# Patient Record
Sex: Female | Born: 1935 | Race: Black or African American | Hispanic: No | Marital: Married | Smoking: Current every day smoker
Health system: Southern US, Community
[De-identification: ages and names within clinical notes are randomized; demographics above are authoritative.]

## PROBLEM LIST (undated history)

## (undated) DIAGNOSIS — F32A Depression, unspecified: Secondary | ICD-10-CM

## (undated) DIAGNOSIS — I2510 Atherosclerotic heart disease of native coronary artery without angina pectoris: Secondary | ICD-10-CM

## (undated) DIAGNOSIS — K219 Gastro-esophageal reflux disease without esophagitis: Secondary | ICD-10-CM

## (undated) DIAGNOSIS — R9389 Abnormal findings on diagnostic imaging of other specified body structures: Secondary | ICD-10-CM

## (undated) DIAGNOSIS — K529 Noninfective gastroenteritis and colitis, unspecified: Secondary | ICD-10-CM

## (undated) DIAGNOSIS — G4700 Insomnia, unspecified: Principal | ICD-10-CM

## (undated) DIAGNOSIS — R1310 Dysphagia, unspecified: Secondary | ICD-10-CM

## (undated) DIAGNOSIS — F419 Anxiety disorder, unspecified: Secondary | ICD-10-CM

## (undated) DIAGNOSIS — K648 Other hemorrhoids: Secondary | ICD-10-CM

## (undated) DIAGNOSIS — J029 Acute pharyngitis, unspecified: Secondary | ICD-10-CM

## (undated) DIAGNOSIS — D472 Monoclonal gammopathy: Secondary | ICD-10-CM

## (undated) DIAGNOSIS — D649 Anemia, unspecified: Secondary | ICD-10-CM

## (undated) DIAGNOSIS — K589 Irritable bowel syndrome without diarrhea: Secondary | ICD-10-CM

## (undated) DIAGNOSIS — E538 Deficiency of other specified B group vitamins: Secondary | ICD-10-CM

## (undated) DIAGNOSIS — E785 Hyperlipidemia, unspecified: Secondary | ICD-10-CM

## (undated) DIAGNOSIS — I10 Essential (primary) hypertension: Secondary | ICD-10-CM

## (undated) DIAGNOSIS — F329 Major depressive disorder, single episode, unspecified: Secondary | ICD-10-CM

## (undated) DIAGNOSIS — M1990 Unspecified osteoarthritis, unspecified site: Secondary | ICD-10-CM

## (undated) DIAGNOSIS — R591 Generalized enlarged lymph nodes: Principal | ICD-10-CM

## (undated) DIAGNOSIS — N3941 Urge incontinence: Secondary | ICD-10-CM

## (undated) DIAGNOSIS — K2970 Gastritis, unspecified, without bleeding: Secondary | ICD-10-CM

## (undated) DIAGNOSIS — C8290 Follicular lymphoma, unspecified, unspecified site: Principal | ICD-10-CM

## (undated) HISTORY — DX: Generalized enlarged lymph nodes: R59.1

## (undated) HISTORY — DX: Noninfective gastroenteritis and colitis, unspecified: K52.9

## (undated) HISTORY — DX: Acute pharyngitis, unspecified: J02.9

## (undated) HISTORY — PX: CORONARY STENT PLACEMENT: SHX1402

## (undated) HISTORY — DX: Unspecified osteoarthritis, unspecified site: M19.90

## (undated) HISTORY — DX: Depression, unspecified: F32.A

## (undated) HISTORY — DX: Abnormal findings on diagnostic imaging of other specified body structures: R93.89

## (undated) HISTORY — DX: Essential (primary) hypertension: I10

## (undated) HISTORY — DX: Anxiety disorder, unspecified: F41.9

## (undated) HISTORY — PX: SPINE SURGERY: SHX786

## (undated) HISTORY — PX: HEMICOLECTOMY: SHX854

## (undated) HISTORY — DX: Monoclonal gammopathy: D47.2

## (undated) HISTORY — DX: Anemia, unspecified: D64.9

## (undated) HISTORY — DX: Dysphagia, unspecified: R13.10

## (undated) HISTORY — PX: OTHER SURGICAL HISTORY: SHX169

## (undated) HISTORY — DX: Follicular lymphoma, unspecified, unspecified site: C82.90

## (undated) HISTORY — DX: Hyperlipidemia, unspecified: E78.5

## (undated) HISTORY — DX: Other hemorrhoids: K64.8

## (undated) HISTORY — DX: Deficiency of other specified B group vitamins: E53.8

## (undated) HISTORY — DX: Gastro-esophageal reflux disease without esophagitis: K21.9

## (undated) HISTORY — DX: Atherosclerotic heart disease of native coronary artery without angina pectoris: I25.10

## (undated) HISTORY — DX: Gastritis, unspecified, without bleeding: K29.70

## (undated) HISTORY — DX: Urge incontinence: N39.41

## (undated) HISTORY — DX: Insomnia, unspecified: G47.00

## (undated) HISTORY — DX: Major depressive disorder, single episode, unspecified: F32.9

## (undated) HISTORY — PX: EYE SURGERY: SHX253

## (undated) HISTORY — DX: Irritable bowel syndrome, unspecified: K58.9

## (undated) SURGERY — Surgical Case
Anesthesia: *Unknown

---

## 1990-01-24 HISTORY — PX: ABDOMINAL HYSTERECTOMY: SHX81

## 1995-01-25 ENCOUNTER — Encounter (INDEPENDENT_AMBULATORY_CARE_PROVIDER_SITE_OTHER): Payer: Self-pay | Admitting: *Deleted

## 1997-06-12 ENCOUNTER — Encounter: Admission: RE | Admit: 1997-06-12 | Discharge: 1997-06-12 | Payer: Self-pay | Admitting: Family Medicine

## 1997-08-15 ENCOUNTER — Encounter: Admission: RE | Admit: 1997-08-15 | Discharge: 1997-08-15 | Payer: Self-pay | Admitting: Family Medicine

## 1998-12-08 ENCOUNTER — Encounter: Admission: RE | Admit: 1998-12-08 | Discharge: 1998-12-08 | Payer: Self-pay | Admitting: Family Medicine

## 1999-01-07 ENCOUNTER — Encounter: Admission: RE | Admit: 1999-01-07 | Discharge: 1999-01-07 | Payer: Self-pay | Admitting: Family Medicine

## 1999-01-08 ENCOUNTER — Encounter: Admission: RE | Admit: 1999-01-08 | Discharge: 1999-01-08 | Payer: Self-pay | Admitting: Sports Medicine

## 1999-01-08 ENCOUNTER — Encounter: Payer: Self-pay | Admitting: Sports Medicine

## 1999-01-14 ENCOUNTER — Encounter: Admission: RE | Admit: 1999-01-14 | Discharge: 1999-01-14 | Payer: Self-pay | Admitting: Family Medicine

## 1999-02-03 ENCOUNTER — Encounter: Admission: RE | Admit: 1999-02-03 | Discharge: 1999-02-03 | Payer: Self-pay | Admitting: Family Medicine

## 1999-02-08 ENCOUNTER — Encounter: Admission: RE | Admit: 1999-02-08 | Discharge: 1999-02-08 | Payer: Self-pay | Admitting: Family Medicine

## 1999-02-09 ENCOUNTER — Encounter: Payer: Self-pay | Admitting: Sports Medicine

## 1999-02-09 ENCOUNTER — Encounter: Admission: RE | Admit: 1999-02-09 | Discharge: 1999-02-09 | Payer: Self-pay | Admitting: Sports Medicine

## 1999-02-16 ENCOUNTER — Encounter (INDEPENDENT_AMBULATORY_CARE_PROVIDER_SITE_OTHER): Payer: Self-pay | Admitting: Specialist

## 1999-02-16 ENCOUNTER — Other Ambulatory Visit: Admission: RE | Admit: 1999-02-16 | Discharge: 1999-02-16 | Payer: Self-pay | Admitting: Internal Medicine

## 1999-04-13 ENCOUNTER — Encounter: Admission: RE | Admit: 1999-04-13 | Discharge: 1999-04-13 | Payer: Self-pay | Admitting: Family Medicine

## 1999-07-07 ENCOUNTER — Emergency Department (HOSPITAL_COMMUNITY): Admission: EM | Admit: 1999-07-07 | Discharge: 1999-07-07 | Payer: Self-pay | Admitting: Emergency Medicine

## 1999-09-09 ENCOUNTER — Encounter: Admission: RE | Admit: 1999-09-09 | Discharge: 1999-09-09 | Payer: Self-pay | Admitting: Family Medicine

## 2000-05-03 ENCOUNTER — Encounter: Admission: RE | Admit: 2000-05-03 | Discharge: 2000-05-03 | Payer: Self-pay | Admitting: Family Medicine

## 2002-02-03 ENCOUNTER — Emergency Department (HOSPITAL_COMMUNITY): Admission: EM | Admit: 2002-02-03 | Discharge: 2002-02-03 | Payer: Self-pay | Admitting: Emergency Medicine

## 2002-02-13 ENCOUNTER — Ambulatory Visit (HOSPITAL_COMMUNITY): Admission: RE | Admit: 2002-02-13 | Discharge: 2002-02-13 | Payer: Self-pay | Admitting: Internal Medicine

## 2002-03-05 ENCOUNTER — Encounter: Payer: Self-pay | Admitting: Internal Medicine

## 2002-03-05 ENCOUNTER — Encounter: Admission: RE | Admit: 2002-03-05 | Discharge: 2002-03-05 | Payer: Self-pay | Admitting: Internal Medicine

## 2002-10-27 ENCOUNTER — Emergency Department (HOSPITAL_COMMUNITY): Admission: EM | Admit: 2002-10-27 | Discharge: 2002-10-27 | Payer: Self-pay | Admitting: Emergency Medicine

## 2002-11-15 ENCOUNTER — Encounter: Payer: Self-pay | Admitting: Neurology

## 2002-11-15 ENCOUNTER — Encounter: Admission: RE | Admit: 2002-11-15 | Discharge: 2002-11-15 | Payer: Self-pay | Admitting: Neurology

## 2003-04-28 ENCOUNTER — Encounter: Admission: RE | Admit: 2003-04-28 | Discharge: 2003-04-28 | Payer: Self-pay | Admitting: Internal Medicine

## 2003-07-10 ENCOUNTER — Emergency Department (HOSPITAL_COMMUNITY): Admission: EM | Admit: 2003-07-10 | Discharge: 2003-07-10 | Payer: Self-pay | Admitting: Family Medicine

## 2003-08-06 ENCOUNTER — Emergency Department (HOSPITAL_COMMUNITY): Admission: EM | Admit: 2003-08-06 | Discharge: 2003-08-06 | Payer: Self-pay | Admitting: Family Medicine

## 2003-12-01 ENCOUNTER — Ambulatory Visit: Payer: Self-pay | Admitting: Internal Medicine

## 2004-04-28 ENCOUNTER — Encounter: Admission: RE | Admit: 2004-04-28 | Discharge: 2004-04-28 | Payer: Self-pay | Admitting: Internal Medicine

## 2004-05-17 ENCOUNTER — Ambulatory Visit: Payer: Self-pay | Admitting: Internal Medicine

## 2004-05-21 ENCOUNTER — Ambulatory Visit: Payer: Self-pay | Admitting: Family Medicine

## 2004-05-24 ENCOUNTER — Ambulatory Visit: Payer: Self-pay | Admitting: Internal Medicine

## 2004-05-31 ENCOUNTER — Ambulatory Visit: Payer: Self-pay | Admitting: Sports Medicine

## 2004-06-30 ENCOUNTER — Ambulatory Visit: Payer: Self-pay | Admitting: Family Medicine

## 2004-08-02 ENCOUNTER — Encounter: Admission: RE | Admit: 2004-08-02 | Discharge: 2004-10-31 | Payer: Self-pay | Admitting: Specialist

## 2004-08-24 ENCOUNTER — Ambulatory Visit: Payer: Self-pay | Admitting: Family Medicine

## 2004-09-16 ENCOUNTER — Ambulatory Visit: Payer: Self-pay | Admitting: Family Medicine

## 2004-09-21 ENCOUNTER — Ambulatory Visit: Payer: Self-pay | Admitting: Sports Medicine

## 2004-09-30 ENCOUNTER — Ambulatory Visit: Payer: Self-pay | Admitting: Family Medicine

## 2004-12-01 ENCOUNTER — Ambulatory Visit: Payer: Self-pay | Admitting: Internal Medicine

## 2004-12-09 ENCOUNTER — Ambulatory Visit (HOSPITAL_COMMUNITY): Admission: RE | Admit: 2004-12-09 | Discharge: 2004-12-09 | Payer: Self-pay | Admitting: Cardiovascular Disease

## 2005-05-02 ENCOUNTER — Encounter: Admission: RE | Admit: 2005-05-02 | Discharge: 2005-05-02 | Payer: Self-pay | Admitting: Internal Medicine

## 2005-07-11 ENCOUNTER — Encounter: Admission: RE | Admit: 2005-07-11 | Discharge: 2005-07-11 | Payer: Self-pay | Admitting: Cardiovascular Disease

## 2005-07-21 ENCOUNTER — Encounter: Admission: RE | Admit: 2005-07-21 | Discharge: 2005-08-15 | Payer: Self-pay | Admitting: Cardiovascular Disease

## 2005-08-04 ENCOUNTER — Ambulatory Visit: Payer: Self-pay | Admitting: Internal Medicine

## 2005-08-23 ENCOUNTER — Encounter: Admission: RE | Admit: 2005-08-23 | Discharge: 2005-08-30 | Payer: Self-pay | Admitting: Cardiovascular Disease

## 2005-11-22 ENCOUNTER — Ambulatory Visit: Payer: Self-pay | Admitting: Internal Medicine

## 2006-03-08 ENCOUNTER — Encounter: Admission: RE | Admit: 2006-03-08 | Discharge: 2006-03-08 | Payer: Self-pay | Admitting: Neurology

## 2006-03-14 ENCOUNTER — Ambulatory Visit: Payer: Self-pay | Admitting: Internal Medicine

## 2006-03-21 ENCOUNTER — Encounter: Admission: RE | Admit: 2006-03-21 | Discharge: 2006-06-19 | Payer: Self-pay | Admitting: Neurology

## 2006-03-23 DIAGNOSIS — F329 Major depressive disorder, single episode, unspecified: Secondary | ICD-10-CM

## 2006-03-23 DIAGNOSIS — I10 Essential (primary) hypertension: Secondary | ICD-10-CM | POA: Insufficient documentation

## 2006-03-23 DIAGNOSIS — F17200 Nicotine dependence, unspecified, uncomplicated: Secondary | ICD-10-CM | POA: Insufficient documentation

## 2006-03-23 DIAGNOSIS — N3941 Urge incontinence: Secondary | ICD-10-CM | POA: Insufficient documentation

## 2006-03-23 DIAGNOSIS — Z720 Tobacco use: Secondary | ICD-10-CM | POA: Insufficient documentation

## 2006-03-23 DIAGNOSIS — M1990 Unspecified osteoarthritis, unspecified site: Secondary | ICD-10-CM | POA: Insufficient documentation

## 2006-03-23 DIAGNOSIS — F419 Anxiety disorder, unspecified: Secondary | ICD-10-CM | POA: Insufficient documentation

## 2006-03-23 DIAGNOSIS — F32A Depression, unspecified: Secondary | ICD-10-CM | POA: Insufficient documentation

## 2006-03-23 DIAGNOSIS — H9190 Unspecified hearing loss, unspecified ear: Secondary | ICD-10-CM | POA: Insufficient documentation

## 2006-03-23 DIAGNOSIS — F411 Generalized anxiety disorder: Secondary | ICD-10-CM | POA: Insufficient documentation

## 2006-03-24 ENCOUNTER — Encounter (INDEPENDENT_AMBULATORY_CARE_PROVIDER_SITE_OTHER): Payer: Self-pay | Admitting: *Deleted

## 2006-03-28 ENCOUNTER — Ambulatory Visit: Payer: Self-pay | Admitting: Internal Medicine

## 2006-05-08 ENCOUNTER — Ambulatory Visit: Payer: Self-pay | Admitting: Oncology

## 2006-05-09 LAB — CBC WITH DIFFERENTIAL/PLATELET
Basophils Absolute: 0 10*3/uL (ref 0.0–0.1)
EOS%: 2.6 % (ref 0.0–7.0)
HGB: 13.1 g/dL (ref 11.6–15.9)
MCH: 31.7 pg (ref 26.0–34.0)
MCV: 91 fL (ref 81.0–101.0)
MONO%: 7.8 % (ref 0.0–13.0)
NEUT%: 47.5 % (ref 39.6–76.8)
RDW: 13.8 % (ref 11.3–14.5)

## 2006-05-10 ENCOUNTER — Encounter: Admission: RE | Admit: 2006-05-10 | Discharge: 2006-05-10 | Payer: Self-pay | Admitting: Internal Medicine

## 2006-05-11 LAB — IMMUNOFIXATION ELECTROPHORESIS
IgA: 364 mg/dL (ref 68–378)
IgM, Serum: 277 mg/dL — ABNORMAL HIGH (ref 60–263)

## 2006-05-11 LAB — COMPREHENSIVE METABOLIC PANEL
AST: 15 U/L (ref 0–37)
Alkaline Phosphatase: 89 U/L (ref 39–117)
BUN: 13 mg/dL (ref 6–23)
Creatinine, Ser: 0.82 mg/dL (ref 0.40–1.20)

## 2006-05-16 LAB — UIFE/LIGHT CHAINS/TP QN, 24-HR UR
Free Kappa Lt Chains,Ur: 3.16 mg/dL — ABNORMAL HIGH (ref 0.04–1.51)
Free Lambda Excretion/Day: 7.91 mg/d
Free Lambda Lt Chains,Ur: 0.51 mg/dL (ref 0.08–1.01)
Time: 24 hours
Volume, Urine: 1550 mL

## 2006-05-16 LAB — CREATININE CLEARANCE, URINE, 24 HOUR
Creatinine Clearance: 99 mL/min (ref 75–115)
Creatinine, 24H Ur: 1227 mg/d (ref 700–1800)
Creatinine, Urine: 79.1 mg/dL
Creatinine: 0.86 mg/dL (ref 0.40–1.20)
Urine Total Volume-CRCL: 1550 mL

## 2006-11-28 ENCOUNTER — Ambulatory Visit: Payer: Self-pay | Admitting: Internal Medicine

## 2006-12-08 ENCOUNTER — Ambulatory Visit: Payer: Self-pay | Admitting: Oncology

## 2006-12-13 LAB — COMPREHENSIVE METABOLIC PANEL
ALT: 11 U/L (ref 0–35)
AST: 14 U/L (ref 0–37)
Albumin: 4.1 g/dL (ref 3.5–5.2)
Alkaline Phosphatase: 69 U/L (ref 39–117)
BUN: 12 mg/dL (ref 6–23)
Calcium: 9.1 mg/dL (ref 8.4–10.5)
Chloride: 103 mEq/L (ref 96–112)
Potassium: 4.3 mEq/L (ref 3.5–5.3)
Sodium: 139 mEq/L (ref 135–145)
Total Protein: 7.6 g/dL (ref 6.0–8.3)

## 2006-12-13 LAB — CBC WITH DIFFERENTIAL/PLATELET
BASO%: 0.3 % (ref 0.0–2.0)
Basophils Absolute: 0 10*3/uL (ref 0.0–0.1)
EOS%: 1.1 % (ref 0.0–7.0)
HGB: 13.7 g/dL (ref 11.6–15.9)
MCH: 32.5 pg (ref 26.0–34.0)
MCHC: 34.4 g/dL (ref 32.0–36.0)
MCV: 94.3 fL (ref 81.0–101.0)
MONO%: 7.1 % (ref 0.0–13.0)
RBC: 4.23 10*6/uL (ref 3.70–5.32)
RDW: 14.3 % (ref 11.3–14.5)
lymph#: 2.2 10*3/uL (ref 0.9–3.3)

## 2006-12-13 LAB — IGG, IGA, IGM
IgA: 362 mg/dL (ref 68–378)
IgM, Serum: 272 mg/dL — ABNORMAL HIGH (ref 60–263)

## 2007-01-01 ENCOUNTER — Encounter (INDEPENDENT_AMBULATORY_CARE_PROVIDER_SITE_OTHER): Payer: Self-pay | Admitting: Family Medicine

## 2007-01-01 DIAGNOSIS — D472 Monoclonal gammopathy: Secondary | ICD-10-CM | POA: Insufficient documentation

## 2007-02-09 ENCOUNTER — Ambulatory Visit: Payer: Self-pay | Admitting: Family Medicine

## 2007-02-16 ENCOUNTER — Encounter (INDEPENDENT_AMBULATORY_CARE_PROVIDER_SITE_OTHER): Payer: Self-pay | Admitting: Family Medicine

## 2007-03-19 DIAGNOSIS — G43909 Migraine, unspecified, not intractable, without status migrainosus: Secondary | ICD-10-CM | POA: Insufficient documentation

## 2007-03-19 DIAGNOSIS — K2980 Duodenitis without bleeding: Secondary | ICD-10-CM | POA: Insufficient documentation

## 2007-03-19 DIAGNOSIS — K589 Irritable bowel syndrome without diarrhea: Secondary | ICD-10-CM | POA: Insufficient documentation

## 2007-03-19 DIAGNOSIS — R197 Diarrhea, unspecified: Secondary | ICD-10-CM | POA: Insufficient documentation

## 2007-03-19 DIAGNOSIS — K648 Other hemorrhoids: Secondary | ICD-10-CM | POA: Insufficient documentation

## 2007-03-19 DIAGNOSIS — K219 Gastro-esophageal reflux disease without esophagitis: Secondary | ICD-10-CM | POA: Insufficient documentation

## 2007-03-19 DIAGNOSIS — K2990 Gastroduodenitis, unspecified, without bleeding: Secondary | ICD-10-CM

## 2007-03-19 DIAGNOSIS — K2970 Gastritis, unspecified, without bleeding: Secondary | ICD-10-CM | POA: Insufficient documentation

## 2007-04-30 ENCOUNTER — Ambulatory Visit: Payer: Self-pay | Admitting: Internal Medicine

## 2007-05-16 ENCOUNTER — Encounter: Admission: RE | Admit: 2007-05-16 | Discharge: 2007-05-16 | Payer: Self-pay | Admitting: Internal Medicine

## 2007-05-21 ENCOUNTER — Encounter: Admission: RE | Admit: 2007-05-21 | Discharge: 2007-05-21 | Payer: Self-pay | Admitting: Internal Medicine

## 2007-06-12 ENCOUNTER — Ambulatory Visit: Payer: Self-pay | Admitting: Oncology

## 2007-06-14 ENCOUNTER — Telehealth: Payer: Self-pay | Admitting: Internal Medicine

## 2007-06-27 ENCOUNTER — Telehealth: Payer: Self-pay | Admitting: Internal Medicine

## 2007-07-16 ENCOUNTER — Telehealth: Payer: Self-pay | Admitting: Internal Medicine

## 2007-08-15 ENCOUNTER — Encounter: Payer: Self-pay | Admitting: Internal Medicine

## 2007-09-14 ENCOUNTER — Encounter: Payer: Self-pay | Admitting: Internal Medicine

## 2007-09-25 ENCOUNTER — Emergency Department (HOSPITAL_COMMUNITY): Admission: EM | Admit: 2007-09-25 | Discharge: 2007-09-25 | Payer: Self-pay | Admitting: Family Medicine

## 2007-09-27 ENCOUNTER — Telehealth: Payer: Self-pay | Admitting: *Deleted

## 2007-10-02 ENCOUNTER — Encounter: Payer: Self-pay | Admitting: Internal Medicine

## 2007-10-08 ENCOUNTER — Encounter: Payer: Self-pay | Admitting: Internal Medicine

## 2007-10-12 ENCOUNTER — Encounter: Payer: Self-pay | Admitting: Internal Medicine

## 2007-10-15 ENCOUNTER — Telehealth: Payer: Self-pay | Admitting: Internal Medicine

## 2007-10-21 ENCOUNTER — Encounter (INDEPENDENT_AMBULATORY_CARE_PROVIDER_SITE_OTHER): Payer: Self-pay | Admitting: *Deleted

## 2007-10-25 ENCOUNTER — Ambulatory Visit: Payer: Self-pay | Admitting: Internal Medicine

## 2007-10-25 DIAGNOSIS — E538 Deficiency of other specified B group vitamins: Secondary | ICD-10-CM | POA: Insufficient documentation

## 2007-10-25 DIAGNOSIS — R933 Abnormal findings on diagnostic imaging of other parts of digestive tract: Secondary | ICD-10-CM | POA: Insufficient documentation

## 2007-10-26 ENCOUNTER — Encounter (INDEPENDENT_AMBULATORY_CARE_PROVIDER_SITE_OTHER): Payer: Self-pay | Admitting: Family Medicine

## 2007-10-26 LAB — CONVERTED CEMR LAB
ALT: 12 units/L (ref 0–35)
CO2: 31 meq/L (ref 19–32)
Calcium: 9.1 mg/dL (ref 8.4–10.5)
Eosinophils Absolute: 0.2 10*3/uL (ref 0.0–0.7)
GFR calc Af Amer: 80 mL/min
GFR calc non Af Amer: 66 mL/min
Glucose, Bld: 86 mg/dL (ref 70–99)
HCT: 39.3 % (ref 36.0–46.0)
IgA: 341 mg/dL (ref 68–378)
IgG (Immunoglobin G), Serum: 1377 mg/dL (ref 694–1618)
LDH: 203 units/L (ref 94–250)
Lymphocytes Relative: 44.4 % (ref 12.0–46.0)
MCHC: 34.4 g/dL (ref 30.0–36.0)
Neutro Abs: 2.5 10*3/uL (ref 1.4–7.7)
Neutrophils Relative %: 45.1 % (ref 43.0–77.0)
Platelets: 192 10*3/uL (ref 150–400)
Potassium: 4.4 meq/L (ref 3.5–5.1)
RBC: 4.14 M/uL (ref 3.87–5.11)
Total Bilirubin: 0.7 mg/dL (ref 0.3–1.2)
WBC: 5.5 10*3/uL (ref 4.5–10.5)

## 2007-11-01 ENCOUNTER — Telehealth: Payer: Self-pay | Admitting: Internal Medicine

## 2007-11-07 ENCOUNTER — Ambulatory Visit: Payer: Self-pay | Admitting: Oncology

## 2007-11-08 ENCOUNTER — Ambulatory Visit (HOSPITAL_COMMUNITY): Admission: RE | Admit: 2007-11-08 | Discharge: 2007-11-08 | Payer: Self-pay | Admitting: Oncology

## 2007-11-09 LAB — COMPREHENSIVE METABOLIC PANEL
AST: 12 U/L (ref 0–37)
Albumin: 4 g/dL (ref 3.5–5.2)
BUN: 12 mg/dL (ref 6–23)
CO2: 26 mEq/L (ref 19–32)
Calcium: 9.4 mg/dL (ref 8.4–10.5)
Chloride: 106 mEq/L (ref 96–112)
Glucose, Bld: 124 mg/dL — ABNORMAL HIGH (ref 70–99)
Potassium: 4.3 mEq/L (ref 3.5–5.3)

## 2007-11-09 LAB — CBC WITH DIFFERENTIAL/PLATELET
Basophils Absolute: 0 10*3/uL (ref 0.0–0.1)
EOS%: 2.9 % (ref 0.0–7.0)
Eosinophils Absolute: 0.1 10*3/uL (ref 0.0–0.5)
HCT: 40.3 % (ref 34.8–46.6)
HGB: 13.8 g/dL (ref 11.6–15.9)
MONO#: 0.4 10*3/uL (ref 0.1–0.9)
NEUT#: 2.4 10*3/uL (ref 1.5–6.5)
RDW: 13.5 % (ref 11.3–14.5)
WBC: 4.4 10*3/uL (ref 3.9–10.0)
lymph#: 1.5 10*3/uL (ref 0.9–3.3)

## 2007-11-09 LAB — LACTATE DEHYDROGENASE: LDH: 163 U/L (ref 94–250)

## 2007-11-09 LAB — IGG, IGA, IGM: IgA: 285 mg/dL (ref 68–378)

## 2007-11-12 ENCOUNTER — Encounter: Payer: Self-pay | Admitting: Internal Medicine

## 2007-11-20 ENCOUNTER — Ambulatory Visit (HOSPITAL_COMMUNITY): Admission: RE | Admit: 2007-11-20 | Discharge: 2007-11-20 | Payer: Self-pay | Admitting: Oncology

## 2007-11-30 ENCOUNTER — Encounter: Payer: Self-pay | Admitting: Internal Medicine

## 2008-01-09 ENCOUNTER — Ambulatory Visit: Payer: Self-pay | Admitting: Oncology

## 2008-01-09 ENCOUNTER — Encounter: Payer: Self-pay | Admitting: Internal Medicine

## 2008-02-01 ENCOUNTER — Encounter: Payer: Self-pay | Admitting: Internal Medicine

## 2008-02-08 ENCOUNTER — Encounter: Payer: Self-pay | Admitting: Family Medicine

## 2008-02-08 DIAGNOSIS — J309 Allergic rhinitis, unspecified: Secondary | ICD-10-CM | POA: Insufficient documentation

## 2008-02-15 ENCOUNTER — Encounter (INDEPENDENT_AMBULATORY_CARE_PROVIDER_SITE_OTHER): Payer: Self-pay | Admitting: Family Medicine

## 2008-02-19 ENCOUNTER — Ambulatory Visit: Payer: Self-pay | Admitting: Family Medicine

## 2008-02-19 DIAGNOSIS — S61409A Unspecified open wound of unspecified hand, initial encounter: Secondary | ICD-10-CM | POA: Insufficient documentation

## 2008-02-22 LAB — CBC WITH DIFFERENTIAL/PLATELET
Basophils Absolute: 0 10*3/uL (ref 0.0–0.1)
HCT: 38.7 % (ref 34.8–46.6)
HGB: 13.3 g/dL (ref 11.6–15.9)
LYMPH%: 42.4 % (ref 14.0–48.0)
MCH: 32.6 pg (ref 26.0–34.0)
MCHC: 34.3 g/dL (ref 32.0–36.0)
MONO#: 0.4 10*3/uL (ref 0.1–0.9)
NEUT%: 43.6 % (ref 39.6–76.8)
Platelets: 183 10*3/uL (ref 145–400)
lymph#: 2.2 10*3/uL (ref 0.9–3.3)

## 2008-02-22 LAB — COMPREHENSIVE METABOLIC PANEL
Albumin: 3.6 g/dL (ref 3.5–5.2)
BUN: 11 mg/dL (ref 6–23)
Calcium: 8.7 mg/dL (ref 8.4–10.5)
Chloride: 103 mEq/L (ref 96–112)
Creatinine, Ser: 0.78 mg/dL (ref 0.40–1.20)
Glucose, Bld: 91 mg/dL (ref 70–99)
Potassium: 3.7 mEq/L (ref 3.5–5.3)

## 2008-02-25 ENCOUNTER — Encounter (INDEPENDENT_AMBULATORY_CARE_PROVIDER_SITE_OTHER): Payer: Self-pay | Admitting: *Deleted

## 2008-02-25 ENCOUNTER — Ambulatory Visit (HOSPITAL_COMMUNITY): Admission: RE | Admit: 2008-02-25 | Discharge: 2008-02-25 | Payer: Self-pay | Admitting: Oncology

## 2008-02-26 ENCOUNTER — Ambulatory Visit: Payer: Self-pay | Admitting: Oncology

## 2008-02-28 ENCOUNTER — Encounter: Payer: Self-pay | Admitting: Internal Medicine

## 2008-03-12 ENCOUNTER — Telehealth: Payer: Self-pay | Admitting: Internal Medicine

## 2008-03-13 ENCOUNTER — Telehealth: Payer: Self-pay | Admitting: Internal Medicine

## 2008-03-19 ENCOUNTER — Encounter: Admission: RE | Admit: 2008-03-19 | Discharge: 2008-03-19 | Payer: Self-pay | Admitting: Cardiovascular Disease

## 2008-05-19 ENCOUNTER — Encounter: Payer: Self-pay | Admitting: Internal Medicine

## 2008-05-22 ENCOUNTER — Ambulatory Visit: Payer: Self-pay | Admitting: Oncology

## 2008-10-07 ENCOUNTER — Telehealth: Payer: Self-pay | Admitting: Internal Medicine

## 2008-10-15 ENCOUNTER — Ambulatory Visit: Payer: Self-pay | Admitting: Internal Medicine

## 2008-11-04 ENCOUNTER — Telehealth: Payer: Self-pay | Admitting: Internal Medicine

## 2009-05-05 ENCOUNTER — Encounter: Admission: RE | Admit: 2009-05-05 | Discharge: 2009-05-05 | Payer: Self-pay | Admitting: Cardiovascular Disease

## 2009-05-18 ENCOUNTER — Encounter: Admission: RE | Admit: 2009-05-18 | Discharge: 2009-05-18 | Payer: Self-pay | Admitting: Cardiovascular Disease

## 2009-07-29 ENCOUNTER — Telehealth: Payer: Self-pay | Admitting: Internal Medicine

## 2009-07-31 ENCOUNTER — Ambulatory Visit: Payer: Self-pay | Admitting: Internal Medicine

## 2009-08-12 ENCOUNTER — Ambulatory Visit (HOSPITAL_COMMUNITY): Admission: RE | Admit: 2009-08-12 | Discharge: 2009-08-12 | Payer: Self-pay | Admitting: Cardiovascular Disease

## 2010-02-14 ENCOUNTER — Encounter: Payer: Self-pay | Admitting: Orthopedic Surgery

## 2010-02-23 NOTE — Assessment & Plan Note (Signed)
Summary: DIARRHEA, CRAMPING AND HEMORRHOIDS            [name]    History of Present Illness Visit Type: Follow-up Visit Primary GI MD: [name] MD Primary Provider: Queen Blossom, MD Chief Complaint: Lower abd cramping and diarrhea x 20 years. Pt states the main reason she is here is for depression. Pt states she has no energy. History of Present Illness:   This is a 75 year old African American female with irritable bowel syndrome and chronic diarrhea attributed to a prior ilial resection and right hemicolectomy after an incidental perforation of the bowel during a gynecological procedure in 1982. Her last colonoscopy in February 2008 was normal. A recall colonoscopy will be due in 2018. She has IgG kappa monoclonal gammopathy diagnosed in May 2008. She has chronic elevation of her alkaline phosphatase but her liver function tests have been normal. A CT Scan of the abdomen in September 2009 showed multiple enlarged lymph nodes in the mesentery with some fluid collection. A followup CT scan in February 2010 showed retroperitoneal and mesenteric adenopathy similar to the one in October of 2009. She takes Questran and Lomotil for diarrhea. Her main complaint today is depression.   GI Review of Systems    Reports abdominal pain and  acid reflux.     Location of  Abdominal pain: lower abdomen.    Denies belching, bloating, chest pain, dysphagia with liquids, dysphagia with solids, heartburn, loss of appetite, nausea, vomiting, vomiting blood, weight loss, and  weight gain.      Reports diarrhea.     Denies anal fissure, black tarry stools, change in bowel habit, constipation, diverticulosis, fecal incontinence, heme positive stool, hemorrhoids, irritable bowel syndrome, jaundice, light color stool, liver problems, rectal bleeding, and  rectal pain.    Current Medications (verified): 1)  Cholestyramine 4 Gm/dose Powd (Cholestyramine) .... Take 4 Gram Dissolved in Water Three Times A Day 2)   Lorazepam 2 Mg Tabs (Lorazepam) .... Take 1 Tablet By Mouth Three Times A Day. Not To Be Filled Until 06/16/09 3)  Aspirin Low Dose 81 Mg  Tabs (Aspirin) .... One Tablet By Mouth Daily 4)  Zocor 40 Mg  Tabs (Simvastatin) .... One Tablet By Mouth Daily Every Other Day (Cardiology) 5)  Ditropan Xl 5 Mg  Tb24 (Oxybutynin Chloride) .... Take One Tablet By Mouth Daily Prn 6)  Protonix 40 Mg  Tbec (Pantoprazole Sodium) .[name].. 1 Each Day 30 Minutes Before Breakfast. 7)  Lomotil 2.5-0.025 Mg  Tabs (Diphenoxylate-Atropine) .... Take One Daily As Needed 8)  Hydrocodone-Acetaminophen 5-500 Mg  Tabs (Hydrocodone-Acetaminophen) .... Take One Tablet Daiy As Needed Pain 9)  Potassium Chloride Cr 10 Meq Cr-Caps (Potassium Chloride) .... 2 Tablets By Mouth Every Other Day 10)  Diclofenac Sodium Cr 100 Mg Xr24h-Tab (Diclofenac Sodium) .... One Tablet Daily For Pain - Dr. Algie Coffer 11)  Nitrolingual 0.4 Mg/spray Soln (Nitroglycerin) .... One Spray Every 5 Minutes 3 Times As Needed For Cp Algie Coffer)  Allergies (verified): No Known Drug Allergies  Past History:  Past Medical History: Reviewed history from 03/19/2007 and no changes required. benign bladder neck polyps, chronic diarrhea ?IBS, having pelvic surgery, s/p arthroscopic knee surgery, s/p brain surgery unknown reason, s/p colon resection sec to bowel perforation whil Current Problems:  DUODENITIS (ICD-535.60) GASTRITIS (ICD-535.50) GERD (ICD-530.81) HEADACHE (ICD-784.0) DIARRHEA, CHRONIC (ICD-787.91) IBS (ICD-564.1) HEMORRHOIDS, INTERNAL (ICD-455.0) MONOCLONAL GAMMOPATHY (ICD-273.1) TOBACCO DEPENDENCE (ICD-305.1) INCONTINENCE, URGE (ICD-788.31) HYPERTENSION, BENIGN SYSTEMIC (ICD-401.1) HEARING LOSS NOS OR DEAFNESS (ICD-389.9) DJD, UNSPECIFIED (ICD-715.90) DEPRESSIVE DISORDER, NOS (  ICD-311) ANXIETY (ICD-300.00)  Past Surgical History: Reviewed history from 10/19/2007 and no changes required. cystoscopy: benign bladder neck polyps 08/06 - 08/24/2004,   hysterectomy/ovarian cystectomy 1992 -,  knee corticosteroid injection Bilateral - 09/24/2004 right hemicolectomy ileocolic anastomosis-1982 Spinal surgery  Family History: Reviewed history from 02/09/2007 and no changes required. Fath-- died `old age`, Moth-- died ESRD, htn, cva, Siblings--  DM Granddaugther died unexpectantly at age 36 yo (2008)  Social History: Reviewed history from 10/19/2007 and no changes required. married lives with husband.   Retired from [employer].   Smokes 1/2 ppd x 42 year Alcohol Use - no Illicit Drug Use - no Daily Caffeine Use  Review of Systems       The patient complains of depression-new and fatigue.  The patient denies allergy/sinus, anemia, anxiety-new, arthritis/joint pain, back pain, blood in urine, breast changes/lumps, change in vision, confusion, cough, coughing up blood, fainting, fever, headaches-new, hearing problems, heart murmur, heart rhythm changes, itching, menstrual pain, muscle pains/cramps, night sweats, nosebleeds, pregnancy symptoms, shortness of breath, skin rash, sleeping problems, sore throat, swelling of feet/legs, swollen lymph glands, thirst - excessive , urination - excessive , urination changes/pain, urine leakage, vision changes, and voice change.         Pertinent positive and negative review of systems were noted in the above HPI. All other ROS was otherwise negative.   Vital Signs:  Patient profile:   75 year old female Height:      64 inches Weight:      145.25 pounds BMI:     25.02 Pulse rate:   80 / minute Pulse rhythm:   regular BP sitting:   110 / 56  (right arm) Cuff size:   regular  Vitals Entered By: Christie Nottingham CMA Duncan Dull) (July 31, 2009 9:58 AM)  Physical Exam  General:  alert, oriented and depressed appearing. Eyes:   nonicteric Mouth:  No deformity or lesions, dentition normal. Neck:  Supple; no masses or thyromegaly. Lungs:  Clear throughout to auscultation. Heart:  Regular rate and  rhythm; no murmurs, rubs,  or bruits. Abdomen:  well-healed surgical scars in midline, soft abdomen with normoactive bowel sounds and no focal tenderness. Rectal:  soft Hemoccult negative stool. Extremities:  No clubbing, cyanosis, edema or deformities noted. Skin:  Intact without significant lesions or rashes. Psych:  Alert and cooperative. Normal mood and affect.   Impression & Recommendations:  Problem # 1:  DIARRHEA, CHRONIC (ICD-787.91) Patient has chronic diarrhea controlled on Questran and antidiarrheal agents. It is partly due to the  right hemicolectomy and partly due to irritable bowel syndrome. She is to continue on her current medications. A recall colonoscopy will be due in 2018.  Problem # 2:  MONOCLONAL GAMMOPATHY (ICD-273.1) Patient is followed by hematology.  Problem # 3:  HEMORRHOIDS, INTERNAL (ICD-455.0) Samples of Analpram cream have been given to patient.  Problem # 4:  ANXIETY (ICD-300.00) Patient has chronic anxiety and depression. She needs a refill on Ativan 2 mg p.o. t.i.d. and we will start her on Zoloft 50 mg at bedtime.  Patient Instructions: 1)  Zoloft 50 mg p.o. q.h.s. 2)  Continue Questran and Lomotil for intermittent diarrhea. 3)  Colonoscopy 2018. 4)  Office visit in one year. 5)  Copy sent to : Dr Algie Coffer, Dr Arline Asp, Dr Ellin Mayhew 6)  The medication list was reviewed and reconciled.  All changed / newly prescribed medications were explained.  A complete medication list was provided to the patient / caregiver. Prescriptions:  ZOLOFT 50 MG TABS (SERTRALINE HCL) Take 1 tablet by mouth once a day  #30 x 3   Entered by:   Lamona Curl CMA (AAMA)   Authorized by:   Hart Carwin MD   Signed by:   Lamona Curl CMA (AAMA) on 07/31/2009   Method used:   Electronically to        [facility] [phone]* (retail)       [address]       [address]       [place], [place]  [address]       Ph: [phone]       Fax: [phone]   RxID:    [id number] LORAZEPAM 2 MG TABS (LORAZEPAM) Take 1 tablet by mouth three times a day.  NOT TO BE FILLED UNTIL 07/24/09  #90 x 1   Entered by:   Lamona Curl CMA (AAMA)   Authorized by:   Hart Carwin MD   Signed by:   Lamona Curl CMA (AAMA) on 07/31/2009   Method used:   Printed then faxed to ...       [facility] [phone]* (retail)       [address]       [address]       [place], [place]  [address]       Ph: [phone]       Fax: [phone]   RxID:   [id number]

## 2010-02-23 NOTE — Miscellaneous (Signed)
Summary: med list update  Clinical Lists Changes  Medications: Added new medication of ASPIRIN LOW DOSE 81 MG  TABS (ASPIRIN) One tablet by mouth daily Added new medication of ZOCOR 40 MG  TABS (SIMVASTATIN) One tablet by mouth daily (cardiology) Added new medication of TOPROL XL 50 MG  TB24 (METOPROLOL SUCCINATE) One tablet by mouth daily (cardiology) Added new medication of DITROPAN XL 5 MG  TB24 (OXYBUTYNIN CHLORIDE) Take one tablet by mouth daily prn Added new medication of NEXIUM 40 MG  CPDR (ESOMEPRAZOLE MAGNESIUM) Take one tablet by mouth daily Added new medication of LOMOTIL 2.5-0.025 MG  TABS (DIPHENOXYLATE-ATROPINE) Take one daily as needed Added new medication of HYDROCODONE-ACETAMINOPHEN 5-500 MG  TABS (HYDROCODONE-ACETAMINOPHEN) Take one tablet daiy as needed pain

## 2010-02-23 NOTE — Progress Notes (Signed)
Summary: triage   Phone Note Call from Patient Call back at Home Phone [phone] Call back at [phone]   Caller: Patient Call For: Fatimah Sundquist Reason for Call: Talk to Nurse Summary of Call: Patient wants to be seen before first available 8-10 for diarrhea and stomach pain. Initial call taken by: Tawni Levy,  July 29, 2009 4:00 PM  Follow-up for Phone Call        Pt. c/o episodes of diarrhea  and abd. cramping. Also flare-up of hemorrhoids when she has diarrhea. Ues Lomotil and Questran. Wants to see Dr.Jarius Dieudonne this week. Pt. will see Dr.Gedalya Jim on 07-31-09 at 9:30am.  Follow-up by: Laureen Ochs LPN,  July 30, 4694 4:20 PM

## 2010-03-05 ENCOUNTER — Observation Stay (HOSPITAL_COMMUNITY)
Admission: RE | Admit: 2010-03-05 | Discharge: 2010-03-07 | Disposition: A | Discharge status: Home / Self Care | Payer: MEDICARE | Source: Ambulatory Visit | Attending: Cardiovascular Disease | Admitting: Cardiovascular Disease

## 2010-03-05 DIAGNOSIS — Z87891 Personal history of nicotine dependence: Secondary | ICD-10-CM | POA: Insufficient documentation

## 2010-03-05 DIAGNOSIS — Z01812 Encounter for preprocedural laboratory examination: Secondary | ICD-10-CM | POA: Insufficient documentation

## 2010-03-05 DIAGNOSIS — E119 Type 2 diabetes mellitus without complications: Secondary | ICD-10-CM | POA: Insufficient documentation

## 2010-03-05 DIAGNOSIS — K219 Gastro-esophageal reflux disease without esophagitis: Secondary | ICD-10-CM | POA: Insufficient documentation

## 2010-03-05 DIAGNOSIS — I200 Unstable angina: Secondary | ICD-10-CM | POA: Insufficient documentation

## 2010-03-05 DIAGNOSIS — Z01810 Encounter for preprocedural cardiovascular examination: Secondary | ICD-10-CM | POA: Insufficient documentation

## 2010-03-05 DIAGNOSIS — D649 Anemia, unspecified: Secondary | ICD-10-CM | POA: Insufficient documentation

## 2010-03-05 DIAGNOSIS — I10 Essential (primary) hypertension: Secondary | ICD-10-CM | POA: Insufficient documentation

## 2010-03-05 DIAGNOSIS — F411 Generalized anxiety disorder: Secondary | ICD-10-CM | POA: Insufficient documentation

## 2010-03-05 DIAGNOSIS — I2510 Atherosclerotic heart disease of native coronary artery without angina pectoris: Principal | ICD-10-CM | POA: Insufficient documentation

## 2010-03-05 DIAGNOSIS — E7800 Pure hypercholesterolemia, unspecified: Secondary | ICD-10-CM | POA: Insufficient documentation

## 2010-03-05 DIAGNOSIS — M1990 Unspecified osteoarthritis, unspecified site: Secondary | ICD-10-CM | POA: Insufficient documentation

## 2010-03-05 LAB — CK TOTAL AND CKMB (NOT AT ARMC): Total CK: 234 U/L — ABNORMAL HIGH (ref 7–177)

## 2010-03-05 LAB — PLATELET INHIBITION P2Y12: P2Y12 % Inhibition: 61 %

## 2010-03-06 LAB — CBC
Hemoglobin: 10.9 g/dL — ABNORMAL LOW (ref 12.0–15.0)
MCH: 31.6 pg (ref 26.0–34.0)
MCHC: 33.3 g/dL (ref 30.0–36.0)

## 2010-03-06 LAB — BASIC METABOLIC PANEL
BUN: 8 mg/dL (ref 6–23)
Chloride: 112 mEq/L (ref 96–112)
Creatinine, Ser: 0.83 mg/dL (ref 0.4–1.2)

## 2010-03-06 LAB — GLUCOSE, CAPILLARY
Glucose-Capillary: 99 mg/dL (ref 70–99)
Glucose-Capillary: 125 mg/dL — ABNORMAL HIGH (ref 70–99)
Glucose-Capillary: 104 mg/dL — ABNORMAL HIGH (ref 70–99)

## 2010-03-06 LAB — CARDIAC PANEL(CRET KIN+CKTOT+MB+TROPI): Troponin I: 1.68 ng/mL (ref 0.00–0.06)

## 2010-03-07 LAB — CARDIAC PANEL(CRET KIN+CKTOT+MB+TROPI)
CK, MB: 9.9 ng/mL (ref 0.3–4.0)
Total CK: 440 U/L — ABNORMAL HIGH (ref 7–177)

## 2010-03-07 LAB — HEMOGLOBIN A1C: Mean Plasma Glucose: 108 mg/dL (ref <117)

## 2010-03-10 NOTE — Discharge Summary (Signed)
NAMESKILA, [name]                ACCOUNT NO.:  [id number]  MEDICAL RECORD NO.:  [id number]           PATIENT TYPE:  I  LOCATION:  3739                         FACILITY:  [facility]  PHYSICIAN:  Kaniel Kiang N. Sharyn Lull, M.D. DATE OF BIRTH:  1936-01-17  DATE OF ADMISSION:  03/05/2010 DATE OF DISCHARGE:  03/07/2010                              DISCHARGE SUMMARY   ADMITTING DIAGNOSES: 1. Accelerated angina. 2. Status post percutaneous coronary intervention to mid left anterior     descending. 3. Hypertension. 4. Hypercholesteremia. 5. Anemia. 6. Degenerative joint disease. 7. Gastroesophageal reflux disease.  DISCHARGE DIAGNOSES: 1. Status post unstable angina. 2. Status post percutaneous coronary intervention to left anterior     descending. 3. Minimally elevated troponin I and CPK-MB due to plaque shift to     very, very small septal perforators of no clinical significance, no     significant MI. 4. Hypertension. 5. Hypercholesteremia. 6. Anemia. 7. Degenerative joint disease. 8. Gastroesophageal reflux disease. 9. Anxiety disorder.  DISCHARGE HOME MEDICATIONS: 1. Enteric-coated aspirin 81 mg 1 tablet daily. 2. Plavix 75 mg 1 tablet daily. 3. Toprol-XL 25 mg 1/2 tablet daily. 4. Atorvastatin 20 mg 1 tablet daily. 5. Pepcid 20 mg 1 tablet twice daily. 6. Cholestyramine powder 2-4 g by mouth daily as needed as before. 7. Diovan 80 mg 1/2 tablet daily every morning as before. 8. Ditropan XL 5 mg 1 tablet daily every morning as before. 9. Hydrocodone APAP 5/325 mg 1 tablet every 6 hours as needed for     pain. 10.Lomotil 2.5 mg 1 tablet daily as needed for diarrhea. 11.Lorazepam 2 mg 1 tablet every morning as needed as before. 12.Nitrostat 0.4 mg sublingual spray 1 spray under the tongue every 5     minutes as needed.  DIET:  Low salt, low cholesterol.  ACTIVITY:  Increase activity slowly as tolerated.  Post-PTCA and stent instructions have been given.  Follow up with  Dr. Orpah Cobb in 1 week.  CONDITION AT DISCHARGE:  Stable.  The patient will be scheduled for phase II cardiac rehab as outpatient. The patient will be scheduled for stress Persantine Myoview as outpatient to evaluate the significance of RCA stenosis.  If continues to have recurrent chest pain, may need PCI to ostial and proximal RCA.  BRIEF HISTORY AND HOSPITAL COURSE:  [name] is a 75 year old black female with past medical history significant for coronary artery disease, hypertension, non-insulin-dependent diabetes mellitus controlled by diet, hypercholesteremia, and tobacco abuse who complains of recurrent retrosternal chest pain associated with exertion and stress associated with nausea, relieves with sublingual NitroMist.  States recently had chest pain while carrying the basket of cloths out of the house.  The patient had cardiac cath a few months ago which showed 70- 80% eccentric mid LAD stenosis and distal 30-40% LAD stenosis and moderate ostial and proximal RCA stenosis.  The patient was noted to be anemic, initially was evaluated by GI extensively and now is admitted for cath possible PCI.  PAST MEDICAL HISTORY:  As above.  PAST SURGICAL HISTORY:  She had hysterectomy in the past had small bowel surgery in  the past.  SOCIAL HISTORY:  She is married, retired, worked for [employer] in the past, has 2 children.  Smoked half pack per day for 40+ years.  No history of alcohol abuse.  FAMILY HISTORY:  Positive for coronary artery disease.  MEDICATION AT HOME:  She is on: 1. Aspirin 81 mg p.o. daily. 2. Plavix 75 mg p.o. daily. 3. Benicar 20 mg p.o. daily. 4. Flexeril 5 mg at bedtime which she stopped. 5. Ditropan XL 5 mg 1 tablet daily as needed. 6. Nitrolingual spray every 5 minutes x3 as needed. 7. Toprol-XL 25 mg 1/2 tablet daily. 8. Hydrocodone APAP 5/325 mg 4 times daily as needed. 9. Protonix 40 mg 1 tablet daily. 10.Cholestyramine suspension 2-4 g  mixed  with water as needed. 11.Lomotil 2.5 mg 1 tablet daily as needed for diarrhea. 12.Iron pill twice daily. 13.Lorazepam 2 mg 3 times daily.  ALLERGIES:  She has intolerance to ASPIRIN and ZOCOR.  PHYSICAL EXAMINATION:  GENERAL:  She is alert, awake, and oriented x3, in no acute distress. VITAL SIGNS:  Blood pressure was 143/75.  Pulse was 92 and regular. EYES:  Conjunctivae were pink. NECK:  Supple.  No JVD, no bruit. LUNGS:  Clear to auscultation without rhonchi or rales. CARDIOVASCULAR:  S1 and S2 were normal. ABDOMEN:  Soft and nontender. EXTREMITIES: There is no clubbing, cyanosis, or edema.  LABORATORY DATA:  Platelet inhibition P2Y12 platelet function was 152 and P2Y12 percent inhibition was 61%.  Postprocedure, her CK was 234, MB 11.2 with relative index of 4.8.  Repeat CK yesterday morning was 244, MB is trending down to 10.8 with relative index of 4.4.  Troponin I was 1.68, today troponin I is 0.72 which is trending down.  CK is 440, MB is 9.9 with relative index of 2.3 which is trending down.  Her hemoglobin is 10.9, hematocrit 32.7, white count of 6.4, and platelet count is 200,000.  BRIEF HOSPITAL COURSE:  The patient was a.m. admit and underwent left cardiac cath with selective left and right coronary angiography by Dr. Orpah Cobb and then PTCA and stenting to mid LAD as per procedure report.  The patient tolerated the procedure well.  There were no complications, but the patient then had minimally elevation of CK-MB and troponin I due to plaque shift to very, very small septal perforators of no clinical significance.  The patient did not have any significant anginal chest pain or EKG changes during the hospital stay.  Phase I cardiac rehab was called.  The patient has been ambulating in the hallway on her own without any problems.  Her groin is stable with no evidence of hematoma or bruit.  The patient will be discharged home on above medications and will be  followed up by Dr. Algie Coffer in 1 week.  The patient will be scheduled for phase II cardiac rehab as outpatient.  The patient also will be scheduled for a stress Persantine Myoview as outpatient to evaluate the significance of ostial and proximal RCA stenoses.  If she continues to have recurrent chest pain, then may need PCI to ostial and proximal RCA.     Eduardo Osier. Sharyn Lull, M.D.     MNH/MEDQ  D:  03/07/2010  T:  03/08/2010  Job:  [id number]  Electronically Signed by Rinaldo Cloud M.D. on 03/10/2010 11:10:49 PM

## 2010-03-10 NOTE — Procedures (Signed)
[name], [name]                ACCOUNT NO.:  [id number]  MEDICAL RECORD NO.:  [id number]           PATIENT TYPE:  I  LOCATION:  3739                         FACILITY:  [facility]  PHYSICIAN:  Carnesha Maravilla N. Sharyn Lull, M.D. DATE OF BIRTH:  August 20, 1935  DATE OF PROCEDURE:  03/05/2010 DATE OF DISCHARGE:  03/07/2010                           CARDIAC CATHETERIZATION   PROCEDURES: 1. Successful percutaneous transluminal coronary angioplasty to mid     left anterior descending using 2.5 x 12 mm long Mini Trek balloon. 2. Successful deployment of 2.5 x 24 mm long thrombosed element Plus     drug-eluting stent in mid left anterior descending. 3. Successful postdilatation of the stent using 2.75 x 15 mm long [place]     Trek balloon.  INDICATIONS FOR PROCEDURE:  Ms. [name] is 75 year old black female with past medical history significant for coronary artery disease, hypertension, non-insulin-dependent diabetes mellitus controlled by diet, hypercholesteremia, history of tobacco abuse.  He was admitted electively by Dr. Algie Coffer for possible PTCA stenting.  The patient complained of recurrent retrosternal chest pain associated with exertion and associated with nausea, relieved with sublingual nitro.  States recently had chest pain few days ago while carrying basket of clothes out of home.  The patient had cath in August 2011 and that showed 70-75% mid LAD stenosis and 30-40% distal stenosis and moderate proximal and ostial RCA stenosis.  The patient is admitted for elective PCI to LAD. The patient underwent left cardiac cath with selective left and right coronary angiography by Dr. Algie Coffer this morning, which showed 80-85% mid LAD stenosis and 30-40% distal LAD stenosis.  Left circumflex showed mild disease.  RCA has 50-60% ostial and proximal stenosis.  The patient is referred for PCI to LAD.  PROCEDURE IN DETAIL:  After obtaining informed consent, a 6-French left JL 3.5 guiding catheter was advanced  over the wire under fluoroscopic guidance up to the ascending aorta.  Wire was pulled out.  The catheter was aspirated and connected to the manifold.  Catheter was further advanced and engaged into left coronary ostium.  Multiple views of the left system were taken.  Findings were as above.  Interventional procedure is successful.  PTCA to mid LAD was done using 2.5 x 12 mm long Mini Trek balloon for predilatation and then 2.5 x 24 mm long thrombosed element Plus drug-eluting stent was deployed at 11 atmospheric pressure in mid LAD.  Stent was postdilated using 2.75 x 15 mm long [place] Trek balloon going up to 15 atmospheric pressure.  Lesion was dilated from 80-85% to 0% residual with excellent TIMI grade III distal flow without evidence of dissection or distal embolization.  The patient received weight-based Angiomax and 600 mg of Plavix during the procedure.  The patient tolerated procedure well.  There were no complications.  Post procedure angiogram did show plaque shift to very very small septal perforators, which were less than 0.5 mm.  [name] tolerated procedure well.  There were no complications.  The patient was transferred to recovery room in stable condition.  This completes the operative report on [name].     Marlane Mingle  Jeoffrey Massed, M.D.     MNH/MEDQ  D:  03/07/2010  T:  03/08/2010  Job:  [id number]  cc:   Ricki Rodriguez, M.D.  Electronically Signed by Rinaldo Cloud M.D. on 03/10/2010 11:10:42 PM

## 2010-04-09 ENCOUNTER — Encounter: Payer: Self-pay | Admitting: Internal Medicine

## 2010-04-10 LAB — BASIC METABOLIC PANEL
CO2: 25 mEq/L (ref 19–32)
Chloride: 105 mEq/L (ref 96–112)
Glucose, Bld: 108 mg/dL — ABNORMAL HIGH (ref 70–99)
Potassium: 3.9 mEq/L (ref 3.5–5.1)
Sodium: 139 mEq/L (ref 135–145)

## 2010-04-10 LAB — CBC
HCT: 32.9 % — ABNORMAL LOW (ref 36.0–46.0)
Hemoglobin: 10.9 g/dL — ABNORMAL LOW (ref 12.0–15.0)
MCH: 29.8 pg (ref 26.0–34.0)
MCHC: 33.1 g/dL (ref 30.0–36.0)
MCV: 90 fL (ref 78.0–100.0)

## 2010-04-12 NOTE — Telephone Encounter (Signed)
error 

## 2010-04-27 ENCOUNTER — Encounter: Payer: Self-pay | Admitting: Home Health Services

## 2010-05-11 ENCOUNTER — Ambulatory Visit (INDEPENDENT_AMBULATORY_CARE_PROVIDER_SITE_OTHER): Payer: MEDICARE | Admitting: Internal Medicine

## 2010-05-11 ENCOUNTER — Encounter: Payer: Self-pay | Admitting: Internal Medicine

## 2010-05-11 VITALS — BP 132/68 | HR 74 | Ht 62.0 in | Wt 146.0 lb

## 2010-05-11 DIAGNOSIS — E538 Deficiency of other specified B group vitamins: Secondary | ICD-10-CM

## 2010-05-11 DIAGNOSIS — K5900 Constipation, unspecified: Secondary | ICD-10-CM

## 2010-05-11 DIAGNOSIS — K589 Irritable bowel syndrome without diarrhea: Secondary | ICD-10-CM

## 2010-05-11 MED ORDER — CYANOCOBALAMIN 1000 MCG/ML IJ SOLN
1000.0000 ug | Freq: Once | INTRAMUSCULAR | Status: AC
  Administered 2010-05-11: 1000 ug via INTRAMUSCULAR

## 2010-05-11 MED ORDER — DIPHENOXYLATE-ATROPINE 2.5-0.025 MG PO TABS: 1.0000 | ORAL_TABLET | Freq: Every day | ORAL | Status: DC | PRN

## 2010-05-11 MED ORDER — DICLOFENAC SODIUM 75 MG PO TBEC: 75.0000 mg | DELAYED_RELEASE_TABLET | Freq: Every day | ORAL | Status: DC

## 2010-05-11 MED ORDER — LORAZEPAM 2 MG PO TABS: 2.0000 mg | ORAL_TABLET | Freq: Three times a day (TID) | ORAL | Status: DC

## 2010-05-11 NOTE — Progress Notes (Signed)
[name] 20-Aug-1935 MRN [id number]        History of Present Illness:  This is a and statin. She has a history of a terminal ileal resection and right hemicolectomy after and incidental perforation of the bowel doing gynecological procedure in 1982. She has suffered from depression. Loss colonoscopy in 2008 was normal. She is  IgG kappa  Monoclonal gammopathy. CT scan of the abdomen in 2009 and again it's to [place] ALT mild retroperitoneal and mesenteric adenopathy which was stable. Her weight has been stable around 146 pounds. She is here  to refill her medications: Lorazepam, Ativan and Lomotil   Past Medical History  Diagnosis Date  . Duodenitis   . Gastritis   . GERD (gastroesophageal reflux disease)   . Chronic diarrhea   . IBS (irritable bowel syndrome)   . Internal hemorrhoids   . Monoclonal gammopathy   . Urge incontinence   . Benign hypertension   . DJD (degenerative joint disease)   . Depressive disorder   . Anxiety   . Hyperlipidemia   . Vitamin B12 deficiency   . Constipation    Past Surgical History  Procedure Date  . Abdominal hysterectomy 1992    with ovarian cystectomy  . Hemicolectomy   . Ileocolonic anasomosis   . Spine surgery   . Coronary stent placement     reports that she has been smoking Cigarettes.  She has been smoking about .5 packs per day. She has never used smokeless tobacco. She reports that she does not drink alcohol or use illicit drugs. family history includes Diabetes in an unspecified family member; Kidney disease in her mother; and Stroke in her mother.  There is no history of Colon cancer. No Known Allergies      Review of Systems: Denies abdominal pain, diarrhea or rectal bleeding, complaints off all multiple joint pains especially since she's been on a statin  The remainder of the 10  point ROS is negative except as outlined in H&P     Assessment and Plan:  Problem #1 irritable bowel syndrome/diarrhea. She  no longer has diarrhea about constipation. Most likely due to medications. Is suggested stool softener probiotics and high fiber diet. History of B12 deficiency to the terminal ileal resection. We'll give B12 thousand micrograms IM today   Depression, refill Ativan 2 mg 3 times a day,   05/11/2010 [name]

## 2010-05-11 NOTE — Patient Instructions (Signed)
We have sent a prescription for diclofenac to go to your pharmacy. We have also given you a presription for both lomotil and lorazepam to take to your pharmacy.

## 2010-06-08 NOTE — Assessment & Plan Note (Signed)
[facility]                         GASTROENTEROLOGY OFFICE NOTE   Samantha, Guerrero                       MRN:          [id number]  DATE:04/30/2007                            DOB:          [birth year]    [name] is a 75 year old African-American female with irritable bowel  syndrome, status post remote right hemicolectomy with ileocolic  anastomosis in 1982 for complication of pelvic surgery.  She has chronic  depression/anxiety, headaches, gastroesophageal reflux disease.  She is  a smoker.  We have been following her for chronic diarrhea.  Last  colonoscopy in February of 2008 was essentially normal except for right  hemicolectomy and internal hemorrhoids.  She has maintained her weight  since last appointment in November 2008.  She is followed at [facility].  She has diarrhea episodes lasting about 1 day every 3 weeks.  This has been a dramatic improvement from the past when I initially  started to follow her about 20 years ago.   PHYSICAL EXAMINATION:  Blood pressure 120/62, pulse 68, weight 141  pounds.  She was alert, oriented, in no distress.  She appeared somewhat  depressed.  Lungs were clear to auscultation.  Cor with normal S1 and normal S2.  Abdomen was soft with normoactive bowel sounds.  No distension, no  tenderness.  Well-healed surgical scar.  Rectal exam with normal rectal tone.  Stool was soft, Hemoccult  negative.   IMPRESSION:  4. A 75 year old African-American female with irritable bowel      syndrome/diarrhea.  2. Depression and anxiety impacting her gastrointestinal symptoms.  I      had put her on Elavil the last time, but she did not continue it.   PLAN:  1. B12 at 1000 mcg intramuscular x1 dose.  2. Refills for lorazepam 2 mg 1 p.o. t.i.d.  3. Darvocet-N 100 dispense 40 one p.o. q.4-6 h. p.r.n. abdominal pain.  4. The patient will return in 1 year.     Hedwig Morton. Juanda Chance, MD  Electronically  Signed    DMB/MedQ  DD: 04/30/2007  DT: 04/30/2007  Job #: [id number]   cc:   [facility]

## 2010-06-08 NOTE — Assessment & Plan Note (Signed)
[facility]                         GASTROENTEROLOGY OFFICE NOTE   [name], [name]                       MRN:          [id number]  DATE:11/28/2006                            DOB:          19-Apr-1935    [name] is a 75 year old African-American female with history of  irritable bowel syndrome, post-surgical diarrhea, status post ileocolic  anastomosis in 1982 for benign disease.  She has depression and anxiety.  She has chronically elevated alkaline phosphatase and gastroesophageal  reflux disease, which has been attributed to her smoking.  She comes  today for one-year followup.  She has had two tragedies.  One is her  granddaughter's death.  Her 49 year old granddaughter, who lived with  her had a sudden death, probably cardiac, and her house burned down  about two months ago.  She has lost a little weight and is quite  depressed.  She is not eating.  Her appetite is decreased.   MEDICATIONS:  1. Lorazepam 2 mg p.o. t.i.d.  2. Hydrocodone 5/500 p.r.n. abdominal pain and DJD.  3. Aspirin 81 mg p.o. daily.  4. Ditropan XL 5 mg p.o. daily.  5. Toprol XL 50 mg p.o. daily.  6. Zocor 40 mg p.o. daily.  7. Lomotil p.r.n.   PHYSICAL EXAM:  Blood pressure 130/70, pulse 68, weight 140 pounds.  She was clearly depressed, alert and oriented.  LUNGS:  Clear to auscultation.  COR:  With normal S1, normal S2.  ABDOMEN:  Soft, nontender, with normoactive bowel sounds, no distention,  no palpable mass.  RECTAL EXAM:  Not done.   IMPRESSION:  Patient is a 75 year old African-American female with  irritable bowel syndrome, currently with weight-loss due to depression.   PLAN:  1. Refill for Lorazepam and hydrocodone.  2. B12 1000 micrograms IM.  3. New prescription for Elavil 50 mg one p.o. q.h.s.     Samantha Morton. Juanda Chance, MD  Electronically Signed    DMB/MedQ  DD: 11/28/2006  DT: 11/29/2006  Job #: [id number]   cc:   [facility]

## 2010-06-11 NOTE — Cardiovascular Report (Signed)
NAMEAVRIANNA, SMART                ACCOUNT NO.:  [id number]   MEDICAL RECORD NO.:  [id number]          PATIENT TYPE:  OIB   LOCATION:  2899                         FACILITY:  [facility]   PHYSICIAN:  Ricki Rodriguez, M.D.  DATE OF BIRTH:  1935/05/10   DATE OF PROCEDURE:  12/09/2004  DATE OF DISCHARGE:  12/09/2004                              CARDIAC CATHETERIZATION   PROCEDURE:  Left heart catheterization, selective coronary angiography, left  ventricular function study.   INDICATIONS FOR PROCEDURE:  This 75 year old black female had chest pain  along with hypertension, smoking, and a history of abnormal stress test.   APPROACH:  Right femoral artery using 4 French sheaths and catheters.   COMPLICATIONS:  None.   HEMODYNAMIC DATA:  The left ventricular pressure was 140/11, aortic pressure  146/67.   LEFT VENTRICULOGRAM:  The left ventriculogram showed hyperdynamic wall  motion with ejection fraction of 75%.   CORONARY ANATOMY:  The left main coronary artery showed ostial 10-20% concentric narrowing.   The left anterior descending coronary artery showed proximal 1/3 segment  unremarkable, mid segment eccentric 50% stenosis post diagonal two region,  and distal 20-30% lesion.  Right apex of the heart supplying distal half of  the posterior septum.  Diagonal one was a small vessel and diagonal two had  a proximal 60-70% lesion.   The left circumflex coronary artery was unremarkable.  Obtuse marginal  branch showed a proximal eccentric 30% lesion, obtuse marginal two had  ostial 20% stenosis, and the ramus branch was a very small vessel.   The right coronary artery showed proximal 20% eccentric lesion then it was  unremarkable.  The posterior branch was unremarkable and posterior  descending coronary artery had mild, diffuse disease.  The distal vessel had  100% occlusion with a very small segment of the vessel remaining.   IMPRESSION:  1.  Hypertensive heart disease.  2.   Multi-vessel coronary artery disease.   RECOMMENDATIONS:  This patient will be discharged on Lipitor 40 mg daily.  She will discontinue smoking and will have nuclear stress test for ischemia  burden and then consider angioplasty if needed.  Otherwise, medical therapy.      Ricki Rodriguez, M.D.  Electronically Signed     ASK/MEDQ  D:  02/24/2005  T:  02/24/2005  Job:  [id number]

## 2010-07-30 ENCOUNTER — Emergency Department (HOSPITAL_COMMUNITY): Payer: Medicare Other

## 2010-07-30 ENCOUNTER — Inpatient Hospital Stay (HOSPITAL_COMMUNITY)
Admission: EM | Admit: 2010-07-30 | Discharge: 2010-08-02 | DRG: 313 | Disposition: A | Discharge status: Home / Self Care | Payer: Medicare Other | Attending: Cardiovascular Disease | Admitting: Cardiovascular Disease

## 2010-07-30 DIAGNOSIS — Z9861 Coronary angioplasty status: Secondary | ICD-10-CM

## 2010-07-30 DIAGNOSIS — K219 Gastro-esophageal reflux disease without esophagitis: Secondary | ICD-10-CM | POA: Diagnosis present

## 2010-07-30 DIAGNOSIS — Z79899 Other long term (current) drug therapy: Secondary | ICD-10-CM

## 2010-07-30 DIAGNOSIS — I2510 Atherosclerotic heart disease of native coronary artery without angina pectoris: Secondary | ICD-10-CM | POA: Diagnosis present

## 2010-07-30 DIAGNOSIS — F411 Generalized anxiety disorder: Secondary | ICD-10-CM | POA: Diagnosis present

## 2010-07-30 DIAGNOSIS — R0789 Other chest pain: Principal | ICD-10-CM | POA: Diagnosis present

## 2010-07-30 DIAGNOSIS — D649 Anemia, unspecified: Secondary | ICD-10-CM | POA: Diagnosis present

## 2010-07-30 DIAGNOSIS — I10 Essential (primary) hypertension: Secondary | ICD-10-CM | POA: Diagnosis present

## 2010-07-30 DIAGNOSIS — Z8249 Family history of ischemic heart disease and other diseases of the circulatory system: Secondary | ICD-10-CM

## 2010-07-30 DIAGNOSIS — E119 Type 2 diabetes mellitus without complications: Secondary | ICD-10-CM | POA: Diagnosis present

## 2010-07-30 DIAGNOSIS — F17200 Nicotine dependence, unspecified, uncomplicated: Secondary | ICD-10-CM | POA: Diagnosis present

## 2010-07-30 DIAGNOSIS — Z7982 Long term (current) use of aspirin: Secondary | ICD-10-CM

## 2010-07-30 LAB — CBC
HCT: 25.9 % — ABNORMAL LOW (ref 36.0–46.0)
Hemoglobin: 8.9 g/dL — ABNORMAL LOW (ref 12.0–15.0)
MCH: 31.6 pg (ref 26.0–34.0)
MCHC: 34.4 g/dL (ref 30.0–36.0)

## 2010-07-30 LAB — DIFFERENTIAL
Basophils Absolute: 0 10*3/uL (ref 0.0–0.1)
Lymphocytes Relative: 24 % (ref 12–46)
Monocytes Absolute: 0.4 10*3/uL (ref 0.1–1.0)
Monocytes Relative: 7 % (ref 3–12)
Neutro Abs: 3.3 10*3/uL (ref 1.7–7.7)

## 2010-07-30 LAB — COMPREHENSIVE METABOLIC PANEL
ALT: 7 U/L (ref 0–35)
Albumin: 3.4 g/dL — ABNORMAL LOW (ref 3.5–5.2)
Alkaline Phosphatase: 83 U/L (ref 39–117)
BUN: 26 mg/dL — ABNORMAL HIGH (ref 6–23)
Chloride: 108 mEq/L (ref 96–112)
Potassium: 4.3 mEq/L (ref 3.5–5.1)
Sodium: 138 mEq/L (ref 135–145)
Total Bilirubin: 0.3 mg/dL (ref 0.3–1.2)

## 2010-07-30 LAB — PROTIME-INR: INR: 1.08 (ref 0.00–1.49)

## 2010-07-30 LAB — CK TOTAL AND CKMB (NOT AT ARMC): Total CK: 154 U/L (ref 7–177)

## 2010-07-31 LAB — CBC
HCT: 24.9 % — ABNORMAL LOW (ref 36.0–46.0)
Hemoglobin: 8.4 g/dL — ABNORMAL LOW (ref 12.0–15.0)
MCV: 92.6 fL (ref 78.0–100.0)
RDW: 13.9 % (ref 11.5–15.5)
WBC: 5.5 10*3/uL (ref 4.0–10.5)

## 2010-07-31 LAB — CARDIAC PANEL(CRET KIN+CKTOT+MB+TROPI)
CK, MB: 3.2 ng/mL (ref 0.3–4.0)
CK, MB: 3.7 ng/mL (ref 0.3–4.0)
Relative Index: 2.4 (ref 0.0–2.5)
Relative Index: 2.4 (ref 0.0–2.5)
Total CK: 133 U/L (ref 7–177)
Troponin I: <0.3 ng/mL (ref <0.30)
Troponin I: <0.3 ng/mL (ref <0.30)

## 2010-07-31 LAB — LIPID PANEL
Cholesterol: 100 mg/dL (ref 0–200)
Triglycerides: 52 mg/dL (ref <150)
VLDL: 10 mg/dL (ref 0–40)

## 2010-08-01 ENCOUNTER — Inpatient Hospital Stay (HOSPITAL_COMMUNITY): Payer: Medicare Other

## 2010-08-01 LAB — CBC
Hemoglobin: 8.8 g/dL — ABNORMAL LOW (ref 12.0–15.0)
Platelets: 201 10*3/uL (ref 150–400)
RBC: 2.82 MIL/uL — ABNORMAL LOW (ref 3.87–5.11)
WBC: 5.8 10*3/uL (ref 4.0–10.5)

## 2010-08-01 MED ORDER — TECHNETIUM TC 99M TETROFOSMIN IV KIT
10.0000 | PACK | Freq: Once | INTRAVENOUS | Status: AC | PRN
  Administered 2010-08-01: 10 via INTRAVENOUS

## 2010-08-01 MED ORDER — TECHNETIUM TC 99M TETROFOSMIN IV KIT
30.0000 | PACK | Freq: Once | INTRAVENOUS | Status: AC | PRN
  Administered 2010-08-01: 30 via INTRAVENOUS

## 2010-08-02 ENCOUNTER — Other Ambulatory Visit (HOSPITAL_COMMUNITY): Payer: Medicare Other

## 2010-08-02 LAB — CBC
Hemoglobin: 9.1 g/dL — ABNORMAL LOW (ref 12.0–15.0)
MCH: 31.4 pg (ref 26.0–34.0)
MCV: 92.4 fL (ref 78.0–100.0)
Platelets: 194 10*3/uL (ref 150–400)
RBC: 2.9 MIL/uL — ABNORMAL LOW (ref 3.87–5.11)
WBC: 6.2 10*3/uL (ref 4.0–10.5)

## 2010-08-02 LAB — DIFFERENTIAL
Eosinophils Absolute: 0.4 10*3/uL (ref 0.0–0.7)
Lymphocytes Relative: 33 % (ref 12–46)
Lymphs Abs: 2 10*3/uL (ref 0.7–4.0)
Monocytes Relative: 12 % (ref 3–12)
Neutro Abs: 3 10*3/uL (ref 1.7–7.7)
Neutrophils Relative %: 49 % (ref 43–77)

## 2010-08-06 ENCOUNTER — Other Ambulatory Visit: Payer: Self-pay | Admitting: Internal Medicine

## 2010-08-06 NOTE — Telephone Encounter (Signed)
From: Hart Carwin, MD    Sent: 08/06/2010   1:19 PM      To: Vernia Buff, CMA Subject: Diclofenac                                     I have prescribed Diclofenac a while ago for low back pain. Please keep refilling, it helps to reduce her pain meds. ----- Message -----    From: Vernia Buff, CMA    Sent: 08/06/2010   8:20 AM      To: Hart Carwin, MD  Db- Patient requests refills on Diclofenac. We gave her a few refills at her office visit in April 17th. However, not sure why she is getting it. Do you want me to give her refills??

## 2010-08-17 NOTE — Discharge Summary (Signed)
[name], [name]                ACCOUNT NO.:  [id number]  MEDICAL RECORD NO.:  [id number]  LOCATION:  3729                         FACILITY:  [facility]  PHYSICIAN:  Ricki Rodriguez, M.D.  DATE OF BIRTH:  12-Jan-1936  DATE OF ADMISSION:  07/30/2010 DATE OF DISCHARGE:  08/02/2010                              DISCHARGE SUMMARY   FINAL DIAGNOSES: 1. Chest pain. 2. Multivessel, native vessel coronary artery disease. 3. Status post left anterior descending coronary artery stent. 4. Hypertension. 5. Chronic anemia. 6. Gastroesophageal reflux disease. 7. Tobacco use disorder. 8. Anxiety.  DISCHARGE MEDICATIONS: 1. Nitroglycerin 0.4 mg tablet 1 sublingual every 5 minutes x3 as     needed for chest pain. 2. Toprol-XL 25 mg 1 everyday. 3. Aspirin 81 mg 1 in the morning. 4. Atorvastatin 20 mg 1 daily. 5. Benicar 20 mg half a tablet daily. 6. Cholestyramine powder 2-4 g daily as needed for diarrhea. 7. Plavix 75 mg daily. 8. Ditropan 5 mg XL 1 tablet in the morning. 9. Hydrocodone/APAP 5/325 mg 1 twice daily as needed. 10.Lomotil 2.5 mg 1 daily as needed. 11.Lorazepam 2 mg 1 daily as needed. 12.Nitroglycerin sublingual spray, 1 spray under the tongue every 5     minutes as needed up to 3 doses as needed. 13.Pepcid 20 mg 1 twice daily. 14.Potassium chloride 10 mEq 2 tablets daily.  DISCHARGE DIET:  Low-sodium, heart-healthy diet.  DISCHARGE ACTIVITY:  The patient is to increase activity gradually as tolerated.  CONDITION ON DISCHARGE:  Stable.  FOLLOWUP:  By Ricki Rodriguez, MD in 2 weeks.  HISTORY:  This 75 year old black female had chest pain 4 hours ago radiating to her left arm.  The patient had angioplasty done approximately 5 months ago.  PHYSICAL EXAMINATION:  VITAL SIGNS:  Temperature 98, pulse 60, respirations 16, blood pressure 123/55. GENERAL:  The patient is a well-built, averagely nourished female in no acute distress. HEENT: The patient is normocephalic,  atraumatic with brown eyes. Conjunctivae pink.  Sclerae nonicteric.  Bilateral lens haziness. NECK:  No JVD.  No carotid bruits. LUNGS:  Clear bilaterally. HEART:  Normal S1 and S2. ABDOMEN:  Soft and nontender. EXTREMITIES:  No edema, cyanosis, or clubbing. SKIN:  Warm and dry. NEUROLOGIC:  The patient moves all four extremities, has bilateral equal grips, has decreased hearing, and cranial nerves are grossly intact.  LABORATORY DATA:  Low hemoglobin of 8.9, hematocrit of 25.9, normal WBC count, platelet count normal.  INR normal.  Electrolytes:  BUN and creatinine normal.  CK-MB and troponin I negative x3.  Chest x-ray:  No acute disease.  EKG:  Normal sinus rhythm.  Nuclear stress test negative for reversible ischemia with an ejection fraction of 65%.  HOSPITAL COURSE:  The patient was admitted to telemetry unit. Myocardial infarction was ruled out.  She underwent a nuclear stress test that failed to show any reversible ischemia.  Her medications were adjusted, and she was discharged home in satisfactory condition with a followup by me in 2 weeks.     Ricki Rodriguez, M.D.     ASK/MEDQ  D:  08/02/2010  T:  08/02/2010  Job:  [id number]  Electronically Signed by Viviano Simas  [facility] M.D. on 08/17/2010 10:14:24 AM

## 2010-08-30 ENCOUNTER — Telehealth: Payer: Self-pay | Admitting: Internal Medicine

## 2010-08-30 NOTE — Telephone Encounter (Signed)
Patient calling to report that since she was in the hospital back in July(r/o MI), she has had problems with diarrhea. States she has watery diarrhea 2-3/day. States she has cramping with bowel movement but then it stops. Denies any blood in stool or antibiotic use. She has Cholestyramine but it does not help her much. Also, her hemorrhoids are painful due to the diarrhea. She would like Lomotil for the diarrhea. Please, advise.

## 2010-08-30 NOTE — Telephone Encounter (Signed)
Likely bacterial overgrowth diarrhea. Please start Flagyl 250 mg po tid, #30, Lomotil #100, 1 po tid prn diarrha, no refill.

## 2010-08-31 MED ORDER — DIPHENOXYLATE-ATROPINE 2.5-0.025 MG PO TABS: ORAL_TABLET | ORAL | Status: DC

## 2010-08-31 MED ORDER — METRONIDAZOLE 250 MG PO TABS: ORAL_TABLET | ORAL | Status: DC

## 2010-08-31 NOTE — Telephone Encounter (Signed)
Spoke with patient's husband and gave him Dr. Regino Schultze recommendations. He will have the patient pick up her rx's

## 2010-11-02 ENCOUNTER — Other Ambulatory Visit: Payer: Self-pay | Admitting: Internal Medicine

## 2010-12-13 ENCOUNTER — Other Ambulatory Visit: Payer: Self-pay | Admitting: Internal Medicine

## 2010-12-21 ENCOUNTER — Encounter: Payer: Self-pay | Admitting: Home Health Services

## 2011-01-31 ENCOUNTER — Other Ambulatory Visit: Payer: Self-pay | Admitting: Internal Medicine

## 2011-04-28 ENCOUNTER — Other Ambulatory Visit: Payer: Self-pay | Admitting: Internal Medicine

## 2011-04-29 ENCOUNTER — Other Ambulatory Visit: Payer: Self-pay | Admitting: Internal Medicine

## 2011-04-29 MED ORDER — LORAZEPAM 2 MG PO TABS: 2.0000 mg | ORAL_TABLET | Freq: Three times a day (TID) | ORAL | Status: DC

## 2011-04-29 NOTE — Telephone Encounter (Signed)
rx sent. Patient must keep her appointment for further refills.

## 2011-05-18 ENCOUNTER — Encounter: Payer: Self-pay | Admitting: *Deleted

## 2011-05-30 ENCOUNTER — Encounter: Payer: Self-pay | Admitting: Internal Medicine

## 2011-05-30 ENCOUNTER — Ambulatory Visit (INDEPENDENT_AMBULATORY_CARE_PROVIDER_SITE_OTHER): Payer: Medicare Other | Admitting: Internal Medicine

## 2011-05-30 ENCOUNTER — Other Ambulatory Visit (INDEPENDENT_AMBULATORY_CARE_PROVIDER_SITE_OTHER): Payer: Medicare Other

## 2011-05-30 VITALS — BP 90/52 | HR 80 | Ht 62.0 in | Wt 133.5 lb

## 2011-05-30 DIAGNOSIS — E538 Deficiency of other specified B group vitamins: Secondary | ICD-10-CM

## 2011-05-30 DIAGNOSIS — T8189XA Other complications of procedures, not elsewhere classified, initial encounter: Secondary | ICD-10-CM

## 2011-05-30 LAB — VITAMIN B12: Vitamin B-12: 332 pg/mL (ref 211–911)

## 2011-05-30 LAB — IBC PANEL: Saturation Ratios: 13.6 % — ABNORMAL LOW (ref 20.0–50.0)

## 2011-05-30 LAB — FOLATE: Folate: 21.9 ng/mL (ref >5.9)

## 2011-05-30 MED ORDER — DIPHENOXYLATE-ATROPINE 2.5-0.025 MG PO TABS: ORAL_TABLET | ORAL | Status: DC

## 2011-05-30 MED ORDER — CHOLESTYRAMINE 4 GM/DOSE PO POWD: 4.0000 g | Freq: Three times a day (TID) | ORAL | Status: DC

## 2011-05-30 MED ORDER — MEGESTROL ACETATE 40 MG/ML PO SUSP: 200.0000 mg | Freq: Every day | ORAL | Status: DC

## 2011-05-30 MED ORDER — LORAZEPAM 2 MG PO TABS: 2.0000 mg | ORAL_TABLET | Freq: Three times a day (TID) | ORAL | Status: DC

## 2011-05-30 NOTE — Progress Notes (Signed)
LAKENYA RIENDEAU [birth year] MRN [id number]   History of Present Illness:  This is a 76 year old [place] American female who comes for refills of her medications. Her last appointment was in April 2012. She has a history of a terminal ileum resection and right hemicolectomy after incidental perforation of the bowel during gynecological surgery in 1982. I have been following her for over 20 years. Her initial diarrhea has improved considerably on Lomotil, Questran and Ativan. She has monoclonal gammopathy. Her last colonoscopy in 2008 was normal; she just had comprehensive blood tests in Dr. Roseanne Kaufman office and was told that they were normal. She takes iron supplements. She also takes Voltaren for DJD and Plavix for coronary artery disease. She is status post stent placement last summer.   Past Medical History  Diagnosis Date  . Duodenitis   . Gastritis   . GERD (gastroesophageal reflux disease)   . Chronic diarrhea   . IBS (irritable bowel syndrome)   . Internal hemorrhoids   . Monoclonal gammopathy   . Urge incontinence   . Benign hypertension   . DJD (degenerative joint disease)   . Depressive disorder   . Anxiety   . Hyperlipidemia   . Vitamin B12 deficiency   . Constipation   . CAD (coronary artery disease)    Past Surgical History  Procedure Date  . Abdominal hysterectomy 1992    with ovarian cystectomy  . Hemicolectomy   . Ileocolonic anasomosis   . Spine surgery   . Coronary stent placement     reports that she has been smoking Cigarettes.  She has been smoking about .5 packs per day. She has never used smokeless tobacco. She reports that she does not drink alcohol or use illicit drugs. family history includes Diabetes in an unspecified family member; Kidney disease in her mother; and Stroke in her mother.  There is no history of Colon cancer. No Known Allergies      Review of Systems: Denies heartburn dysphagia or shortness of breath positive for lack of energy  The  remainder of the 10 point ROS is negative except as outlined in H&P   Physical Exam: General appearance  Well developed, in no distress. Eyes- non icteric. HEENT nontraumatic, normocephalic. Mouth no lesions, tongue papillated, no cheilosis. Neck supple without adenopathy, thyroid not enlarged, no carotid bruits, no JVD. Lungs Clear to auscultation bilaterally. Cor normal S1, normal S2, regular rhythm, no murmur,  quiet precordium. Abdomen: Soft with well-healed surgical scars. Nontender. Normoactive bowel sounds. No distention. Rectal: Soft trace Hemoccult-positive stool, painful digital exam. Mild anal spasm. Extremities no pedal edema. Skin no lesions. Neurological alert and oriented x 3. Psychological normal mood and affect.  Assessment and Plan:  Problem #1 We will go ahead and refill the patient's medications. Her heme positive stool is likely from anal irritation. We will obtain blood test results from Dr. Algie Coffer. She is up-to-date on her colonoscopy. We will see her in one year. Problem #2 anemia -review of labs from Dr Algie Coffer reveal Hgb of 10.0. We will obtain stool hemo occults and check iron levels and B12. She may need further evaluation of her anemia with colonoscopy/EGD   05/30/2011 [name]

## 2011-05-30 NOTE — Patient Instructions (Addendum)
You have been given a separate informational sheet regarding your tobacco use, the importance of quitting and local resources to help you quit. We have given you prescriptions for: Lorazepam, Lomotil and Questran. Please follow up with Dr Juanda Chance in 1 year. Your physician has requested that you go to the basement for the following lab work before leaving today: B12 level, IBC, Folate Please complete hemoccult cards. CC: Dr Aviva Signs

## 2011-06-01 ENCOUNTER — Telehealth: Payer: Self-pay | Admitting: Internal Medicine

## 2011-06-01 NOTE — Telephone Encounter (Signed)
Already spoke with patient

## 2011-08-26 ENCOUNTER — Other Ambulatory Visit: Payer: Self-pay | Admitting: Internal Medicine

## 2011-12-07 ENCOUNTER — Telehealth: Payer: Self-pay | Admitting: Internal Medicine

## 2011-12-07 MED ORDER — LORAZEPAM 2 MG PO TABS: 2.0000 mg | ORAL_TABLET | Freq: Three times a day (TID) | ORAL | Status: DC

## 2011-12-07 NOTE — Telephone Encounter (Signed)
rx sent to pharmacy.  Patient advised. 

## 2012-02-28 ENCOUNTER — Other Ambulatory Visit: Payer: Self-pay

## 2012-04-03 ENCOUNTER — Other Ambulatory Visit: Payer: Self-pay | Admitting: Internal Medicine

## 2012-06-21 ENCOUNTER — Other Ambulatory Visit: Payer: Self-pay | Admitting: Internal Medicine

## 2012-06-22 ENCOUNTER — Other Ambulatory Visit: Payer: Self-pay | Admitting: Gastroenterology

## 2012-06-25 ENCOUNTER — Emergency Department (HOSPITAL_COMMUNITY): Payer: Medicare Other

## 2012-06-25 ENCOUNTER — Encounter (HOSPITAL_COMMUNITY): Payer: Self-pay | Admitting: Emergency Medicine

## 2012-06-25 ENCOUNTER — Emergency Department (HOSPITAL_COMMUNITY)
Admission: EM | Admit: 2012-06-25 | Discharge: 2012-06-25 | Disposition: A | Discharge status: Home / Self Care | Payer: Medicare Other | Attending: Emergency Medicine | Admitting: Emergency Medicine

## 2012-06-25 DIAGNOSIS — F329 Major depressive disorder, single episode, unspecified: Secondary | ICD-10-CM | POA: Insufficient documentation

## 2012-06-25 DIAGNOSIS — Z79899 Other long term (current) drug therapy: Secondary | ICD-10-CM | POA: Insufficient documentation

## 2012-06-25 DIAGNOSIS — Z87448 Personal history of other diseases of urinary system: Secondary | ICD-10-CM | POA: Insufficient documentation

## 2012-06-25 DIAGNOSIS — Z8639 Personal history of other endocrine, nutritional and metabolic disease: Secondary | ICD-10-CM | POA: Insufficient documentation

## 2012-06-25 DIAGNOSIS — F411 Generalized anxiety disorder: Secondary | ICD-10-CM | POA: Insufficient documentation

## 2012-06-25 DIAGNOSIS — Z8679 Personal history of other diseases of the circulatory system: Secondary | ICD-10-CM | POA: Insufficient documentation

## 2012-06-25 DIAGNOSIS — I2510 Atherosclerotic heart disease of native coronary artery without angina pectoris: Secondary | ICD-10-CM | POA: Insufficient documentation

## 2012-06-25 DIAGNOSIS — B349 Viral infection, unspecified: Secondary | ICD-10-CM

## 2012-06-25 DIAGNOSIS — J069 Acute upper respiratory infection, unspecified: Secondary | ICD-10-CM | POA: Insufficient documentation

## 2012-06-25 DIAGNOSIS — Z8739 Personal history of other diseases of the musculoskeletal system and connective tissue: Secondary | ICD-10-CM | POA: Insufficient documentation

## 2012-06-25 DIAGNOSIS — Z8719 Personal history of other diseases of the digestive system: Secondary | ICD-10-CM | POA: Insufficient documentation

## 2012-06-25 DIAGNOSIS — B9789 Other viral agents as the cause of diseases classified elsewhere: Secondary | ICD-10-CM | POA: Insufficient documentation

## 2012-06-25 DIAGNOSIS — F3289 Other specified depressive episodes: Secondary | ICD-10-CM | POA: Insufficient documentation

## 2012-06-25 DIAGNOSIS — H9209 Otalgia, unspecified ear: Secondary | ICD-10-CM | POA: Insufficient documentation

## 2012-06-25 DIAGNOSIS — I10 Essential (primary) hypertension: Secondary | ICD-10-CM | POA: Insufficient documentation

## 2012-06-25 DIAGNOSIS — F17200 Nicotine dependence, unspecified, uncomplicated: Secondary | ICD-10-CM | POA: Insufficient documentation

## 2012-06-25 DIAGNOSIS — Z862 Personal history of diseases of the blood and blood-forming organs and certain disorders involving the immune mechanism: Secondary | ICD-10-CM | POA: Insufficient documentation

## 2012-06-25 DIAGNOSIS — R0609 Other forms of dyspnea: Secondary | ICD-10-CM | POA: Insufficient documentation

## 2012-06-25 DIAGNOSIS — Z7982 Long term (current) use of aspirin: Secondary | ICD-10-CM | POA: Insufficient documentation

## 2012-06-25 DIAGNOSIS — D649 Anemia, unspecified: Secondary | ICD-10-CM | POA: Insufficient documentation

## 2012-06-25 DIAGNOSIS — J329 Chronic sinusitis, unspecified: Secondary | ICD-10-CM | POA: Insufficient documentation

## 2012-06-25 DIAGNOSIS — E785 Hyperlipidemia, unspecified: Secondary | ICD-10-CM | POA: Insufficient documentation

## 2012-06-25 DIAGNOSIS — R519 Headache, unspecified: Secondary | ICD-10-CM

## 2012-06-25 DIAGNOSIS — R0989 Other specified symptoms and signs involving the circulatory and respiratory systems: Secondary | ICD-10-CM | POA: Insufficient documentation

## 2012-06-25 LAB — COMPREHENSIVE METABOLIC PANEL
ALT: 11 U/L (ref 0–35)
AST: 13 U/L (ref 0–37)
Albumin: 3.7 g/dL (ref 3.5–5.2)
Alkaline Phosphatase: 83 U/L (ref 39–117)
BUN: 13 mg/dL (ref 6–23)
Chloride: 106 mEq/L (ref 96–112)
Potassium: 4.4 mEq/L (ref 3.5–5.1)
Sodium: 140 mEq/L (ref 135–145)
Total Bilirubin: 0.4 mg/dL (ref 0.3–1.2)

## 2012-06-25 LAB — CBC WITH DIFFERENTIAL/PLATELET
Basophils Relative: 1 % (ref 0–1)
HCT: 37.5 % (ref 36.0–46.0)
MCH: 32.2 pg (ref 26.0–34.0)
MCV: 94.9 fL (ref 78.0–100.0)
Monocytes Relative: 8 % (ref 3–12)
Neutro Abs: 3 10*3/uL (ref 1.7–7.7)
Neutrophils Relative %: 51 % (ref 43–77)
RDW: 13.7 % (ref 11.5–15.5)
WBC: 5.9 10*3/uL (ref 4.0–10.5)

## 2012-06-25 MED ORDER — SULFAMETHOXAZOLE-TRIMETHOPRIM 800-160 MG PO TABS: 1.0000 | ORAL_TABLET | Freq: Two times a day (BID) | ORAL | Status: DC

## 2012-06-25 NOTE — ED Notes (Signed)
Triage by Me not Shanda Bumps

## 2012-06-25 NOTE — ED Notes (Signed)
I gave the patient a warm blanket. 

## 2012-06-25 NOTE — ED Provider Notes (Signed)
Pleasant elderly female relates she's been having headaches recently without past medical history of headaches. She indicates she has headaches in her left temple and behind her left eye and into her left ear. She reports a lot of nasal congestion and coughing up mucus in the morning. She denies any fever, nausea, or vomiting.  Patient has tenderness over her leftmaxillary sinus and when I palpate her left medial nose consistent with his wife sinus. She is less tender over the frontal sinus on the left. She is nontender in the same areas on the right.  Medical screening examination/treatment/procedure(s) were conducted as a shared visit with non-physician practitioner(s) and myself.  I personally evaluated the patient during the encounter  Devoria Albe, MD, Franz Dell, MD 06/25/12 [phone]

## 2012-06-25 NOTE — ED Provider Notes (Signed)
History    This chart was scribed for Raymon Mutton, PA working with Candise Bowens, MD by ED Scribe, Burman Nieves. This patient was seen in room TR04C/TR04C and the patient's care was started at 3:08 PM.   CSN: [id number]  Arrival date & time 06/25/12  1353   First MD Initiated Contact with Patient 06/25/12 1508      Chief Complaint  Patient presents with  . Headache    (Consider location/radiation/quality/duration/timing/severity/associated sxs/prior treatment) Patient is a 77 y.o. female presenting with headaches. The history is provided by the patient and a relative. No language interpreter was used.  Headache Associated symptoms: congestion and ear pain    HPI Comments: [name] is a 77 y.o. female with h/o cateracts who presents to the Emergency Department complaining of a moderate constant headache with a throbbing sensation that started earlier today. Pt states that she has been having intermittent headaches for the past 3 days, but today it has become constant. She states the pain is exacerbated when going outside due to sunlight. Pt states she is experiencing pain focused on the left side of her head in the temple region going in to her ear. She states she had surgery back in February 2014 for a cyst removal and has been experiencing mild left ear pain still from procedure. She states that she has been experiencing some watery eye discharge with associated congestion. She states the congestion has been going on for some time now. She states the congestion is worse in the morning. Pt took a Vicodin earlier today with no immediate relief.  Pt denies chest pain, difficulty breathing/swallowing, fever, chills, cough, nausea, vomiting, diarrhea, SOB,  and any other associated symptoms. Pt currently takes Plavix.  Past Medical History  Diagnosis Date  . Duodenitis   . Gastritis   . GERD (gastroesophageal reflux disease)   . Chronic diarrhea   . IBS (irritable bowel syndrome)   .  Internal hemorrhoids   . Monoclonal gammopathy   . Urge incontinence   . Benign hypertension   . DJD (degenerative joint disease)   . Depressive disorder   . Anxiety   . Hyperlipidemia   . Vitamin B12 deficiency   . Constipation   . CAD (coronary artery disease)   . Anemia     Past Surgical History  Procedure Laterality Date  . Abdominal hysterectomy  1992    with ovarian cystectomy  . Hemicolectomy    . Ileocolonic anasomosis    . Spine surgery    . Coronary stent placement      Family History  Problem Relation Age of Onset  . Kidney disease Mother   . Stroke Mother   . Diabetes      sibling  . Colon cancer Neg Hx     History  Substance Use Topics  . Smoking status: Current Every Day Smoker -- 0.50 packs/day    Types: Cigarettes  . Smokeless tobacco: Never Used  . Alcohol Use: No    OB History   Grav Para Term Preterm Abortions TAB SAB Ect Mult Living                  Review of Systems  HENT: Positive for ear pain and congestion.   Neurological: Positive for headaches.  All other systems reviewed and are negative.    Allergies  Review of patient's allergies indicates no known allergies.  Home Medications   Current Outpatient Rx  Name  Route  Sig  Dispense  Refill  . aspirin 81 MG tablet   Oral   Take 81 mg by mouth daily.           [name] atorvastatin (LIPITOR) 20 MG tablet   Oral   Take 20 mg by mouth daily.         . clopidogrel (PLAVIX) 75 MG tablet   Oral   Take 75 mg by mouth daily.         . famotidine (PEPCID) 20 MG tablet   Oral   Take 20 mg by mouth 2 (two) times daily.         . ferrous sulfate 325 (65 FE) MG tablet   Oral   Take 325 mg by mouth 2 (two) times daily.         [name] HYDROcodone-acetaminophen (NORCO) 10-325 MG per tablet   Oral   Take by mouth. Take 1-2 tablets by mouth every 4-6 hours as needed         . LORazepam (ATIVAN) 2 MG tablet      TAKE 1 TABLET BY MOUTH THREE TIMES DAILY   90 tablet   0   .  megestrol (MEGACE) 40 MG/ML suspension      TAKE 1 TEASPOONFUL BY MOUTH DAILY   240 mL   1   . nitroGLYCERIN (NITROSTAT) 0.4 MG SL tablet   Sublingual   Place 0.4 mg under the tongue every 5 (five) minutes as needed. Dr. Algie Coffer          . oxybutynin (DITROPAN-XL) 5 MG 24 hr tablet   Oral   Take 5 mg by mouth daily as needed.           . potassium chloride (KLOR-CON) 10 MEQ CR tablet   Oral   Take 20 mEq by mouth daily.            BP 113/57  Pulse 95  Temp(Src) 98.8 F (37.1 C) (Oral)  Resp 16  SpO2 100%  Physical Exam  Nursing note and vitals reviewed. Constitutional: She is oriented to person, place, and time. She appears well-developed and well-nourished. No distress.  HENT:  Head: Normocephalic and atraumatic.  Mouth/Throat: Oropharynx is clear and moist. No oropharyngeal exudate.  uvula midline symmetrical elevation.  Maxillary sinus tenderness bilaterally  Eyes: Conjunctivae and EOM are normal. Pupils are equal, round, and reactive to light. Right eye exhibits no discharge. Left eye exhibits no discharge.  Neck: Normal range of motion. Neck supple. No tracheal deviation present.  Cardiovascular: Normal rate, regular rhythm and normal heart sounds.  Exam reveals no friction rub.   No murmur heard. Pulses:      Radial pulses are 2+ on the right side, and 2+ on the left side.  Pulmonary/Chest: Effort normal. No respiratory distress. She has decreased breath sounds in the right middle field and the left lower field. She has rales in the right middle field and the right lower field.  Musculoskeletal: Normal range of motion.  Neurological: She is alert and oriented to person, place, and time. No cranial nerve deficit. She exhibits normal muscle tone. Coordination normal.  cranial nerves 3-12 grossly intact.   Skin: Skin is warm and dry.  Psychiatric: She has a normal mood and affect. Her behavior is normal.    ED Course  Procedures (including critical care  time) DIAGNOSTIC STUDIES: Oxygen Saturation is 100% on room air, normal by my interpretation.    COORDINATION OF CARE: 4:38 PM Discussed ED treatment with pt and pt agrees.  Labs Reviewed  COMPREHENSIVE METABOLIC PANEL - Abnormal; Notable for the following:    Glucose, Bld 112 (*)    GFR calc non Af Amer 83 (*)    All other components within normal limits  CBC WITH DIFFERENTIAL  PROTIME-INR  APTT   Dg Chest 2 View  06/25/2012   *RADIOLOGY REPORT*  Clinical Data: Headache.  Hypertension.  CHEST - 2 VIEW  Comparison: 07/30/2010.  Findings: The cardiac silhouette, mediastinal and hilar contours are within normal limits and stable.  Coronary artery stents are noted.  There is tortuosity and calcification of the thoracic aorta.  The lungs are clear.  No pleural effusion.  The bony thorax is intact.  Stable scoliosis.  IMPRESSION: No acute cardiopulmonary findings.   Original Report Authenticated By: Rudie Meyer, M.D.     1. URI (upper respiratory infection)   2. Viral illness   3. Headache       MDM  I personally performed the services described in this documentation, which was scribed in my presence. The recorded information has been reviewed and is accurate.  Patient reported that headache has improved. Patient on Plavix - INR and aPTT within normal limits. Lab negative findings and imaging negative findings for acute infection or pulmonary infection. Negative head injury - patient less likely to have intracranial injury. Suspicion is viral URI - nasal congestion, cough, headache. Discussed case with Dr. Lars Mage - saw patient and stated to treat patient as a sinus infection, acute. Patient stable, afebrile. Discharged patient. Discussed with patient to stay hydrated and to rest. Referred patient to follow-up with PCP by the end of this week. Discussed with patient to take Tylenol as when needed for headache relief. Discussed with patient that since being on antibiotic therapy she  will need to get INR and aPTT levels re-checked within one week with PCP. Discussed with patient to monitor symptoms and if symptoms are to worsen or change to report back to the ED. Patient agreed to plan of care, understood, all questions answered.   Raymon Mutton, PA-C 06/26/12 0220  Raymon Mutton, PA-C 06/26/12 [phone]

## 2012-06-25 NOTE — ED Notes (Signed)
Headache for last two days and nasal congestion.  VSS.  Per patient headache to left eye and left temple area and eye waters.  Pt reports left ear hurts.  No vision change.

## 2012-06-25 NOTE — ED Notes (Signed)
Pt c/o headache behind left ear into left temple. Onset 3 days ago. Has been on and off. Denies N/V.

## 2012-06-26 NOTE — ED Provider Notes (Signed)
See prior note   Ward Givens, MD 06/26/12 1238

## 2012-07-20 ENCOUNTER — Telehealth: Payer: Self-pay | Admitting: Internal Medicine

## 2012-07-20 NOTE — Telephone Encounter (Signed)
I attempted to reach patient at home phone but no answer and no voicemail. Also attempted to contact patient at mobile number but got busy signal. Patient needs an office visit before we will be able to provide lorazepam refills for her. I will attempt to call patient at a later time.

## 2012-07-23 MED ORDER — LORAZEPAM 2 MG PO TABS: ORAL_TABLET | ORAL | Status: DC

## 2012-07-23 NOTE — Telephone Encounter (Signed)
Patient has scheduled an office visit for 08/28/12 with Dr Juanda Chance. Rx for Lorazepam has been sent to the pharmacy to last until her office visit.

## 2012-08-08 ENCOUNTER — Emergency Department (HOSPITAL_COMMUNITY)
Admission: EM | Admit: 2012-08-08 | Discharge: 2012-08-08 | Disposition: A | Discharge status: Home / Self Care | Payer: Medicare Other | Attending: Emergency Medicine | Admitting: Emergency Medicine

## 2012-08-08 ENCOUNTER — Emergency Department (HOSPITAL_COMMUNITY): Payer: Medicare Other

## 2012-08-08 ENCOUNTER — Encounter (HOSPITAL_COMMUNITY): Payer: Self-pay | Admitting: Emergency Medicine

## 2012-08-08 DIAGNOSIS — K219 Gastro-esophageal reflux disease without esophagitis: Secondary | ICD-10-CM | POA: Insufficient documentation

## 2012-08-08 DIAGNOSIS — Z8659 Personal history of other mental and behavioral disorders: Secondary | ICD-10-CM | POA: Insufficient documentation

## 2012-08-08 DIAGNOSIS — Z9861 Coronary angioplasty status: Secondary | ICD-10-CM | POA: Insufficient documentation

## 2012-08-08 DIAGNOSIS — I10 Essential (primary) hypertension: Secondary | ICD-10-CM | POA: Insufficient documentation

## 2012-08-08 DIAGNOSIS — S0990XA Unspecified injury of head, initial encounter: Secondary | ICD-10-CM

## 2012-08-08 DIAGNOSIS — Y939 Activity, unspecified: Secondary | ICD-10-CM | POA: Insufficient documentation

## 2012-08-08 DIAGNOSIS — Z8639 Personal history of other endocrine, nutritional and metabolic disease: Secondary | ICD-10-CM | POA: Insufficient documentation

## 2012-08-08 DIAGNOSIS — Y929 Unspecified place or not applicable: Secondary | ICD-10-CM | POA: Insufficient documentation

## 2012-08-08 DIAGNOSIS — F17200 Nicotine dependence, unspecified, uncomplicated: Secondary | ICD-10-CM | POA: Insufficient documentation

## 2012-08-08 DIAGNOSIS — Z8719 Personal history of other diseases of the digestive system: Secondary | ICD-10-CM | POA: Insufficient documentation

## 2012-08-08 DIAGNOSIS — Z8679 Personal history of other diseases of the circulatory system: Secondary | ICD-10-CM | POA: Insufficient documentation

## 2012-08-08 DIAGNOSIS — D649 Anemia, unspecified: Secondary | ICD-10-CM | POA: Insufficient documentation

## 2012-08-08 DIAGNOSIS — S0003XA Contusion of scalp, initial encounter: Secondary | ICD-10-CM

## 2012-08-08 DIAGNOSIS — Z79899 Other long term (current) drug therapy: Secondary | ICD-10-CM | POA: Insufficient documentation

## 2012-08-08 DIAGNOSIS — W1809XA Striking against other object with subsequent fall, initial encounter: Secondary | ICD-10-CM | POA: Insufficient documentation

## 2012-08-08 DIAGNOSIS — Z8739 Personal history of other diseases of the musculoskeletal system and connective tissue: Secondary | ICD-10-CM | POA: Insufficient documentation

## 2012-08-08 DIAGNOSIS — Z7982 Long term (current) use of aspirin: Secondary | ICD-10-CM | POA: Insufficient documentation

## 2012-08-08 DIAGNOSIS — Z862 Personal history of diseases of the blood and blood-forming organs and certain disorders involving the immune mechanism: Secondary | ICD-10-CM | POA: Insufficient documentation

## 2012-08-08 DIAGNOSIS — E785 Hyperlipidemia, unspecified: Secondary | ICD-10-CM | POA: Insufficient documentation

## 2012-08-08 DIAGNOSIS — I2510 Atherosclerotic heart disease of native coronary artery without angina pectoris: Secondary | ICD-10-CM | POA: Insufficient documentation

## 2012-08-08 MED ORDER — NAPROXEN 500 MG PO TABS: 500.0000 mg | ORAL_TABLET | Freq: Two times a day (BID) | ORAL | Status: DC

## 2012-08-08 NOTE — ED Notes (Signed)
Patient fell and hit head on cement.  Patient does have "goose egg" on the back of head.  She denies any LOC, has full recall of incident.  Patient is on plavix and ASA.  Patient is CAOx3.

## 2012-08-08 NOTE — ED Provider Notes (Signed)
History    CSN: [id number] Arrival date & time 08/08/12  5621  First MD Initiated Contact with Patient 08/08/12 2018     Chief Complaint  Patient presents with  . Fall  . Head Injury   (Consider location/radiation/quality/duration/timing/severity/associated sxs/prior Treatment) HPI Comments: 77 year old female who is on Plavix and aspirin presents after falling out of her car backwards when her foot got caught in her purse strap. She states that he should she struck her head on the ground causing a large hematoma to the top of her head. There was no loss of consciousness, no nausea or vomiting, no change in vision and she has been able to ambulate without difficulty since this happened approximately 30 minutes ago. She is tender to the top of her head, this is worse with palpation, no bleeding, no history of significant brain injury per the patient.  Patient is a 77 y.o. female presenting with fall and head injury. The history is provided by the patient and a relative.  Fall  Head Injury  Past Medical History  Diagnosis Date  . Duodenitis   . Gastritis   . GERD (gastroesophageal reflux disease)   . Chronic diarrhea   . IBS (irritable bowel syndrome)   . Internal hemorrhoids   . Monoclonal gammopathy   . Urge incontinence   . Benign hypertension   . DJD (degenerative joint disease)   . Depressive disorder   . Anxiety   . Hyperlipidemia   . Vitamin B12 deficiency   . Constipation   . CAD (coronary artery disease)   . Anemia    Past Surgical History  Procedure Laterality Date  . Abdominal hysterectomy  1992    with ovarian cystectomy  . Hemicolectomy    . Ileocolonic anasomosis    . Spine surgery    . Coronary stent placement     Family History  Problem Relation Age of Onset  . Kidney disease Mother   . Stroke Mother   . Diabetes      sibling  . Colon cancer Neg Hx    History  Substance Use Topics  . Smoking status: Current Every Day Smoker -- 0.50 packs/day     Types: Cigarettes  . Smokeless tobacco: Never Used  . Alcohol Use: No   OB History   Grav Para Term Preterm Abortions TAB SAB Ect Mult Living                 Review of Systems  All other systems reviewed and are negative.    Allergies  Review of patient's allergies indicates no known allergies.  Home Medications   Current Outpatient Rx  Name  Route  Sig  Dispense  Refill  . aspirin 81 MG tablet   Oral   Take 81 mg by mouth daily.           [name] atorvastatin (LIPITOR) 20 MG tablet   Oral   Take 20 mg by mouth daily.         . clopidogrel (PLAVIX) 75 MG tablet   Oral   Take 75 mg by mouth daily.         . famotidine (PEPCID) 20 MG tablet   Oral   Take 20 mg by mouth 2 (two) times daily.         . ferrous sulfate 325 (65 FE) MG tablet   Oral   Take 325 mg by mouth 2 (two) times daily.         [name]  HYDROcodone-acetaminophen (NORCO) 10-325 MG per tablet   Oral   Take by mouth. Take 1-2 tablets by mouth every 4-6 hours as needed         . LORazepam (ATIVAN) 2 MG tablet      TAKE 1 TABLET BY MOUTH THREE TIMES DAILY. MUST KEEP 08/28/12 OFFICE VISIT FOR FURTHER REFILLS!   90 tablet   1   . megestrol (MEGACE) 40 MG/ML suspension      TAKE 1 TEASPOONFUL BY MOUTH DAILY   240 mL   1   . naproxen (NAPROSYN) 500 MG tablet   Oral   Take 1 tablet (500 mg total) by mouth 2 (two) times daily with a meal.   30 tablet   0   . nitroGLYCERIN (NITROSTAT) 0.4 MG SL tablet   Sublingual   Place 0.4 mg under the tongue every 5 (five) minutes as needed. Dr. Algie Coffer          . oxybutynin (DITROPAN-XL) 5 MG 24 hr tablet   Oral   Take 5 mg by mouth daily as needed.           . potassium chloride (KLOR-CON) 10 MEQ CR tablet   Oral   Take 20 mEq by mouth daily.           BP 176/62  Pulse 83  Temp(Src) 98.1 F (36.7 C) (Oral)  Resp 20  SpO2 98% Physical Exam  Nursing note and vitals reviewed. Constitutional: She appears well-developed and well-nourished.  No distress.  HENT:  Head: Normocephalic and atraumatic.  Mouth/Throat: Oropharynx is clear and moist. No oropharyngeal exudate.  Approximate 5 cm hematoma to the crown of the head, no external bleeding, no laceration, no obvious depression of the skull, no hemotympanum, no malocclusion, no raccoon eyes or Battle sign.  Eyes: Conjunctivae and EOM are normal. Pupils are equal, round, and reactive to light. Right eye exhibits no discharge. Left eye exhibits no discharge. No scleral icterus.  Neck: Normal range of motion. Neck supple. No JVD present. No thyromegaly present.  Cardiovascular: Normal rate, regular rhythm, normal heart sounds and intact distal pulses.  Exam reveals no gallop and no friction rub.   No murmur heard. Pulmonary/Chest: Effort normal and breath sounds normal. No respiratory distress. She has no wheezes. She has no rales.  Abdominal: Soft. Bowel sounds are normal. She exhibits no distension and no mass. There is no tenderness.  Musculoskeletal: Normal range of motion. She exhibits no edema and no tenderness.  No tenderness of the cervical thoracic or lumbar spines  Lymphadenopathy:    She has no cervical adenopathy.  Neurological: She is alert. Coordination normal.  Speech is normal, coordination is normal in all 4 extremities, strength is normal in the bilateral upper and lower extremity, the patient is able to straight leg raise bilaterally, peripheral visual fields are intact, gross visual acuity is normal  Skin: Skin is warm and dry. No rash noted. No erythema.  Psychiatric: She has a normal mood and affect. Her behavior is normal.    ED Course  Procedures (including critical care time) Labs Reviewed - No data to display Ct Head Wo Contrast  08/08/2012   *RADIOLOGY REPORT*  Clinical Data: Fall with swelling.  CT HEAD WITHOUT CONTRAST  Technique:  Contiguous axial images were obtained from the base of the skull through the vertex without contrast.  Comparison: Head CT  11/08/2007.  Findings:  Calvarium: The morphology of the foramen magnum raises the possibility of previous suboccipital decompression.  There is extensive right parietal scalp swelling without underlying fracture.  Sinuses:  Mild mucosal thickening in the left maxillary antrum.  Orbits: No acute abnormality.  Brain: No evidence of acute abnormality, such as cortical infarction, hemorrhage, hydrocephalus, or mass lesion/mass effect.There is a patchy bilateral cerebral white matter low attenuation, with notable low attenuation area in the anterior limb right internal capsule that is presumably remote given history. Well defined low attenuation area in the upper cervical cord, 4 mm in diameter.  This was likely present on preceding CT 11/08/2007.  IMPRESSION:  1.  No evidence of acute intracranial injury. 2.  Chronic small vessel ischemic white matter disease. 3.  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   Original Report Authenticated By: Tiburcio Pea   1. Head injury, acute, initial encounter   2. Hematoma of scalp, initial encounter     MDM  The patient is a hematoma, she is on several different blood thinners and given her age she is high risk for intercranial hemorrhage despite minimal symptoms, CT scan pending, otherwise the patient appears stable with a normal neurologic exam.and  CT negative, pt informed, stable for d/c.  Meds given in ED:  Medications - No data to display  New Prescriptions   NAPROXEN (NAPROSYN) 500 MG TABLET    Take 1 tablet (500 mg total) by mouth 2 (two) times daily with a meal.      Vida Roller, MD 08/08/12 2259

## 2012-08-28 ENCOUNTER — Ambulatory Visit (INDEPENDENT_AMBULATORY_CARE_PROVIDER_SITE_OTHER): Payer: Medicare Other | Admitting: Internal Medicine

## 2012-08-28 ENCOUNTER — Other Ambulatory Visit (INDEPENDENT_AMBULATORY_CARE_PROVIDER_SITE_OTHER): Payer: Medicare Other

## 2012-08-28 ENCOUNTER — Encounter: Payer: Self-pay | Admitting: Internal Medicine

## 2012-08-28 VITALS — BP 110/80 | HR 100 | Ht 62.0 in | Wt 138.6 lb

## 2012-08-28 DIAGNOSIS — R933 Abnormal findings on diagnostic imaging of other parts of digestive tract: Secondary | ICD-10-CM

## 2012-08-28 DIAGNOSIS — K589 Irritable bowel syndrome without diarrhea: Secondary | ICD-10-CM

## 2012-08-28 DIAGNOSIS — D472 Monoclonal gammopathy: Secondary | ICD-10-CM

## 2012-08-28 MED ORDER — FAMOTIDINE 20 MG PO TABS: 20.0000 mg | ORAL_TABLET | Freq: Two times a day (BID) | ORAL | Status: DC

## 2012-08-28 MED ORDER — CHOLESTYRAMINE 4 G PO PACK: 1.0000 | PACK | Freq: Every day | ORAL | Status: DC

## 2012-08-28 MED ORDER — DIPHENOXYLATE-ATROPINE 2.5-0.025 MG PO TABS: 1.0000 | ORAL_TABLET | Freq: Three times a day (TID) | ORAL | Status: DC | PRN

## 2012-08-28 MED ORDER — LORAZEPAM 2 MG PO TABS: ORAL_TABLET | ORAL | Status: DC

## 2012-08-28 NOTE — Patient Instructions (Addendum)
Your physician has requested that you go to the basement for the following lab work before leaving today: A1C, Quantitative Immunoglobulin  We have sent the following medications to your pharmacy for you to pick up at your convenience: Pepcid Lomotil Questran Ativan  You have been scheduled for a CT scan of the abdomen and pelvis at [facility] ([address]---this is in the same building as Architectural technologist).   You are scheduled on __________ at __________. You should arrive 15 minutes prior to your appointment time for registration. Please follow the written instructions below on the day of your exam:  WARNING: IF YOU ARE ALLERGIC TO IODINE/X-RAY DYE, PLEASE NOTIFY RADIOLOGY IMMEDIATELY AT [phone]! YOU WILL BE GIVEN A 13 HOUR PREMEDICATION PREP.  1) Do not eat or drink anything after _______ (4 hours prior to your test) 2) You have been given 2 bottles of oral contrast to drink. The solution may taste better if refrigerated, but do NOT add ice or any other liquid to this solution. Shake well before drinking.    Drink 1 bottle of contrast @ ________ (2 hours prior to your exam)  Drink 1 bottle of contrast @ ________ (1 hour prior to your exam)  You may take any medications as prescribed with a small amount of water except for the following: Metformin, Glucophage, Glucovance, Avandamet, Riomet, Fortamet, Actoplus Met, Janumet, Glumetza or Metaglip. The above medications must be held the day of the exam AND 48 hours after the exam.  The purpose of you drinking the oral contrast is to aid in the visualization of your intestinal tract. The contrast solution may cause some diarrhea. Before your exam is started, you will be given a small amount of fluid to drink. Depending on your individual set of symptoms, you may also receive an intravenous injection of x-ray contrast/dye. Plan on being at [facility] for 30 minutes or long, depending on the type of exam you are  having performed.  This test typically takes 30-45 minutes to complete.  If you have any questions regarding your exam or if you need to reschedule, you may call the CT department at [phone] between the hours of 8:00 am and 5:00 pm, Monday-Friday.  ________________________________________________________________________  CC: Dr Orpah Cobb, Dr Arline Asp,

## 2012-08-28 NOTE — Progress Notes (Signed)
[name] [birth year] MRN [id number]        History of Present Illness:  This is a 77 year old African American female comes for refill of her medications. Specifically cholestyramine, Ativan, Lomotil, and Pepcid. She has a history of irritable bowel syndrome with predominant diarrhea. She has a history of terminal ileal resection and right hemicolectomy after incidental perforation of the bowel doing gynecological surgery in 1982. She has been treated with Lomotil and Questran. She was diagnosed with monoclonal  Kappa gammopathy about 5 years ago and has been followed by Dr. Arline Guerrero, but has not seen him for several years. She reports to me that she has not seen him since 2010.[name] Last CT scan of the abdomen and pelvis in September 2009 showed suspicious  lymphadenopathy in the pelvis. Followup CT scan was suggested. But was never done. She has had coronary artery disease and has been on Plavix., followed by Dr [name]   Past Medical History  Diagnosis Date  . Duodenitis   . Gastritis   . GERD (gastroesophageal reflux disease)   . Chronic diarrhea   . IBS (irritable bowel syndrome)   . Internal hemorrhoids   . Monoclonal gammopathy   . Urge incontinence   . Benign hypertension   . DJD (degenerative joint disease)   . Depressive disorder   . Anxiety   . Hyperlipidemia   . Vitamin B12 deficiency   . Constipation   . CAD (coronary artery disease)   . Anemia    Past Surgical History  Procedure Laterality Date  . Abdominal hysterectomy  1992    with ovarian cystectomy  . Hemicolectomy    . Ileocolonic anasomosis    . Spine surgery    . Coronary stent placement      reports that she has been smoking Cigarettes.  She has been smoking about 0.50 packs per day. She has never used smokeless tobacco. She reports that she does not drink alcohol or use illicit drugs. family history includes Diabetes in an unspecified family member; Kidney disease in her mother; and Stroke in her  mother.  There is no history of Colon cancer. No Known Allergies      Review of Systems:Denies dysphagia heartburn abdominal pain  The remainder of the 10 point ROS is negative except as outlined in H&P   Physical Exam: General appearance  Well developed, in no distress. Eyes- non icteric. HEENT nontraumatic, normocephalic. Mouth no lesions, tongue papillated, no cheilosis. Neck supple without adenopathy, thyroid not enlarged, no carotid bruits, no JVD. Lungs Clear to auscultation bilaterally.She is mild kyphosis  Cor normal S1, normal S2, regular rhythm, no murmur,  quiet precordium. Abdomen: Multiple scars. Surgeries. Normoactive bowel sounds. No distention. Mild tenderness in the right lower quadrant  Rectal:Noted  Extremities no pedal edema. Skin no lesions. Neurological alert and oriented x 3. Psychological normal mood and affect.  Assessment and Plan:  77 year old African American female with irritable bowel syndrome and  terminal ileal resection causing intermittent diarrhea. He will refill her medications. Including Ativan 2 mg 3 times a day  Monoclonal gammopathy. We will obtain a followup CT scan of the abdomen and pelvis for a lymphadenopathy and immunofixation  electrophoresis to look for Kappa gammopathy   08/28/2012 [name]

## 2012-08-29 NOTE — Addendum Note (Signed)
Addended by: Richardson Chiquito on: 08/29/2012 04:19 PM   Modules accepted: Orders

## 2012-08-30 ENCOUNTER — Telehealth: Payer: Self-pay | Admitting: Internal Medicine

## 2012-08-30 LAB — IMMUNOFIXATION ELECTROPHORESIS
IgM, Serum: 193 mg/dL (ref 52–322)
Total Protein, Serum Electrophoresis: 6.3 g/dL (ref 6.0–8.3)

## 2012-08-30 NOTE — Telephone Encounter (Signed)
Spoke with patient and gave her [facility]  number to call and reschedule.

## 2012-08-31 ENCOUNTER — Inpatient Hospital Stay: Admission: RE | Admit: 2012-08-31 | Payer: Medicare Other | Source: Ambulatory Visit

## 2012-09-06 ENCOUNTER — Other Ambulatory Visit: Payer: Medicare Other

## 2012-09-10 ENCOUNTER — Other Ambulatory Visit (INDEPENDENT_AMBULATORY_CARE_PROVIDER_SITE_OTHER): Payer: Medicare Other

## 2012-09-10 DIAGNOSIS — R933 Abnormal findings on diagnostic imaging of other parts of digestive tract: Secondary | ICD-10-CM

## 2012-09-10 DIAGNOSIS — D472 Monoclonal gammopathy: Secondary | ICD-10-CM

## 2012-09-10 DIAGNOSIS — K589 Irritable bowel syndrome without diarrhea: Secondary | ICD-10-CM

## 2012-09-10 LAB — BUN: BUN: 15 mg/dL (ref 6–23)

## 2012-09-12 ENCOUNTER — Ambulatory Visit (INDEPENDENT_AMBULATORY_CARE_PROVIDER_SITE_OTHER)
Admission: RE | Admit: 2012-09-12 | Discharge: 2012-09-12 | Disposition: A | Discharge status: Home / Self Care | Payer: Medicare Other | Source: Ambulatory Visit | Attending: Internal Medicine | Admitting: Internal Medicine

## 2012-09-12 DIAGNOSIS — R933 Abnormal findings on diagnostic imaging of other parts of digestive tract: Secondary | ICD-10-CM

## 2012-09-12 MED ORDER — IOHEXOL 300 MG/ML  SOLN
100.0000 mL | Freq: Once | INTRAMUSCULAR | Status: AC | PRN
  Administered 2012-09-12: 100 mL via INTRAVENOUS

## 2012-09-14 ENCOUNTER — Telehealth: Payer: Self-pay | Admitting: Internal Medicine

## 2012-09-14 NOTE — Telephone Encounter (Signed)
Spoke with pt, see result note.  

## 2012-09-17 ENCOUNTER — Telehealth: Payer: Self-pay | Admitting: Internal Medicine

## 2012-09-17 ENCOUNTER — Telehealth: Payer: Self-pay | Admitting: Hematology and Oncology

## 2012-09-17 NOTE — Telephone Encounter (Signed)
S/W PT IN RE TO NP APPT 09/17 @ 3 W/DR. VICTOR REFERRING DR. Juanda Chance DX- MONOCLONAL GAMMOPATHY WELCOME PACKET MAILED

## 2012-09-17 NOTE — Telephone Encounter (Signed)
Spoke with patient and she was told to call [facility] to schedule an appointment to f/u CT scan. (She has been seen by Dr. Arline Asp in past) Patient states she will call and schedule OV.

## 2012-09-18 ENCOUNTER — Telehealth: Payer: Self-pay

## 2012-09-18 NOTE — Telephone Encounter (Signed)
C/D 09/18/12 for appt. 10/10/12

## 2012-10-08 ENCOUNTER — Telehealth: Payer: Self-pay | Admitting: Hematology and Oncology

## 2012-10-08 NOTE — Telephone Encounter (Signed)
S/w pt son Oswaldo Done and gve new d/t for NP  09/29 @ 10:30 per son he will gve appt to sister.

## 2012-10-10 ENCOUNTER — Ambulatory Visit: Payer: Medicare Other

## 2012-10-22 ENCOUNTER — Encounter: Payer: Self-pay | Admitting: Hematology and Oncology

## 2012-10-22 ENCOUNTER — Other Ambulatory Visit (HOSPITAL_COMMUNITY)
Admission: RE | Admit: 2012-10-22 | Discharge: 2012-10-22 | Disposition: A | Discharge status: Home / Self Care | Payer: Medicare Other | Source: Ambulatory Visit | Attending: Hematology and Oncology | Admitting: Hematology and Oncology

## 2012-10-22 ENCOUNTER — Ambulatory Visit: Payer: Medicare Other

## 2012-10-22 ENCOUNTER — Ambulatory Visit (HOSPITAL_BASED_OUTPATIENT_CLINIC_OR_DEPARTMENT_OTHER): Payer: Medicare Other | Admitting: Hematology and Oncology

## 2012-10-22 ENCOUNTER — Telehealth: Payer: Self-pay | Admitting: Hematology and Oncology

## 2012-10-22 ENCOUNTER — Ambulatory Visit (HOSPITAL_BASED_OUTPATIENT_CLINIC_OR_DEPARTMENT_OTHER): Payer: Medicare Other | Admitting: Lab

## 2012-10-22 VITALS — BP 143/84 | HR 107 | Temp 97.8°F | Resp 18 | Ht 62.0 in | Wt 139.7 lb

## 2012-10-22 DIAGNOSIS — R897 Abnormal histological findings in specimens from other organs, systems and tissues: Secondary | ICD-10-CM | POA: Insufficient documentation

## 2012-10-22 DIAGNOSIS — R599 Enlarged lymph nodes, unspecified: Secondary | ICD-10-CM

## 2012-10-22 DIAGNOSIS — F17200 Nicotine dependence, unspecified, uncomplicated: Secondary | ICD-10-CM

## 2012-10-22 DIAGNOSIS — R591 Generalized enlarged lymph nodes: Secondary | ICD-10-CM

## 2012-10-22 DIAGNOSIS — E538 Deficiency of other specified B group vitamins: Secondary | ICD-10-CM

## 2012-10-22 DIAGNOSIS — D472 Monoclonal gammopathy: Secondary | ICD-10-CM | POA: Insufficient documentation

## 2012-10-22 DIAGNOSIS — D649 Anemia, unspecified: Secondary | ICD-10-CM

## 2012-10-22 HISTORY — DX: Generalized enlarged lymph nodes: R59.1

## 2012-10-22 LAB — CBC WITH DIFFERENTIAL/PLATELET
BASO%: 1.2 % (ref 0.0–2.0)
EOS%: 3.2 % (ref 0.0–7.0)
HCT: 38 % (ref 34.8–46.6)
LYMPH%: 43.6 % (ref 14.0–49.7)
MCH: 32.8 pg (ref 25.1–34.0)
MCHC: 33.6 g/dL (ref 31.5–36.0)
NEUT%: 46.1 % (ref 38.4–76.8)
Platelets: 156 10*3/uL (ref 145–400)
RBC: 3.9 10*6/uL (ref 3.70–5.45)
WBC: 6.8 10*3/uL (ref 3.9–10.3)

## 2012-10-22 LAB — COMPREHENSIVE METABOLIC PANEL (CC13)
ALT: 12 U/L (ref 0–55)
AST: 16 U/L (ref 5–34)
Alkaline Phosphatase: 73 U/L (ref 40–150)
Creatinine: 0.9 mg/dL (ref 0.6–1.1)
Sodium: 141 mEq/L (ref 136–145)
Total Bilirubin: 0.69 mg/dL (ref 0.20–1.20)
Total Protein: 7.4 g/dL (ref 6.4–8.3)

## 2012-10-22 NOTE — Progress Notes (Signed)
[facility] CONSULT NOTE  Ricki Rodriguez, MD   CHIEF COMPLAINTS/PURPOSE OF CONSULTATION: History of MGUS, lymphadenopathy, for further management  HISTORY OF PRESENTING ILLNESS:  [name] 77 y.o. female is a patient of Dr. Arline Asp who was seen here many years ago for complaints above. The patient herself is a bit agitated today. She is not sure why she is here being seen. She does not recall the reason she saw a hematologist many years ago. The only known hematology problem that she is aware of his history of vitamin B12 deficiency. Her last B12 injection was over 2 years ago. She was placed on oral ion supplements for some time for history of anemia. She denies any recent epistaxis, hematuria, or hematochezia. She has loss of appetite and weight for the last 3 weeks she attributed to anxiety. The patient had history of chronic diarrhea but over the last few months she is actually a little bit constipated. She denies any new areas of bone pain. She does have a history of chronic back pain but that has not changed. Denies any new palpable adenopathy  MEDICAL HISTORY:  Past Medical History  Diagnosis Date  . Duodenitis   . Gastritis   . GERD (gastroesophageal reflux disease)   . Chronic diarrhea   . IBS (irritable bowel syndrome)   . Internal hemorrhoids   . Monoclonal gammopathy   . Urge incontinence   . Benign hypertension   . DJD (degenerative joint disease)   . Depressive disorder   . Anxiety   . Hyperlipidemia   . Vitamin B12 deficiency   . Constipation   . CAD (coronary artery disease)   . Anemia   . Lymphadenopathy 10/22/2012    SURGICAL HISTORY: Past Surgical History  Procedure Laterality Date  . Abdominal hysterectomy  1992    with ovarian cystectomy  . Hemicolectomy    . Ileocolonic anasomosis    . Spine surgery    . Coronary stent placement      SOCIAL HISTORY: History   Social History  . Marital Status: Married    Spouse Name: N/A   Number of Children: 2  . Years of Education: N/A   Occupational History  . retired    Social History Main Topics  . Smoking status: Current Every Day Smoker -- 0.50 packs/day    Types: Cigarettes  . Smokeless tobacco: Never Used  . Alcohol Use: No  . Drug Use: No  . Sexual Activity: Not on file   Other Topics Concern  . Not on file   Social History Narrative  . No narrative on file    FAMILY HISTORY: Family History  Problem Relation Age of Onset  . Kidney disease Mother   . Stroke Mother   . Diabetes      sibling  . Colon cancer Neg Hx     ALLERGIES:  has No Known Allergies.  MEDICATIONS: Current outpatient prescriptions:aspirin 81 MG tablet, Take 81 mg by mouth daily.  , Disp: , Rfl: ;  atorvastatin (LIPITOR) 20 MG tablet, Take 20 mg by mouth daily., Disp: , Rfl: ;  cholestyramine (QUESTRAN) 4 G packet, Take 1 packet by mouth daily., Disp: 30 each, Rfl: 1;  clopidogrel (PLAVIX) 75 MG tablet, Take 75 mg by mouth daily., Disp: , Rfl: ;  diltiazem (CARDIZEM) 30 MG tablet, Take 30 mg by mouth daily., Disp: , Rfl:  diphenoxylate-atropine (LOMOTIL) 2.5-0.025 MG per tablet, Take 1 tablet by mouth 3 (three) times daily as needed  for diarrhea or loose stools., Disp: 30 tablet, Rfl: 0;  famotidine (PEPCID) 20 MG tablet, Take 1 tablet (20 mg total) by mouth 2 (two) times daily., Disp: 60 tablet, Rfl: 1;  ferrous sulfate 325 (65 FE) MG tablet, Take 325 mg by mouth 2 (two) times daily., Disp: , Rfl:  HYDROcodone-acetaminophen (NORCO) 10-325 MG per tablet, Take by mouth. Take 1-2 tablets by mouth every 4-6 hours as needed, Disp: , Rfl: ;  Hydrocodone-APAP-Dietary Prod (HYDROCODONE-APAP-NUTRIT SUPP) 10-325 MG MISC, Take 10-325 mg by mouth every 6 (six) hours as needed., Disp: , Rfl: ;  LORazepam (ATIVAN) 2 MG tablet, TAKE 1 TABLET BY MOUTH THREE TIMES DAILY. NOT TO BE FILLED UNTIL 09/21/12, Disp: 90 tablet, Rfl: 1 losartan (COZAAR) 100 MG tablet, Take 100 mg by mouth daily., Disp: , Rfl: ;   megestrol (MEGACE) 40 MG/ML suspension, TAKE 1 TEASPOONFUL BY MOUTH DAILY, Disp: 240 mL, Rfl: 1;  metoprolol tartrate (LOPRESSOR) 25 MG tablet, Take 25 mg by mouth daily., Disp: , Rfl: ;  naproxen (NAPROSYN) 500 MG tablet, Take 1 tablet (500 mg total) by mouth 2 (two) times daily with a meal., Disp: 30 tablet, Rfl: 0 nitroGLYCERIN (NITROSTAT) 0.4 MG SL tablet, Place 0.4 mg under the tongue every 5 (five) minutes as needed. Dr. Algie Coffer , Disp: , Rfl: ;  oxybutynin (DITROPAN-XL) 5 MG 24 hr tablet, Take 5 mg by mouth daily as needed.  , Disp: , Rfl: ;  potassium chloride (KLOR-CON) 10 MEQ CR tablet, Take 20 mEq by mouth daily. , Disp: , Rfl:   REVIEW OF SYSTEMS:   Constitutional: Denies fevers, chills  Eyes: Denies blurriness of vision, double vision or watery eyes Ears, nose, mouth, throat, and face: Denies mucositis or sore throat Respiratory: Denies cough, dyspnea or wheezes Cardiovascular: Denies palpitation, chest discomfort or lower extremity swelling Gastrointestinal:  Denies nausea, heartburn or change in bowel habits Skin: Denies abnormal skin rashes Lymphatics: Denies new lymphadenopathy or easy bruising Neurological:Denies numbness, tingling or new weaknesses Behavioral/Psych: Mood is stable, no new changes  All other systems were reviewed with the patient and are negative.  PHYSICAL EXAMINATION: ECOG PERFORMANCE STATUS: 1 - Symptomatic but completely ambulatory  Filed Vitals:   10/22/12 1103  BP: 143/84  Pulse: 107  Temp: 97.8 F (36.6 C)  Resp: 18   Filed Weights   10/22/12 1103  Weight: 139 lb 11.2 oz (63.368 kg)    GENERAL:alert, no distress and comfortable SKIN: skin color, texture, turgor are normal, no rashes or significant lesions EYES: normal, conjunctiva are pink and non-injected, sclera clear OROPHARYNX:no exudate, no erythema and lips, buccal mucosa, and tongue normal  NECK: supple, thyroid normal size, non-tender, without nodularity LYMPH:  no palpable  lymphadenopathy in the cervical, axillary or inguinal LUNGS: clear to auscultation and percussion with normal breathing effort HEART: regular rate & rhythm and no murmurs and no lower extremity edema ABDOMEN:abdomen soft, non-tender and normal bowel sounds no splenomegaly Musculoskeletal:no cyanosis of digits and no clubbing  PSYCH: alert & oriented x 3 with fluent speech NEURO: no focal motor/sensory deficits  LABORATORY DATA:  I have reviewed the data as listed Recent Results (from the past 2160 hour(s))  HEMOGLOBIN A1C     Status: None   Collection Time    08/28/12  5:21 PM      Result Value Range   Hemoglobin A1C 5.9  4.6 - 6.5 %   Comment: Glycemic Control Guidelines for People with Diabetes:Non Diabetic:  <6%Goal of Therapy: <7%Additional Action Suggested:  >  8%   IMMUNOFIXATION ELECTROPHORESIS     Status: None   Collection Time    08/28/12  5:32 PM      Result Value Range   Total Protein, Serum Electrophoresis 6.3  6.0 - 8.3 g/dL   IgG (Immunoglobin G), Serum 1170  690 - 1700 mg/dL   IgA 952  69 - 841 mg/dL   IgM, Serum 324  52 - 322 mg/dL   Immunofix Electr Int       Comment: No monoclonal protein identified.     Reviewed by Dallas Breeding, MD, PhD, FCAP (Electronic Signature on     File)  BUN     Status: None   Collection Time    09/10/12  4:38 PM      Result Value Range   BUN 15  6 - 23 mg/dL  CREATININE, SERUM     Status: None   Collection Time    09/10/12  4:38 PM      Result Value Range   Creatinine, Ser 0.9  0.4 - 1.2 mg/dL    RADIOGRAPHIC STUDIES: I have personally reviewed the radiological images as listed and agreed with the findings in the report. I reviewed the CT scan with her and her son regarding the diffuse lymphadenopathy seen in the abdomen  ASSESSMENT:  History of MGUS, progressive adenopathy, history of smoking, weight loss  PLAN:  #1 history of MGUS The patient had history of anemia which has resolved. She has chronic musculoskeletal  pain which has not progressed. A more recent immunoglobulin levels were within normal limits. I will do additional testing regarding this #2 history of anemia #3 history of B12 deficiency I will addition a workup for anemia history of B12 deficiency. She was taking oral ion supplements and asked her to discontinue that. The most recent hemoglobin were within normal limits #4 weight loss #5 diffuse lymphadenopathy #6 smoking history Concern given the significant lymphadenopathy whether she has progression of lymphoma or some undiagnosed cancer. I discussed with the patient importance of nicotine cessation but she is not interested today. I recommend a PET/CT scan to stage her for lymphoma. She is in agreement. All questions were answered. The patient knows to call the clinic with any problems, questions or concerns. I can certainly see the patient much sooner if necessary. No barriers to learning was detected.    [facility], Neima Lacross, MD 10/22/2012 12:04 PM

## 2012-10-22 NOTE — Telephone Encounter (Signed)
gave pt appt for md only  in October and sent pt to labs today

## 2012-10-22 NOTE — Progress Notes (Signed)
Checked in new patient with no financial issues. Mail and phone only for communication. °

## 2012-10-24 LAB — SEDIMENTATION RATE: Sed Rate: 6 mm/hr (ref 0–22)

## 2012-10-24 LAB — SPEP & IFE WITH QIG
Albumin ELP: 55.2 % — ABNORMAL LOW (ref 55.8–66.1)
Alpha-1-Globulin: 4.9 % (ref 2.9–4.9)
Alpha-2-Globulin: 9.8 % (ref 7.1–11.8)
Beta 2: 5.2 % (ref 3.2–6.5)
Beta Globulin: 6.1 % (ref 4.7–7.2)
Gamma Globulin: 18.8 % (ref 11.1–18.8)
IgA: 242 mg/dL (ref 69–380)
IgG (Immunoglobin G), Serum: 1230 mg/dL (ref 690–1700)
IgM, Serum: 196 mg/dL (ref 52–322)
Total Protein, Serum Electrophoresis: 7 g/dL (ref 6.0–8.3)

## 2012-10-24 LAB — KAPPA/LAMBDA LIGHT CHAINS
Kappa:Lambda Ratio: 0.95 (ref 0.26–1.65)
Lambda Free Lght Chn: 3.1 mg/dL — ABNORMAL HIGH (ref 0.57–2.63)

## 2012-10-24 LAB — VITAMIN B12: Vitamin B-12: 403 pg/mL (ref 211–911)

## 2012-10-24 LAB — BETA 2 MICROGLOBULIN, SERUM: Beta-2 Microglobulin: 1.82 mg/L — ABNORMAL HIGH (ref 1.01–1.73)

## 2012-10-30 ENCOUNTER — Emergency Department (HOSPITAL_COMMUNITY)
Admission: EM | Admit: 2012-10-30 | Discharge: 2012-10-30 | Disposition: A | Discharge status: Home / Self Care | Payer: Medicare Other | Attending: Emergency Medicine | Admitting: Emergency Medicine

## 2012-10-30 ENCOUNTER — Encounter (HOSPITAL_COMMUNITY): Payer: Self-pay | Admitting: Emergency Medicine

## 2012-10-30 ENCOUNTER — Emergency Department (HOSPITAL_COMMUNITY): Payer: Medicare Other

## 2012-10-30 DIAGNOSIS — Z8739 Personal history of other diseases of the musculoskeletal system and connective tissue: Secondary | ICD-10-CM | POA: Insufficient documentation

## 2012-10-30 DIAGNOSIS — J029 Acute pharyngitis, unspecified: Secondary | ICD-10-CM | POA: Insufficient documentation

## 2012-10-30 DIAGNOSIS — Z79899 Other long term (current) drug therapy: Secondary | ICD-10-CM | POA: Insufficient documentation

## 2012-10-30 DIAGNOSIS — F17200 Nicotine dependence, unspecified, uncomplicated: Secondary | ICD-10-CM | POA: Insufficient documentation

## 2012-10-30 DIAGNOSIS — Z7902 Long term (current) use of antithrombotics/antiplatelets: Secondary | ICD-10-CM | POA: Insufficient documentation

## 2012-10-30 DIAGNOSIS — K219 Gastro-esophageal reflux disease without esophagitis: Secondary | ICD-10-CM | POA: Insufficient documentation

## 2012-10-30 DIAGNOSIS — F3289 Other specified depressive episodes: Secondary | ICD-10-CM | POA: Insufficient documentation

## 2012-10-30 DIAGNOSIS — I2510 Atherosclerotic heart disease of native coronary artery without angina pectoris: Secondary | ICD-10-CM | POA: Insufficient documentation

## 2012-10-30 DIAGNOSIS — Z8639 Personal history of other endocrine, nutritional and metabolic disease: Secondary | ICD-10-CM | POA: Insufficient documentation

## 2012-10-30 DIAGNOSIS — I10 Essential (primary) hypertension: Secondary | ICD-10-CM | POA: Insufficient documentation

## 2012-10-30 DIAGNOSIS — F411 Generalized anxiety disorder: Secondary | ICD-10-CM | POA: Insufficient documentation

## 2012-10-30 DIAGNOSIS — Z7982 Long term (current) use of aspirin: Secondary | ICD-10-CM | POA: Insufficient documentation

## 2012-10-30 DIAGNOSIS — J40 Bronchitis, not specified as acute or chronic: Secondary | ICD-10-CM | POA: Insufficient documentation

## 2012-10-30 DIAGNOSIS — Z792 Long term (current) use of antibiotics: Secondary | ICD-10-CM | POA: Insufficient documentation

## 2012-10-30 DIAGNOSIS — Z9861 Coronary angioplasty status: Secondary | ICD-10-CM | POA: Insufficient documentation

## 2012-10-30 DIAGNOSIS — F329 Major depressive disorder, single episode, unspecified: Secondary | ICD-10-CM | POA: Insufficient documentation

## 2012-10-30 DIAGNOSIS — Z862 Personal history of diseases of the blood and blood-forming organs and certain disorders involving the immune mechanism: Secondary | ICD-10-CM | POA: Insufficient documentation

## 2012-10-30 DIAGNOSIS — J069 Acute upper respiratory infection, unspecified: Secondary | ICD-10-CM | POA: Insufficient documentation

## 2012-10-30 MED ORDER — DEXTROMETHORPHAN HBR 15 MG/5ML PO SYRP: 10.0000 mL | ORAL_SOLUTION | Freq: Four times a day (QID) | ORAL | Status: DC | PRN

## 2012-10-30 MED ORDER — AZITHROMYCIN 250 MG PO TABS: 250.0000 mg | ORAL_TABLET | Freq: Every day | ORAL | Status: DC

## 2012-10-30 NOTE — ED Provider Notes (Signed)
CSN: [id number]     Arrival date & time 10/30/12  1651 History   First MD Initiated Contact with Patient 10/30/12 2020     Chief Complaint  Patient presents with  . URI  . Cough   (Consider location/radiation/quality/duration/timing/severity/associated sxs/prior Treatment) HPI Pt is a 77yo female with URI symptoms x 1 week with productive cough. Pt c/o moderate productive cough that started last Monday, 9/29 and has not improved with OTC cough medicine.  Pt also c/o bilateral ear pain and mild sore throat, worse with cough.  Reports husband was recently admitted for pneumonia.  Pt is a 1/2ppd smoker but denies has of asthma or COPD.  Reports hot and cold chills but no known fever. Denies n/v/d. Reports still having a good appetite.   Past Medical History  Diagnosis Date  . Duodenitis   . Gastritis   . GERD (gastroesophageal reflux disease)   . Chronic diarrhea   . IBS (irritable bowel syndrome)   . Internal hemorrhoids   . Monoclonal gammopathy   . Urge incontinence   . Benign hypertension   . DJD (degenerative joint disease)   . Depressive disorder   . Anxiety   . Hyperlipidemia   . Vitamin B12 deficiency   . Constipation   . CAD (coronary artery disease)   . Anemia   . Lymphadenopathy 10/22/2012   Past Surgical History  Procedure Laterality Date  . Abdominal hysterectomy  1992    with ovarian cystectomy  . Hemicolectomy    . Ileocolonic anasomosis    . Spine surgery    . Coronary stent placement     Family History  Problem Relation Age of Onset  . Kidney disease Mother   . Stroke Mother   . Diabetes      sibling  . Colon cancer Neg Hx    History  Substance Use Topics  . Smoking status: Current Every Day Smoker -- 0.50 packs/day    Types: Cigarettes  . Smokeless tobacco: Never Used  . Alcohol Use: No   OB History   Grav Para Term Preterm Abortions TAB SAB Ect Mult Living                 Review of Systems  Constitutional: Negative for fever, chills and  appetite change.  HENT: Positive for congestion, ear pain, rhinorrhea and sore throat. Negative for trouble swallowing and voice change.   Respiratory: Positive for cough. Negative for shortness of breath.   Cardiovascular: Negative for chest pain.  Gastrointestinal: Negative for nausea, vomiting, abdominal pain and diarrhea.  All other systems reviewed and are negative.     Allergies  Review of patient's allergies indicates no known allergies.  Home Medications   Current Outpatient Rx  Name  Route  Sig  Dispense  Refill  . aspirin 81 MG tablet   Oral   Take 81 mg by mouth daily.           [name] atorvastatin (LIPITOR) 20 MG tablet   Oral   Take 20 mg by mouth daily.         . cholestyramine (QUESTRAN) 4 G packet   Oral   Take 1 packet by mouth daily.   30 each   1   . clopidogrel (PLAVIX) 75 MG tablet   Oral   Take 75 mg by mouth daily.         [name] diltiazem (CARDIZEM) 30 MG tablet   Oral   Take 30 mg by mouth daily.         [name]  famotidine (PEPCID) 20 MG tablet   Oral   Take 1 tablet (20 mg total) by mouth 2 (two) times daily.   60 tablet   1   . ferrous sulfate 325 (65 FE) MG tablet   Oral   Take 325 mg by mouth 2 (two) times daily.         [name] HYDROcodone-acetaminophen (NORCO) 10-325 MG per tablet   Oral   Take 1-2 tablets by mouth every 4 (four) hours as needed for pain.          [name] LORazepam (ATIVAN) 2 MG tablet   Oral   Take 2 mg by mouth every 8 (eight) hours as needed for anxiety.         [name] losartan (COZAAR) 100 MG tablet   Oral   Take 100 mg by mouth daily.         . megestrol (MEGACE) 40 MG/ML suspension   Oral   Take 200 mg by mouth 2 (two) times a week. No specific days of the week         . metoprolol tartrate (LOPRESSOR) 25 MG tablet   Oral   Take 25 mg by mouth daily.         . naproxen (NAPROSYN) 500 MG tablet   Oral   Take 1 tablet (500 mg total) by mouth 2 (two) times daily with a meal.   30 tablet   0   .  nitroGLYCERIN (NITROSTAT) 0.4 MG SL tablet   Sublingual   Place 0.4 mg under the tongue every 5 (five) minutes as needed for chest pain.          [name] oxybutynin (DITROPAN-XL) 5 MG 24 hr tablet   Oral   Take 5 mg by mouth daily as needed (Urinary Incontinence).          . potassium chloride (KLOR-CON) 10 MEQ CR tablet   Oral   Take 20 mEq by mouth daily.          [name] azithromycin (ZITHROMAX) 250 MG tablet   Oral   Take 1 tablet (250 mg total) by mouth daily. Take first 2 tablets together, then 1 every day until finished.   6 tablet   0   . dextromethorphan 15 MG/5ML syrup   Oral   Take 10 mLs (30 mg total) by mouth 4 (four) times daily as needed for cough.   120 mL   0    BP 141/71  Pulse 98  Temp(Src) 98.7 F (37.1 C) (Oral)  Resp 18  Ht 5\' 2"  (1.575 m)  Wt 139 lb (63.05 kg)  BMI 25.42 kg/m2  SpO2 99% Physical Exam  Nursing note and vitals reviewed. Constitutional: She appears well-developed and well-nourished. No distress.  HENT:  Head: Normocephalic and atraumatic.  Right Ear: Hearing, tympanic membrane, external ear and ear canal normal.  Left Ear: Hearing, tympanic membrane, external ear and ear canal normal.  Nose: Mucosal edema and rhinorrhea present.  Mouth/Throat: Uvula is midline and mucous membranes are normal. Posterior oropharyngeal erythema present. No oropharyngeal exudate, posterior oropharyngeal edema or tonsillar abscesses.  Eyes: Conjunctivae are normal. No scleral icterus.  Neck: Normal range of motion. Neck supple.  Cardiovascular: Normal rate, regular rhythm and normal heart sounds.   Pulmonary/Chest: Effort normal and breath sounds normal. No respiratory distress. She has no wheezes. She has no rales. She exhibits no tenderness.  No respiratory distress, able to speak in full sentences w/o difficulty. Lungs: CTAB.  Intermittent productive cough.  Abdominal: Soft. Bowel sounds are normal. She exhibits no distension and no mass. There is no  tenderness. There is no rebound and no guarding.  Musculoskeletal: Normal range of motion.  Neurological: She is alert.  Skin: Skin is warm and dry. She is not diaphoretic.    ED Course  Procedures (including critical care time) Labs Review Labs Reviewed - No data to display Imaging Review Dg Chest 2 View  10/30/2012   CLINICAL DATA:  Upper respiratory tract infection. Cough  EXAM: CHEST  2 VIEW  COMPARISON:  Chest radiograph 06/25/2012  FINDINGS: Stable cardiac and mediastinal contours. No consolidative pulmonary opacities. No pleural effusion or pneumothorax. Left shoulder joint degenerative change.  IMPRESSION: No acute cardiopulmonary process   Electronically Signed   By: Annia Belt M.D.   On: 10/30/2012 17:51    MDM   1. URI, acute   2. Bronchitis    Pt is a 77yo female hx of smoking, presenting with 1 week hx of moderately productive cough. No hx of asthma or COPD but hx of sick contact, husband recently admitted for pneumonia.  Pt appears mildly fatigued but no respiratory distress. Vitals: unremarkable. Afebrile, O2 99%  CXR: normal.  Lungs: CTAB.  Pt has intermittent productive cough during exam.  Due to pt's age and persistent cough, and smoker, will tx for bacterial bronchitis. Rx: azithromycin.    All labs/imaging/findings discussed with patient. All questions answered and concerns addressed. Will discharge pt home and have pt f/u with her PCP. Return precautions given. Pt verbalized understanding and agreement with tx plan. Vitals: unremarkable. Discharged in stable condition.    Discussed pt with attending during ED encounter and agrees with plan.       Junius Finner, PA-C 10/31/12 [phone]

## 2012-10-30 NOTE — ED Notes (Signed)
Let the pt know that the MD is very busy and will be there asap please be patient

## 2012-10-30 NOTE — ED Notes (Signed)
Pt c/o URI sx with productive cough and clear sputum x 1 week

## 2012-11-02 NOTE — ED Provider Notes (Signed)
Medical screening examination/treatment/procedure(s) were performed by non-physician practitioner and as supervising physician I was immediately available for consultation/collaboration.  Jaydian Santana R. Anshu Wehner, MD 11/02/12 1019 

## 2012-11-05 ENCOUNTER — Encounter (HOSPITAL_COMMUNITY): Payer: Medicare Other

## 2012-11-09 ENCOUNTER — Ambulatory Visit: Payer: Medicare Other | Admitting: Hematology and Oncology

## 2012-11-12 ENCOUNTER — Telehealth: Payer: Self-pay | Admitting: Hematology and Oncology

## 2012-11-12 NOTE — Telephone Encounter (Signed)
Returned pt's call re r/s 10/17 appt. Per pt she has been sick and her husband had a heart attack so she just could not make it. gv pt new appt for 10/29 and reminded her of her pet scan appt this Friday 10/24. Per pt she will try to make it. Message to NG.

## 2012-11-16 ENCOUNTER — Encounter (HOSPITAL_COMMUNITY): Payer: Self-pay

## 2012-11-16 ENCOUNTER — Encounter (HOSPITAL_COMMUNITY)
Admission: RE | Admit: 2012-11-16 | Discharge: 2012-11-16 | Disposition: A | Discharge status: Home / Self Care | Payer: Medicare Other | Source: Ambulatory Visit | Attending: Hematology and Oncology | Admitting: Hematology and Oncology

## 2012-11-16 DIAGNOSIS — I2510 Atherosclerotic heart disease of native coronary artery without angina pectoris: Secondary | ICD-10-CM | POA: Insufficient documentation

## 2012-11-16 DIAGNOSIS — R591 Generalized enlarged lymph nodes: Secondary | ICD-10-CM

## 2012-11-16 DIAGNOSIS — E538 Deficiency of other specified B group vitamins: Secondary | ICD-10-CM

## 2012-11-16 DIAGNOSIS — F17200 Nicotine dependence, unspecified, uncomplicated: Secondary | ICD-10-CM

## 2012-11-16 DIAGNOSIS — D472 Monoclonal gammopathy: Secondary | ICD-10-CM

## 2012-11-16 DIAGNOSIS — R599 Enlarged lymph nodes, unspecified: Secondary | ICD-10-CM | POA: Insufficient documentation

## 2012-11-16 DIAGNOSIS — C8589 Other specified types of non-Hodgkin lymphoma, extranodal and solid organ sites: Secondary | ICD-10-CM | POA: Insufficient documentation

## 2012-11-16 LAB — GLUCOSE, CAPILLARY: Glucose-Capillary: 97 mg/dL (ref 70–99)

## 2012-11-16 MED ORDER — FLUDEOXYGLUCOSE F - 18 (FDG) INJECTION
17.8000 | Freq: Once | INTRAVENOUS | Status: AC | PRN
  Administered 2012-11-16: 17.8 via INTRAVENOUS

## 2012-11-21 ENCOUNTER — Ambulatory Visit (HOSPITAL_BASED_OUTPATIENT_CLINIC_OR_DEPARTMENT_OTHER): Payer: Medicare Other | Admitting: Hematology and Oncology

## 2012-11-21 ENCOUNTER — Encounter: Payer: Self-pay | Admitting: Hematology and Oncology

## 2012-11-21 VITALS — BP 139/79 | HR 80 | Temp 97.8°F | Resp 20 | Ht 62.0 in | Wt 139.2 lb

## 2012-11-21 DIAGNOSIS — F17200 Nicotine dependence, unspecified, uncomplicated: Secondary | ICD-10-CM

## 2012-11-21 DIAGNOSIS — F329 Major depressive disorder, single episode, unspecified: Secondary | ICD-10-CM

## 2012-11-21 DIAGNOSIS — D472 Monoclonal gammopathy: Secondary | ICD-10-CM

## 2012-11-21 DIAGNOSIS — E538 Deficiency of other specified B group vitamins: Secondary | ICD-10-CM

## 2012-11-21 NOTE — Progress Notes (Signed)
[facility] OFFICE PROGRESS NOTE  Patient Care Team: Ricki Rodriguez, MD as PCP - General (Internal Medicine)  DIAGNOSIS: History of MGUS and B12 deficiency  SUMMARY OF ONCOLOGIC HISTORY: This is a pleasant 77 year old lady whose been followed here for history of MGUS and B12 deficiency anemia.   INTERVAL HISTORY: [name] 77 y.o. female returns for Further followup. She complained of feeling fatigued. The daughters state that the patient does not sleep well at night. The patient was placed on oral iron supplement for many years due to history of anemia. She's constipated from it. She denies any new bone pain. She denies any recent fever, chills, night sweats or abnormal weight loss The patient denies any recent signs or symptoms of bleeding such as spontaneous epistaxis, hematuria or hematochezia.  I have reviewed the past medical history, past surgical history, social history and family history with the patient and they are unchanged from previous note.  ALLERGIES:  has No Known Allergies.  MEDICATIONS:  Current Outpatient Prescriptions  Medication Sig Dispense Refill  . aspirin 81 MG tablet Take 81 mg by mouth daily.        [name] atorvastatin (LIPITOR) 20 MG tablet Take 20 mg by mouth daily.      . cholestyramine (QUESTRAN) 4 G packet Take 1 packet by mouth daily.  30 each  1  . clopidogrel (PLAVIX) 75 MG tablet Take 75 mg by mouth daily.      [name] dextromethorphan 15 MG/5ML syrup Take 10 mLs (30 mg total) by mouth 4 (four) times daily as needed for cough.  120 mL  0  . diltiazem (CARDIZEM) 30 MG tablet Take 30 mg by mouth daily.      . famotidine (PEPCID) 20 MG tablet Take 1 tablet (20 mg total) by mouth 2 (two) times daily.  60 tablet  1  . HYDROcodone-acetaminophen (NORCO) 10-325 MG per tablet Take 1-2 tablets by mouth every 4 (four) hours as needed for pain.       [name] LORazepam (ATIVAN) 2 MG tablet Take 2 mg by mouth every 8 (eight) hours as needed for anxiety.       [name] losartan (COZAAR) 100 MG tablet Take 100 mg by mouth daily.      . megestrol (MEGACE) 40 MG/ML suspension Take 200 mg by mouth 2 (two) times a week. No specific days of the week      . metoprolol tartrate (LOPRESSOR) 25 MG tablet Take 25 mg by mouth daily.      . naproxen (NAPROSYN) 500 MG tablet Take 1 tablet (500 mg total) by mouth 2 (two) times daily with a meal.  30 tablet  0  . nitroGLYCERIN (NITROSTAT) 0.4 MG SL tablet Place 0.4 mg under the tongue every 5 (five) minutes as needed for chest pain.       [name] oxybutynin (DITROPAN-XL) 5 MG 24 hr tablet Take 5 mg by mouth daily as needed (Urinary Incontinence).       . potassium chloride (KLOR-CON) 10 MEQ CR tablet Take 20 mEq by mouth daily.       . ferrous sulfate 325 (65 FE) MG tablet Take 325 mg by mouth 2 (two) times daily.       No current facility-administered medications for this visit.    REVIEW OF SYSTEMS:   Constitutional: Denies fevers, chills or abnormal weight loss Eyes: Denies blurriness of vision Ears, nose, mouth, throat, and face: Denies mucositis or sore throat Respiratory: Denies cough, dyspnea or  wheezes Cardiovascular: Denies palpitation, chest discomfort or lower extremity swelling Skin: Denies abnormal skin rashes Lymphatics: Denies new lymphadenopathy or easy bruising Neurological:Denies numbness, tingling or new weaknesses Behavioral/Psych: Mood is stable, no new changes  All other systems were reviewed with the patient and are negative.  PHYSICAL EXAMINATION: ECOG PERFORMANCE STATUS: 1 - Symptomatic but completely ambulatory  Filed Vitals:   11/21/12 1340  BP: 139/79  Pulse: 80  Temp: 97.8 F (36.6 C)  Resp: 20   Filed Weights   11/21/12 1340  Weight: 139 lb 3.2 oz (63.141 kg)    GENERAL:alert, no distress and comfortable. The patient will elderly. She looks thin but not cachectic.  SKIN: skin color, texture, turgor are normal, no rashes or significant lesions EYES: normal, Conjunctiva are pink  and non-injected, sclera clear OROPHARYNX:no exudate, no erythema and lips, buccal mucosa, and tongue normal  NECK: supple, thyroid normal size, non-tender, without nodularity LYMPH:  no palpable lymphadenopathy in the cervical, axillary or inguinal LUNGS: clear to auscultation and percussion with normal breathing effort HEART: regular rate & rhythm and no murmurs and no lower extremity edema ABDOMEN:abdomen soft, non-tender and normal bowel sounds. No palpable splenomegaly  Musculoskeletal:no cyanosis of digits and no clubbing  NEURO: alert & oriented x 3 with fluent speech, no focal motor/sensory deficits  LABORATORY DATA:  I have reviewed the data as listed    Component Value Date/Time   NA 141 10/22/2012 1212   NA 140 06/25/2012 1651   K 4.2 10/22/2012 1212   K 4.4 06/25/2012 1651   CL 106 06/25/2012 1651   CO2 24 10/22/2012 1212   CO2 27 06/25/2012 1651   GLUCOSE 97 10/22/2012 1212   GLUCOSE 112* 06/25/2012 1651   BUN 13.6 10/22/2012 1212   BUN 15 09/10/2012 1638   CREATININE 0.9 10/22/2012 1212   CREATININE 0.9 09/10/2012 1638   CREATININE 0.86 05/12/2006 1511   CALCIUM 9.2 10/22/2012 1212   CALCIUM 9.2 06/25/2012 1651   PROT 7.4 10/22/2012 1212   PROT 7.4 06/25/2012 1651   ALBUMIN 3.7 10/22/2012 1212   ALBUMIN 3.7 06/25/2012 1651   AST 16 10/22/2012 1212   AST 13 06/25/2012 1651   ALT 12 10/22/2012 1212   ALT 11 06/25/2012 1651   ALKPHOS 73 10/22/2012 1212   ALKPHOS 83 06/25/2012 1651   BILITOT 0.69 10/22/2012 1212   BILITOT 0.4 06/25/2012 1651   GFRNONAA 83* 06/25/2012 1651   GFRAA >90 06/25/2012 1651    No results found for this basename: SPEP, UPEP,  kappa and lambda light chains    Lab Results  Component Value Date   WBC 6.8 10/22/2012   NEUTROABS 3.1 10/22/2012   HGB 12.8 10/22/2012   HCT 38.0 10/22/2012   MCV 97.6 10/22/2012   PLT 156 10/22/2012      Chemistry      Component Value Date/Time   NA 141 10/22/2012 1212   NA 140 06/25/2012 1651   K 4.2 10/22/2012 1212   K 4.4 06/25/2012 1651   CL  106 06/25/2012 1651   CO2 24 10/22/2012 1212   CO2 27 06/25/2012 1651   BUN 13.6 10/22/2012 1212   BUN 15 09/10/2012 1638   CREATININE 0.9 10/22/2012 1212   CREATININE 0.9 09/10/2012 1638   CREATININE 0.86 05/12/2006 1511      Component Value Date/Time   CALCIUM 9.2 10/22/2012 1212   CALCIUM 9.2 06/25/2012 1651   ALKPHOS 73 10/22/2012 1212   ALKPHOS 83 06/25/2012 1651   AST 16  10/22/2012 1212   AST 13 06/25/2012 1651   ALT 12 10/22/2012 1212   ALT 11 06/25/2012 1651   BILITOT 0.69 10/22/2012 1212   BILITOT 0.4 06/25/2012 1651     ASSESSMENT:  #1 MGUS, no evidence of progression #2 history of B12 deficiency, resolved  PLAN:  #1 MGUS Should no evidence of disease progression. We'll continue history, physical examination, and bloodwork in 9 months. #2 history of B12 deficiency B12 level is adequate. I recommend over-the-counter oral B12 supplement #3 depression Overall, the patient appeared depressed. I encouraged her that her blood work is stable. #4 history of iron deficiency anemia I recommend the patient to stop taking iron supplement. The ferritin level is over 50 #5 nicotine dependency I gave the patient suggestion of how to quit smoking. The patient appeared motivated. #6 preventive care I recommend influenza vaccination but the patient declined. Orders Placed This Encounter  Procedures  . CBC with Differential    Standing Status: Future     Number of Occurrences:      Standing Expiration Date: 08/13/2013  . Comprehensive metabolic panel    Standing Status: Future     Number of Occurrences:      Standing Expiration Date: 11/21/2013  . Lactate dehydrogenase    Standing Status: Future     Number of Occurrences:      Standing Expiration Date: 11/21/2013  . Ferritin    Standing Status: Future     Number of Occurrences:      Standing Expiration Date: 11/21/2013  . Vitamin B12    Standing Status: Future     Number of Occurrences:      Standing Expiration Date: 11/21/2013  . SPEP &  IFE with QIG    Standing Status: Future     Number of Occurrences:      Standing Expiration Date: 11/21/2013  . Kappa/lambda light chains    Standing Status: Future     Number of Occurrences:      Standing Expiration Date: 11/21/2013  . Beta 2 microglobuline, serum    Standing Status: Future     Number of Occurrences:      Standing Expiration Date: 11/21/2013  . TSH    Standing Status: Future     Number of Occurrences:      Standing Expiration Date: 11/21/2013  . T4, free    Standing Status: Future     Number of Occurrences:      Standing Expiration Date: 11/21/2013   All questions were answered. The patient knows to call the clinic with any problems, questions or concerns. No barriers to learning was detected. I spent 25 minutes counseling the patient face to face. The total time spent in the appointment was 40 minutes and more than 50% was on counseling and review of test results     [facility], Sylvio Weatherall, MD 11/21/2012 2:43 PM

## 2012-11-22 ENCOUNTER — Telehealth: Payer: Self-pay | Admitting: Hematology and Oncology

## 2012-11-22 NOTE — Telephone Encounter (Signed)
s.w. pt husband and he stated she was sleep adn ok to mail pt appt....mail pt appt sched and avs with letter

## 2012-11-23 ENCOUNTER — Other Ambulatory Visit: Payer: Self-pay | Admitting: Internal Medicine

## 2013-01-21 ENCOUNTER — Other Ambulatory Visit: Payer: Self-pay | Admitting: Internal Medicine

## 2013-01-24 ENCOUNTER — Other Ambulatory Visit: Payer: Self-pay | Admitting: Internal Medicine

## 2013-01-29 ENCOUNTER — Telehealth: Payer: Self-pay | Admitting: Internal Medicine

## 2013-01-29 NOTE — Telephone Encounter (Signed)
Rec'd from [facility] forward 5 pages to Dr. Brodie °

## 2013-02-25 ENCOUNTER — Telehealth: Payer: Self-pay | Admitting: Internal Medicine

## 2013-02-25 ENCOUNTER — Other Ambulatory Visit: Payer: Self-pay | Admitting: Internal Medicine

## 2013-02-26 NOTE — Telephone Encounter (Signed)
Rx sent to pharmacy   

## 2013-04-11 ENCOUNTER — Telehealth: Payer: Self-pay | Admitting: Internal Medicine

## 2013-04-11 NOTE — Telephone Encounter (Signed)
Rec'd from [facility] forward  2 pages to Dr.Brodie °

## 2013-05-02 ENCOUNTER — Other Ambulatory Visit: Payer: Self-pay | Admitting: Internal Medicine

## 2013-05-27 ENCOUNTER — Ambulatory Visit: Payer: Self-pay | Admitting: Podiatry

## 2013-06-28 ENCOUNTER — Other Ambulatory Visit: Payer: Self-pay | Admitting: Internal Medicine

## 2013-08-07 ENCOUNTER — Other Ambulatory Visit: Payer: Self-pay | Admitting: Internal Medicine

## 2013-08-09 ENCOUNTER — Telehealth: Payer: Self-pay | Admitting: Internal Medicine

## 2013-08-09 MED ORDER — LORAZEPAM 2 MG PO TABS: ORAL_TABLET | ORAL | Status: DC

## 2013-08-09 NOTE — Telephone Encounter (Signed)
I have spoken to patient and have advised that we have sent refill to her pharmacy until her office visit. She verbalizes understanding.

## 2013-08-14 ENCOUNTER — Other Ambulatory Visit (HOSPITAL_BASED_OUTPATIENT_CLINIC_OR_DEPARTMENT_OTHER): Payer: Commercial Managed Care - HMO

## 2013-08-14 DIAGNOSIS — R5383 Other fatigue: Secondary | ICD-10-CM

## 2013-08-14 DIAGNOSIS — F329 Major depressive disorder, single episode, unspecified: Secondary | ICD-10-CM

## 2013-08-14 DIAGNOSIS — D472 Monoclonal gammopathy: Secondary | ICD-10-CM

## 2013-08-14 DIAGNOSIS — F3289 Other specified depressive episodes: Secondary | ICD-10-CM

## 2013-08-14 DIAGNOSIS — E538 Deficiency of other specified B group vitamins: Secondary | ICD-10-CM

## 2013-08-14 DIAGNOSIS — R5381 Other malaise: Secondary | ICD-10-CM

## 2013-08-14 LAB — COMPREHENSIVE METABOLIC PANEL (CC13)
ALT: 16 U/L (ref 0–55)
AST: 16 U/L (ref 5–34)
Albumin: 3.6 g/dL (ref 3.5–5.0)
Alkaline Phosphatase: 85 U/L (ref 40–150)
Anion Gap: 7 mEq/L (ref 3–11)
BUN: 15.5 mg/dL (ref 7.0–26.0)
CO2: 23 mEq/L (ref 22–29)
CREATININE: 0.8 mg/dL (ref 0.6–1.1)
Calcium: 8.8 mg/dL (ref 8.4–10.4)
Chloride: 111 mEq/L — ABNORMAL HIGH (ref 98–109)
GLUCOSE: 97 mg/dL (ref 70–140)
Potassium: 4 mEq/L (ref 3.5–5.1)
Sodium: 142 mEq/L (ref 136–145)
Total Bilirubin: 0.41 mg/dL (ref 0.20–1.20)
Total Protein: 7 g/dL (ref 6.4–8.3)

## 2013-08-14 LAB — LACTATE DEHYDROGENASE (CC13): LDH: 194 U/L (ref 125–245)

## 2013-08-14 LAB — TSH CHCC: TSH: 0.776 m[IU]/L (ref 0.308–3.960)

## 2013-08-14 LAB — FERRITIN CHCC: FERRITIN: 32 ng/mL (ref 9–269)

## 2013-08-16 ENCOUNTER — Encounter: Payer: Self-pay | Admitting: *Deleted

## 2013-08-16 LAB — SPEP & IFE WITH QIG
ALBUMIN ELP: 54.1 % — AB (ref 55.8–66.1)
ALPHA-2-GLOBULIN: 9.3 % (ref 7.1–11.8)
Alpha-1-Globulin: 7.3 % — ABNORMAL HIGH (ref 2.9–4.9)
BETA 2: 4.6 % (ref 3.2–6.5)
BETA GLOBULIN: 6.8 % (ref 4.7–7.2)
GAMMA GLOBULIN: 17.9 % (ref 11.1–18.8)
IGM, SERUM: 191 mg/dL (ref 52–322)
IgA: 259 mg/dL (ref 69–380)
IgG (Immunoglobin G), Serum: 1170 mg/dL (ref 690–1700)
Total Protein, Serum Electrophoresis: 6.8 g/dL (ref 6.0–8.3)

## 2013-08-16 LAB — KAPPA/LAMBDA LIGHT CHAINS
KAPPA LAMBDA RATIO: 1.27 (ref 0.26–1.65)
Kappa free light chain: 2.66 mg/dL — ABNORMAL HIGH (ref 0.33–1.94)
Lambda Free Lght Chn: 2.1 mg/dL (ref 0.57–2.63)

## 2013-08-16 LAB — BETA 2 MICROGLOBULIN, SERUM: BETA 2 MICROGLOBULIN: 2.42 mg/L (ref <=2.51)

## 2013-08-16 LAB — T4, FREE: FREE T4: 0.89 ng/dL (ref 0.80–1.80)

## 2013-08-16 LAB — VITAMIN B12: VITAMIN B 12: 353 pg/mL (ref 211–911)

## 2013-08-21 ENCOUNTER — Telehealth: Payer: Self-pay | Admitting: *Deleted

## 2013-08-21 ENCOUNTER — Encounter: Payer: Self-pay | Admitting: Hematology and Oncology

## 2013-08-21 ENCOUNTER — Telehealth: Payer: Self-pay | Admitting: Hematology and Oncology

## 2013-08-21 ENCOUNTER — Ambulatory Visit (HOSPITAL_BASED_OUTPATIENT_CLINIC_OR_DEPARTMENT_OTHER): Payer: Commercial Managed Care - HMO | Admitting: Hematology and Oncology

## 2013-08-21 ENCOUNTER — Encounter (HOSPITAL_COMMUNITY): Payer: Self-pay

## 2013-08-21 VITALS — BP 160/73 | HR 101 | Temp 98.3°F | Resp 20 | Ht 62.0 in | Wt 134.0 lb

## 2013-08-21 DIAGNOSIS — D472 Monoclonal gammopathy: Secondary | ICD-10-CM

## 2013-08-21 DIAGNOSIS — F329 Major depressive disorder, single episode, unspecified: Secondary | ICD-10-CM

## 2013-08-21 DIAGNOSIS — F3289 Other specified depressive episodes: Secondary | ICD-10-CM

## 2013-08-21 DIAGNOSIS — E538 Deficiency of other specified B group vitamins: Secondary | ICD-10-CM

## 2013-08-21 DIAGNOSIS — R591 Generalized enlarged lymph nodes: Secondary | ICD-10-CM

## 2013-08-21 DIAGNOSIS — R599 Enlarged lymph nodes, unspecified: Secondary | ICD-10-CM

## 2013-08-21 NOTE — Telephone Encounter (Signed)
gv adn printed appt scehd adn avs for pt for Aug

## 2013-08-21 NOTE — Assessment & Plan Note (Signed)
I suspect she may have low-grade lymphoma. MGUS is stable and it could be related to lymphoma. We'll proceed with PET scan and bone marrow biopsy.

## 2013-08-21 NOTE — Assessment & Plan Note (Signed)
She has major depression but denies suicidal ideation. I recommend psychiatrist evaluation but the patient declined.

## 2013-08-21 NOTE — Assessment & Plan Note (Signed)
Her B12 level is within normal limits. She will continue taking oral supplement.

## 2013-08-21 NOTE — Telephone Encounter (Signed)
[facility]Bx scheduled for 09/12/13 at Short Stay per Dr. Alvy Bimler.  Attempting to reach pt to give her instructions.  Will try again tomorrow.

## 2013-08-21 NOTE — Assessment & Plan Note (Signed)
I suspect she may have lymphoma. I discussed important of PET/CT scan and bone marrow biopsy. After a very prolonged discussion, she was in agreement to proceed with the plan.

## 2013-08-21 NOTE — Progress Notes (Signed)
[name] OFFICE PROGRESS NOTE  Patient Care Team: Birdie Riddle, MD as PCP - General (Internal Medicine)  SUMMARY OF ONCOLOGIC HISTORY: This patient have diagnosis of MGUS and B12 deficiency. In August 2014, she was slow to have abdominal lymphadenopathy, confirmed on PET scan. Due to significant depression, I recommend observation rather than proceed with biopsy.  INTERVAL HISTORY: Please see below for problem oriented charting. The patient appears depressed. She does not want to talk about anything. She denies suicidal ideation. On further questioning, she complained of discomfort in her abdomen on and off. She denies nausea or vomiting. She had mild constipation. She had anorexia with 5 pound weight loss over the last 9 months.  REVIEW OF SYSTEMS:   Constitutional: Denies fevers, chills  Eyes: Denies blurriness of vision Ears, nose, mouth, throat, and face: Denies mucositis or sore throat Respiratory: Denies cough, dyspnea or wheezes Cardiovascular: Denies palpitation, chest discomfort or lower extremity swelling Skin: Denies abnormal skin rashes Lymphatics: Denies new lymphadenopathy or easy bruising Neurological:Denies numbness, tingling or new weaknesses Behavioral/Psych: Mood is stable, no new changes  All other systems were reviewed with the patient and are negative.  I have reviewed the past medical history, past surgical history, social history and family history with the patient and they are unchanged from previous note.  ALLERGIES:  has No Known Allergies.  MEDICATIONS:  Current Outpatient Prescriptions  Medication Sig Dispense Refill  . aspirin 81 MG tablet Take 81 mg by mouth daily.        [name] atorvastatin (LIPITOR) 20 MG tablet Take 20 mg by mouth daily.      . clopidogrel (PLAVIX) 75 MG tablet Take 75 mg by mouth daily.      . famotidine (PEPCID) 20 MG tablet Take 1 tablet (20 mg total) by mouth 2 (two) times daily.  60 tablet  1  .  HYDROcodone-acetaminophen (NORCO) 10-325 MG per tablet Take 1-2 tablets by mouth every 4 (four) hours as needed for pain.       [name] LORazepam (ATIVAN) 2 MG tablet TAKE 1 TABLET BY MOUTH THREE TIMES DAILY  90 tablet  0  . losartan (COZAAR) 100 MG tablet Take 100 mg by mouth daily.      . megestrol (MEGACE) 40 MG/ML suspension Take 200 mg by mouth 2 (two) times a week. No specific days of the week      . naproxen (NAPROSYN) 500 MG tablet Take 1 tablet (500 mg total) by mouth 2 (two) times daily with a meal.  30 tablet  0  . nitroGLYCERIN (NITROLINGUAL) 0.4 MG/SPRAY spray Place 1 spray under the tongue every 5 (five) minutes x 3 doses as needed for chest pain.      [name] oxybutynin (DITROPAN-XL) 5 MG 24 hr tablet Take 5 mg by mouth daily as needed (Urinary Incontinence).        No current facility-administered medications for this visit.    PHYSICAL EXAMINATION: ECOG PERFORMANCE STATUS: 1 - Symptomatic but completely ambulatory  Filed Vitals:   08/21/13 1448  BP: 160/73  Pulse: 101  Temp: 98.3 F (36.8 C)  Resp: 20   Filed Weights   08/21/13 1448  Weight: 134 lb (60.782 kg)    GENERAL:alert, no distress and comfortable SKIN: skin color, texture, turgor are normal, no rashes or significant lesions EYES: normal, Conjunctiva are pink and non-injected, sclera clear OROPHARYNX:no exudate, no erythema and lips, buccal mucosa, and tongue normal  NECK: supple, thyroid normal size, non-tender, without nodularity  LYMPH:  no palpable lymphadenopathy in the cervical, axillary or inguinal LUNGS: clear to auscultation and percussion with normal breathing effort HEART: regular rate & rhythm and no murmurs and no lower extremity edema ABDOMEN:abdomen soft, non-tender and normal bowel sounds. No palpable splenomegaly Musculoskeletal:no cyanosis of digits and no clubbing  NEURO: alert & oriented x 3 with fluent speech, no focal motor/sensory deficits  LABORATORY DATA:  I have reviewed the data as  listed    Component Value Date/Time   NA 142 08/14/2013 1401   NA 140 06/25/2012 1651   K 4.0 08/14/2013 1401   K 4.4 06/25/2012 1651   CL 106 06/25/2012 1651   CO2 23 08/14/2013 1401   CO2 27 06/25/2012 1651   GLUCOSE 97 08/14/2013 1401   GLUCOSE 112* 06/25/2012 1651   BUN 15.5 08/14/2013 1401   BUN 15 09/10/2012 1638   CREATININE 0.8 08/14/2013 1401   CREATININE 0.9 09/10/2012 1638   CREATININE 0.86 05/12/2006 1511   CALCIUM 8.8 08/14/2013 1401   CALCIUM 9.2 06/25/2012 1651   PROT 7.0 08/14/2013 1401   PROT 7.4 06/25/2012 1651   ALBUMIN 3.6 08/14/2013 1401   ALBUMIN 3.7 06/25/2012 1651   AST 16 08/14/2013 1401   AST 13 06/25/2012 1651   ALT 16 08/14/2013 1401   ALT 11 06/25/2012 1651   ALKPHOS 85 08/14/2013 1401   ALKPHOS 83 06/25/2012 1651   BILITOT 0.41 08/14/2013 1401   BILITOT 0.4 06/25/2012 1651   GFRNONAA 83* 06/25/2012 1651   GFRAA >90 06/25/2012 1651    No results found for this basename: SPEP, UPEP,  kappa and lambda light chains    Lab Results  Component Value Date   WBC 6.8 10/22/2012   NEUTROABS 3.1 10/22/2012   HGB 12.8 10/22/2012   HCT 38.0 10/22/2012   MCV 97.6 10/22/2012   PLT 156 10/22/2012      Chemistry      Component Value Date/Time   NA 142 08/14/2013 1401   NA 140 06/25/2012 1651   K 4.0 08/14/2013 1401   K 4.4 06/25/2012 1651   CL 106 06/25/2012 1651   CO2 23 08/14/2013 1401   CO2 27 06/25/2012 1651   BUN 15.5 08/14/2013 1401   BUN 15 09/10/2012 1638   CREATININE 0.8 08/14/2013 1401   CREATININE 0.9 09/10/2012 1638   CREATININE 0.86 05/12/2006 1511      Component Value Date/Time   CALCIUM 8.8 08/14/2013 1401   CALCIUM 9.2 06/25/2012 1651   ALKPHOS 85 08/14/2013 1401   ALKPHOS 83 06/25/2012 1651   AST 16 08/14/2013 1401   AST 13 06/25/2012 1651   ALT 16 08/14/2013 1401   ALT 11 06/25/2012 1651   BILITOT 0.41 08/14/2013 1401   BILITOT 0.4 06/25/2012 1651       RADIOGRAPHIC STUDIES: I reviewed imaging study with her, her daughter and granddaughter. I have personally reviewed the radiological  images as listed and agreed with the findings in the report.  ASSESSMENT & PLAN:  Lymphadenopathy I suspect she may have lymphoma. I discussed important of PET/CT scan and bone marrow biopsy. After a very prolonged discussion, she was in agreement to proceed with the plan.  MONOCLONAL GAMMOPATHY I suspect she may have low-grade lymphoma. MGUS is stable and it could be related to lymphoma. We'll proceed with PET scan and bone marrow biopsy.  B12 DEFICIENCY Her B12 level is within normal limits. She will continue taking oral supplement.  DEPRESSIVE DISORDER, NOS She has major depression but denies  suicidal ideation. I recommend psychiatrist evaluation but the patient declined.   Orders Placed This Encounter  Procedures  . NM PET Image Restag (PS) Skull Base To Thigh    Standing Status: Future     Number of Occurrences:      Standing Expiration Date: 10/21/2014    Order Specific Question:  Reason for Exam (SYMPTOM  OR DIAGNOSIS REQUIRED)    Answer:  staging lymphoma    Order Specific Question:  Preferred imaging location?    Answer:  [facility]   All questions were answered. The patient knows to call the clinic with any problems, questions or concerns. No barriers to learning was detected. I spent 30 minutes counseling the patient face to face. The total time spent in the appointment was 40 minutes and more than 50% was on counseling and review of test results     [facility], [place], MD 08/21/2013 5:40 PM

## 2013-08-22 NOTE — Telephone Encounter (Signed)
Informed pt of BMBx on 09/12/13.  Arrive to [facility] at 7 am,  NPO after Midnight and need driver home.  Pt wrote down instructions and read them back to this nurse.  Notified Butch Penny in [place] earlier today of Bx.

## 2013-09-03 ENCOUNTER — Ambulatory Visit (HOSPITAL_COMMUNITY)
Admission: RE | Admit: 2013-09-03 | Discharge: 2013-09-03 | Disposition: A | Discharge status: Home / Self Care | Payer: Medicare HMO | Source: Ambulatory Visit | Attending: Diagnostic Radiology | Admitting: Diagnostic Radiology

## 2013-09-03 DIAGNOSIS — R591 Generalized enlarged lymph nodes: Secondary | ICD-10-CM

## 2013-09-03 DIAGNOSIS — D472 Monoclonal gammopathy: Secondary | ICD-10-CM

## 2013-09-03 DIAGNOSIS — C8583 Other specified types of non-Hodgkin lymphoma, intra-abdominal lymph nodes: Secondary | ICD-10-CM | POA: Insufficient documentation

## 2013-09-03 DIAGNOSIS — R599 Enlarged lymph nodes, unspecified: Secondary | ICD-10-CM | POA: Diagnosis not present

## 2013-09-03 LAB — GLUCOSE, CAPILLARY: Glucose-Capillary: 85 mg/dL (ref 70–99)

## 2013-09-03 MED ORDER — FLUDEOXYGLUCOSE F - 18 (FDG) INJECTION
6.8000 | Freq: Once | INTRAVENOUS | Status: AC | PRN
  Administered 2013-09-03: 6.8 via INTRAVENOUS

## 2013-09-10 ENCOUNTER — Other Ambulatory Visit: Payer: Self-pay | Admitting: Hematology and Oncology

## 2013-09-10 DIAGNOSIS — R591 Generalized enlarged lymph nodes: Secondary | ICD-10-CM

## 2013-09-12 ENCOUNTER — Ambulatory Visit (HOSPITAL_COMMUNITY): Admission: RE | Admit: 2013-09-12 | Payer: Medicare HMO | Source: Ambulatory Visit

## 2013-09-12 ENCOUNTER — Other Ambulatory Visit: Payer: Self-pay | Admitting: Internal Medicine

## 2013-09-20 ENCOUNTER — Ambulatory Visit (HOSPITAL_BASED_OUTPATIENT_CLINIC_OR_DEPARTMENT_OTHER): Payer: Commercial Managed Care - HMO | Admitting: Hematology and Oncology

## 2013-09-20 ENCOUNTER — Telehealth: Payer: Self-pay | Admitting: *Deleted

## 2013-09-20 VITALS — BP 146/78 | HR 111 | Temp 98.8°F | Resp 18 | Ht 62.0 in | Wt 136.6 lb

## 2013-09-20 DIAGNOSIS — R591 Generalized enlarged lymph nodes: Secondary | ICD-10-CM

## 2013-09-20 DIAGNOSIS — R599 Enlarged lymph nodes, unspecified: Secondary | ICD-10-CM

## 2013-09-20 DIAGNOSIS — F17200 Nicotine dependence, unspecified, uncomplicated: Secondary | ICD-10-CM

## 2013-09-20 NOTE — Telephone Encounter (Signed)
Informed pt's daughter, Nevin Bloodgood, of BMBx scheduled for Friday 9/04 at [facility]  Pt needs to arrive at 7 am and nothing to eat or drink after midnight.  Written instructions given.  She verbalized understanding.  Butch Penny in [employer] notified.

## 2013-09-22 ENCOUNTER — Encounter: Payer: Self-pay | Admitting: Hematology and Oncology

## 2013-09-22 NOTE — Progress Notes (Signed)
[place] OFFICE PROGRESS NOTE  Patient Care Team: Birdie Riddle, MD as PCP - General (Internal Medicine)  SUMMARY OF ONCOLOGIC HISTORY: This patient have diagnosis of MGUS and B12 deficiency. In August 2014, she was slow to have abdominal lymphadenopathy, confirmed on PET scan. Due to significant depression, I recommend observation rather than proceed with biopsy. On 09/03/2013, PET CT scan was repeated due to progressive anorexia and weight loss. INTERVAL HISTORY: Please see below for problem oriented charting. She did not show up for bone marrow biopsy recently. When I questioned the patient, she over the eye contact. She stated that she forgot about the procedure. REVIEW OF SYSTEMS:  Unable to obtain as the patient does not engage in much conversation All other systems were reviewed with the patient and are negative.  I have reviewed the past medical history, past surgical history, social history and family history with the patient and they are unchanged from previous note.  ALLERGIES:  has No Known Allergies.  MEDICATIONS:  Current Outpatient Prescriptions  Medication Sig Dispense Refill  . aspirin 81 MG tablet Take 81 mg by mouth daily.        [name] atorvastatin (LIPITOR) 20 MG tablet Take 20 mg by mouth daily.      . clopidogrel (PLAVIX) 75 MG tablet Take 75 mg by mouth daily.      . famotidine (PEPCID) 20 MG tablet Take 1 tablet (20 mg total) by mouth 2 (two) times daily.  60 tablet  1  . HYDROcodone-acetaminophen (NORCO) 10-325 MG per tablet Take 1-2 tablets by mouth every 4 (four) hours as needed for pain.       [name] LORazepam (ATIVAN) 2 MG tablet TAKE 1 TABLET BY MOUTH THREE TIMES DAILY  90 tablet  0  . losartan (COZAAR) 100 MG tablet Take 100 mg by mouth daily.      . megestrol (MEGACE) 40 MG/ML suspension Take 200 mg by mouth 2 (two) times a week. No specific days of the week      . nitroGLYCERIN (NITROLINGUAL) 0.4 MG/SPRAY spray Place 1 spray under the tongue  every 5 (five) minutes x 3 doses as needed for chest pain.      [name] oxybutynin (DITROPAN-XL) 5 MG 24 hr tablet Take 5 mg by mouth daily as needed (Urinary Incontinence).        No current facility-administered medications for this visit.    PHYSICAL EXAMINATION: ECOG PERFORMANCE STATUS: 1 - Symptomatic but completely ambulatory  Filed Vitals:   09/20/13 1506  BP: 146/78  Pulse: 111  Temp: 98.8 F (37.1 C)  Resp: 18   Filed Weights   09/20/13 1506  Weight: 136 lb 9.6 oz (61.961 kg)    GENERAL:alert, no distress and comfortable ears she looks thin and mildly cachectic SKIN: skin color, texture, turgor are normal, no rashes or significant lesions EYES: normal, Conjunctiva are pink and non-injected, sclera clear Musculoskeletal:no cyanosis of digits and no clubbing  NEURO: alert & oriented x 3 with fluent speech, no focal motor/sensory deficits  LABORATORY DATA:  I have reviewed the data as listed    Component Value Date/Time   NA 142 08/14/2013 1401   NA 140 06/25/2012 1651   K 4.0 08/14/2013 1401   K 4.4 06/25/2012 1651   CL 106 06/25/2012 1651   CO2 23 08/14/2013 1401   CO2 27 06/25/2012 1651   GLUCOSE 97 08/14/2013 1401   GLUCOSE 112* 06/25/2012 1651   BUN 15.5 08/14/2013 1401  BUN 15 09/10/2012 1638   CREATININE 0.8 08/14/2013 1401   CREATININE 0.9 09/10/2012 1638   CREATININE 0.86 05/12/2006 1511   CALCIUM 8.8 08/14/2013 1401   CALCIUM 9.2 06/25/2012 1651   PROT 7.0 08/14/2013 1401   PROT 7.4 06/25/2012 1651   ALBUMIN 3.6 08/14/2013 1401   ALBUMIN 3.7 06/25/2012 1651   AST 16 08/14/2013 1401   AST 13 06/25/2012 1651   ALT 16 08/14/2013 1401   ALT 11 06/25/2012 1651   ALKPHOS 85 08/14/2013 1401   ALKPHOS 83 06/25/2012 1651   BILITOT 0.41 08/14/2013 1401   BILITOT 0.4 06/25/2012 1651   GFRNONAA 83* 06/25/2012 1651   GFRAA >90 06/25/2012 1651    No results found for this basename: SPEP,  UPEP,   kappa and lambda light chains    Lab Results  Component Value Date   WBC 6.8 10/22/2012   NEUTROABS  3.1 10/22/2012   HGB 12.8 10/22/2012   HCT 38.0 10/22/2012   MCV 97.6 10/22/2012   PLT 156 10/22/2012      Chemistry      Component Value Date/Time   NA 142 08/14/2013 1401   NA 140 06/25/2012 1651   K 4.0 08/14/2013 1401   K 4.4 06/25/2012 1651   CL 106 06/25/2012 1651   CO2 23 08/14/2013 1401   CO2 27 06/25/2012 1651   BUN 15.5 08/14/2013 1401   BUN 15 09/10/2012 1638   CREATININE 0.8 08/14/2013 1401   CREATININE 0.9 09/10/2012 1638   CREATININE 0.86 05/12/2006 1511      Component Value Date/Time   CALCIUM 8.8 08/14/2013 1401   CALCIUM 9.2 06/25/2012 1651   ALKPHOS 85 08/14/2013 1401   ALKPHOS 83 06/25/2012 1651   AST 16 08/14/2013 1401   AST 13 06/25/2012 1651   ALT 16 08/14/2013 1401   ALT 11 06/25/2012 1651   BILITOT 0.41 08/14/2013 1401   BILITOT 0.4 06/25/2012 1651       RADIOGRAPHIC STUDIES: Reviewed the PET/CT scan with her daughter and the patient I have personally reviewed the radiological images as listed and agreed with the findings in the report.  ASSESSMENT & PLAN:  Lymphadenopathy I suspect she may have lymphoma. I have reviewed the results of PET/CT scan and shared the images with the patient and her daughter. She was a no-show when we previously scheduled bone marrow biopsy. After a very prolonged discussion, she was in agreement to proceed with the plan for bone marrow biopsy. Without tissue biopsy, I am not able to treat her.    TOBACCO DEPENDENCE I spent some time counseling the patient the importance of tobacco cessation. She is currently not interested to quit now.     No orders of the defined types were placed in this encounter.   All questions were answered. The patient knows to call the clinic with any problems, questions or concerns. No barriers to learning was detected. I spent 25 minutes counseling the patient face to face. The total time spent in the appointment was 30 minutes and more than 50% was on counseling and review of test results     [facility], [place],  MD 09/22/2013 9:06 PM

## 2013-09-22 NOTE — Assessment & Plan Note (Signed)
I spent some time counseling the patient the importance of tobacco cessation. She is currently not interested to quit now. 

## 2013-09-22 NOTE — Assessment & Plan Note (Signed)
I suspect she may have lymphoma. I have reviewed the results of PET/CT scan and shared the images with the patient and her daughter. She was a no-show when we previously scheduled bone marrow biopsy. After a very prolonged discussion, she was in agreement to proceed with the plan for bone marrow biopsy. Without tissue biopsy, I am not able to treat her.

## 2013-09-24 ENCOUNTER — Encounter: Payer: Self-pay | Admitting: Internal Medicine

## 2013-09-24 ENCOUNTER — Ambulatory Visit (INDEPENDENT_AMBULATORY_CARE_PROVIDER_SITE_OTHER): Payer: Commercial Managed Care - HMO | Admitting: Internal Medicine

## 2013-09-24 VITALS — BP 112/60 | HR 80 | Ht 62.0 in | Wt 136.4 lb

## 2013-09-24 DIAGNOSIS — R195 Other fecal abnormalities: Secondary | ICD-10-CM

## 2013-09-24 DIAGNOSIS — R933 Abnormal findings on diagnostic imaging of other parts of digestive tract: Secondary | ICD-10-CM

## 2013-09-24 DIAGNOSIS — D689 Coagulation defect, unspecified: Secondary | ICD-10-CM

## 2013-09-24 MED ORDER — LORAZEPAM 2 MG PO TABS: ORAL_TABLET | ORAL | Status: DC

## 2013-09-24 MED ORDER — POLYETHYLENE GLYCOL 3350 17 GM/SCOOP PO POWD: ORAL | Status: DC

## 2013-09-24 NOTE — Patient Instructions (Addendum)
We have sent the following medications to your pharmacy for you to pick up at your convenience: Lorazepam Miralax  Dr Alvy Bimler, Dr Doylene Canard

## 2013-09-24 NOTE — Progress Notes (Signed)
[name] December 07, 1935 [id number]  Note: This dictation was prepared with Dragon digital system. Any transcriptional errors that result from this procedure are unintentional.   History of Present Illness: This is a 78 year old African American female comes for refill of medication, specifically Ativan 2 mg 3 times a day. She has a history of terminal ileal resection and right hemicolectomy after incidental colon perforation during gynecological procedure in 1982 . She has a history of monoclonal kappa gammopathy  scheduled for  bone marrow biopsy by Dr. Alvy Bimler later this week to rule out lymphoma. She has osteoarthritis, anxiety and depression. I have been seeing her for over 20 years mostly for diarrhea which has recently turned into constipation. She used to take. cholestyramine 4 g daily but she is now taking laxatives to have a bowel movement. Last colonoscopy in March 2008 showed patent ileocolic anastomosis. She has been on Plavix for coronary artery disease followed by Dr. Doylene Canard.. She continues to smoke    Past Medical History  Diagnosis Date  . Duodenitis   . Gastritis   . GERD (gastroesophageal reflux disease)   . Chronic diarrhea   . IBS (irritable bowel syndrome)   . Internal hemorrhoids   . Monoclonal gammopathy   . Urge incontinence   . Benign hypertension   . DJD (degenerative joint disease)   . Depressive disorder   . Anxiety   . Hyperlipidemia   . Vitamin B12 deficiency   . Constipation   . CAD (coronary artery disease)   . Anemia   . Lymphadenopathy 10/22/2012    Past Surgical History  Procedure Laterality Date  . Abdominal hysterectomy  1992    with ovarian cystectomy  . Hemicolectomy    . Ileocolonic anasomosis    . Spine surgery    . Coronary stent placement      No Known Allergies  Family history and social history have been reviewed.  Review of Systems: Recent weight has been stable. Negative for dysphagia  The remainder of the 10 point ROS  is negative except as outlined in the H&P  Physical Exam: General Appearance Well developed, in no distress, appears depressed Eyes  Non icteric  HEENT  Non traumatic, normocephalic  Mouth No lesion, tongue papillated, no cheilosis, Neck Supple without adenopathy, thyroid not enlarged, no carotid bruits, no JVD Lungs Clear to auscultation bilaterally COR Normal S1, normal S2, regular rhythm, no murmur, quiet precordium Abdomen postsurgical scars are well healed. Normal active bowel sounds. Mild tenderness right lower quadrant. No palpable mass Rectal soft trace Hemoccult-positive stool Extremities  No pedal edema Skin No lesions Neurological Alert and oriented x 3 Psychological Normal mood and affect  Assessment and Plan:   78 year old African American female with irritable bowel syndrome / post operative diarrhea since  right hemicolectomy for benign disease in 1982. She now has functional constipation and we will start her on MiraLax 17 g daily. She may increase the dose to twice a day as needed.    Anxiety and depression. Renew Ativan 2 mg 3 times a day  Monoclonal myopathy and mesenteric adenopathy worrisome for lymphoma. Followed by Dr. Alvy Bimler , bone marrow biopsy pending.    Delfin Edis 09/24/2013

## 2013-09-25 ENCOUNTER — Encounter (HOSPITAL_COMMUNITY): Payer: Self-pay | Admitting: Pharmacy Technician

## 2013-09-27 ENCOUNTER — Ambulatory Visit (HOSPITAL_COMMUNITY)
Admission: RE | Admit: 2013-09-27 | Discharge: 2013-09-27 | Disposition: A | Discharge status: Home / Self Care | Payer: Medicare HMO | Source: Ambulatory Visit | Attending: Hematology and Oncology | Admitting: Hematology and Oncology

## 2013-09-27 ENCOUNTER — Telehealth: Payer: Self-pay | Admitting: Hematology and Oncology

## 2013-09-27 ENCOUNTER — Encounter (HOSPITAL_COMMUNITY): Payer: Self-pay

## 2013-09-27 VITALS — BP 111/55 | HR 79 | Temp 98.5°F | Resp 16 | Ht 62.0 in | Wt 136.4 lb

## 2013-09-27 DIAGNOSIS — D704 Cyclic neutropenia: Secondary | ICD-10-CM | POA: Diagnosis not present

## 2013-09-27 DIAGNOSIS — R591 Generalized enlarged lymph nodes: Secondary | ICD-10-CM

## 2013-09-27 DIAGNOSIS — R599 Enlarged lymph nodes, unspecified: Secondary | ICD-10-CM

## 2013-09-27 LAB — CBC WITH DIFFERENTIAL/PLATELET
BASOS ABS: 0 10*3/uL (ref 0.0–0.1)
BASOS PCT: 0 % (ref 0–1)
EOS ABS: 0.2 10*3/uL (ref 0.0–0.7)
Eosinophils Relative: 3 % (ref 0–5)
HCT: 36.1 % (ref 36.0–46.0)
Hemoglobin: 12.4 g/dL (ref 12.0–15.0)
Lymphocytes Relative: 51 % — ABNORMAL HIGH (ref 12–46)
Lymphs Abs: 3.3 10*3/uL (ref 0.7–4.0)
MCH: 32.2 pg (ref 26.0–34.0)
MCHC: 34.3 g/dL (ref 30.0–36.0)
MCV: 93.8 fL (ref 78.0–100.0)
MONOS PCT: 7 % (ref 3–12)
Monocytes Absolute: 0.5 10*3/uL (ref 0.1–1.0)
NEUTROS PCT: 39 % — AB (ref 43–77)
Neutro Abs: 2.5 10*3/uL (ref 1.7–7.7)
PLATELETS: 162 10*3/uL (ref 150–400)
RBC: 3.85 MIL/uL — AB (ref 3.87–5.11)
RDW: 14.1 % (ref 11.5–15.5)
WBC: 6.6 10*3/uL (ref 4.0–10.5)

## 2013-09-27 LAB — BONE MARROW EXAM

## 2013-09-27 MED ORDER — MIDAZOLAM HCL 10 MG/2ML IJ SOLN
10.0000 mg | Freq: Once | INTRAMUSCULAR | Status: DC
  Filled 2013-09-27: qty 2

## 2013-09-27 MED ORDER — SODIUM CHLORIDE 0.9 % IV SOLN
INTRAVENOUS | Status: DC
  Administered 2013-09-27: 08:00:00 via INTRAVENOUS

## 2013-09-27 MED ORDER — FENTANYL CITRATE 0.05 MG/ML IJ SOLN
100.0000 ug | Freq: Once | INTRAMUSCULAR | Status: DC
  Filled 2013-09-27: qty 2

## 2013-09-27 MED ORDER — FENTANYL CITRATE 0.05 MG/ML IJ SOLN
INTRAMUSCULAR | Status: AC | PRN
  Administered 2013-09-27 (×4): 25 ug via INTRAVENOUS

## 2013-09-27 MED ORDER — MIDAZOLAM HCL 2 MG/2ML IJ SOLN
INTRAMUSCULAR | Status: AC | PRN
  Administered 2013-09-27: 2 mg via INTRAVENOUS
  Administered 2013-09-27: 5 mg via INTRAVENOUS
  Administered 2013-09-27: 3 mg via INTRAVENOUS

## 2013-09-27 NOTE — Discharge Instructions (Signed)
Bone Marrow Aspiration, Bone Marrow Biopsy °Care After °Read the instructions outlined below and refer to this sheet in the next few weeks. These discharge instructions provide you with general information on caring for yourself after you leave the hospital. Your caregiver may also give you specific instructions. While your treatment has been planned according to the most current medical practices available, unavoidable complications occasionally occur. If you have any problems or questions after discharge, call your caregiver. °FINDING OUT THE RESULTS OF YOUR TEST °Not all test results are available during your visit. If your test results are not back during the visit, make an appointment with your caregiver to find out the results. Do not assume everything is normal if you have not heard from your caregiver or the medical facility. It is important for you to follow up on all of your test results.  °HOME CARE INSTRUCTIONS  °You have had sedation and may be sleepy or dizzy. Your thinking may not be as clear as usual. For the next 24 hours: °· Only take over-the-counter or prescription medicines for pain, discomfort, and or fever as directed by your caregiver. °· Do not drink alcohol. °· Do not smoke. °· Do not drive. °· Do not make important legal decisions. °· Do not operate heavy machinery. °· Do not care for small children by yourself. °· Keep your dressing clean and dry. You may replace dressing with a bandage after 24 hours. °· You may take a bath or shower after 24 hours. °· Use an ice pack for 20 minutes every 2 hours while awake for pain as needed. °SEEK MEDICAL CARE IF:  °· There is redness, swelling, or increasing pain at the biopsy site. °· There is pus coming from the biopsy site. °· There is drainage from a biopsy site lasting longer than one day. °· An unexplained oral temperature above 102° F (38.9° C) develops. °SEEK IMMEDIATE MEDICAL CARE IF:  °· You develop a rash. °· You have difficulty  breathing. °· You develop any reaction or side effects to medications given. °Document Released: 07/30/2004 Document Revised: 04/04/2011 Document Reviewed: 01/08/2008 °ExitCare® Patient Information ©2015 [employer]. This information is not intended to replace advice given to you by your health care provider. Make sure you discuss any questions you have with your health care provider. °Conscious Sedation, Adult, Care After °Refer to this sheet in the next few weeks. These instructions provide you with information on caring for yourself after your procedure. Your health care provider may also give you more specific instructions. Your treatment has been planned according to current medical practices, but problems sometimes occur. Call your health care provider if you have any problems or questions after your procedure. °WHAT TO EXPECT AFTER THE PROCEDURE  °After your procedure: °· You may feel sleepy, clumsy, and have poor balance for several hours. °· Vomiting may occur if you eat too soon after the procedure. °HOME CARE INSTRUCTIONS °· Do not participate in any activities where you could become injured for at least 24 hours. Do not: °¨ Drive. °¨ Swim. °¨ Ride a bicycle. °¨ Operate heavy machinery. °¨ Cook. °¨ Use power tools. °¨ Climb ladders. °¨ Work from a high place. °· Do not make important decisions or sign legal documents until you are improved. °· If you vomit, drink water, juice, or soup when you can drink without vomiting. Make sure you have little or no nausea before eating solid foods. °· Only take over-the-counter or prescription medicines for pain, discomfort, or fever   as directed by your health care provider.  Make sure you and your family fully understand everything about the medicines given to you, including what side effects may occur.  You should not drink alcohol, take sleeping pills, or take medicines that cause drowsiness for at least 24 hours.  If you smoke, do not smoke without  supervision.  If you are feeling better, you may resume normal activities 24 hours after you were sedated.  Keep all appointments with your health care provider. SEEK MEDICAL CARE IF:  Your skin is pale or bluish in color.  You continue to feel nauseous or vomit.  Your pain is getting worse and is not helped by medicine.  You have bleeding or swelling.  You are still sleepy or feeling clumsy after 24 hours. SEEK IMMEDIATE MEDICAL CARE IF:  You develop a rash.  You have difficulty breathing.  You develop any type of allergic problem.  You have a fever. MAKE SURE YOU:  Understand these instructions.  Will watch your condition.  Will get help right away if you are not doing well or get worse. Document Released: 10/31/2012 Document Reviewed: 10/31/2012 [facility] Patient Information 2015 [place], [place]. This information is not intended to replace advice given to you by your health care provider. Make sure you discuss any questions you have with your health care provider.

## 2013-09-27 NOTE — CV Procedure (Signed)
Brief examination was performed. ENT: adequate airway clearance Heart: regular rate and rhythm.No Murmurs Lungs: clear to auscultation, no wheezes, normal respiratory effort  American Society of Anesthesiologists ASA scale 2  Mallampati Score of 3  Bone Marrow Biopsy and Aspiration Procedure Note   Informed consent was obtained and potential risks including bleeding, infection and pain were reviewed with the patient. I verified that the patient has been fasting since midnight.  The patient's name, date of birth, identification, consent and allergies were verified prior to the start of procedure and time out was performed.  A total of 10 mg of IV Versed and 100 mcg of IV fentanyl were given.  The right posterior iliac crest was chosen as the site of biopsy.  The skin was prepped with Betadine solution.   8 cc of 1% lidocaine was used to provide local anaesthesia.   10 cc of bone marrow aspirate was obtained followed by 1 inch biopsy.   The procedure was tolerated well and there were no complications.  The patient was stable at the end of the procedure.  Specimens sent for flow cytometry, cytogenetics and additional studies.

## 2013-09-27 NOTE — Telephone Encounter (Signed)
appt added per staff/////per staff pt daughter aware

## 2013-10-03 LAB — CHROMOSOME ANALYSIS, BONE MARROW

## 2013-10-03 LAB — TISSUE HYBRIDIZATION (BONE MARROW)-NCBH

## 2013-10-07 ENCOUNTER — Other Ambulatory Visit: Payer: Self-pay | Admitting: Hematology and Oncology

## 2013-10-07 ENCOUNTER — Telehealth: Payer: Self-pay | Admitting: Hematology and Oncology

## 2013-10-07 ENCOUNTER — Encounter: Payer: Self-pay | Admitting: Hematology and Oncology

## 2013-10-07 ENCOUNTER — Ambulatory Visit (HOSPITAL_BASED_OUTPATIENT_CLINIC_OR_DEPARTMENT_OTHER): Payer: Commercial Managed Care - HMO | Admitting: Hematology and Oncology

## 2013-10-07 VITALS — BP 113/62 | HR 114 | Temp 99.0°F | Resp 18 | Ht 62.0 in | Wt 133.9 lb

## 2013-10-07 DIAGNOSIS — C8290 Follicular lymphoma, unspecified, unspecified site: Secondary | ICD-10-CM

## 2013-10-07 DIAGNOSIS — C8299 Follicular lymphoma, unspecified, extranodal and solid organ sites: Secondary | ICD-10-CM

## 2013-10-07 DIAGNOSIS — F17200 Nicotine dependence, unspecified, uncomplicated: Secondary | ICD-10-CM

## 2013-10-07 DIAGNOSIS — Z8572 Personal history of non-Hodgkin lymphomas: Secondary | ICD-10-CM | POA: Insufficient documentation

## 2013-10-07 HISTORY — DX: Follicular lymphoma, unspecified, unspecified site: C82.90

## 2013-10-07 MED ORDER — ALLOPURINOL 300 MG PO TABS: 300.0000 mg | ORAL_TABLET | Freq: Every day | ORAL | Status: DC

## 2013-10-07 MED ORDER — PREDNISONE 20 MG PO TABS
40.0000 mg | ORAL_TABLET | Freq: Every day | ORAL | Status: DC
Start: 2013-10-07 — End: 2013-10-25

## 2013-10-07 NOTE — Assessment & Plan Note (Signed)
I spent some time counseling the patient the importance of tobacco cessation. She is currently not interested to quit now. 

## 2013-10-07 NOTE — Progress Notes (Signed)
[facility] OFFICE PROGRESS NOTE  Patient Care Team: Birdie Riddle, MD as PCP - General (Internal Medicine)  SUMMARY OF ONCOLOGIC HISTORY: Oncology History   Follicular lymphoma, FLIPI score of 3 due to age >53, extranodal disease >4 and stage IV at presentation   Primary site: Lymphoid Neoplasms   Staging method: AJCC 6th Edition   Clinical: Stage IV signed by Heath Lark, MD on 10/07/2013  3:59 PM   Summary: Stage IV       Follicular lymphoma   5/80/9983 Imaging PET/CT scan showed diffuse lymphadenopathy and splenomegaly.   09/27/2013 Bone Marrow Biopsy Bone marrow biopsy show lymphoma involvement most consistent with follicular lymphoma.    INTERVAL HISTORY: Please see below for problem oriented charting. She complained of poor appetite. She denies pain  REVIEW OF SYSTEMS:   Constitutional: Denies fevers, chills or abnormal weight loss Eyes: Denies blurriness of vision Ears, nose, mouth, throat, and face: Denies mucositis or sore throat Respiratory: Denies cough, dyspnea or wheezes Cardiovascular: Denies palpitation, chest discomfort or lower extremity swelling Gastrointestinal:  Denies nausea, heartburn or change in bowel habits Skin: Denies abnormal skin rashes Lymphatics: Denies new lymphadenopathy or easy bruising Neurological:Denies numbness, tingling or new weaknesses Behavioral/Psych: Mood is stable, no new changes  All other systems were reviewed with the patient and are negative.  I have reviewed the past medical history, past surgical history, social history and family history with the patient and they are unchanged from previous note.  ALLERGIES:  has No Known Allergies.  MEDICATIONS:  Current Outpatient Prescriptions  Medication Sig Dispense Refill  . aspirin 81 MG tablet Take 81 mg by mouth daily.        [name] atorvastatin (LIPITOR) 20 MG tablet Take 20 mg by mouth daily.      . clopidogrel (PLAVIX) 75 MG tablet Take 75 mg by mouth daily.      .  famotidine (PEPCID) 20 MG tablet Take 20 mg by mouth 2 (two) times daily.      [name] HYDROcodone-acetaminophen (NORCO) 10-325 MG per tablet Take 1-2 tablets by mouth every 4 (four) hours as needed for pain.       [name] LORazepam (ATIVAN) 2 MG tablet Take 2 mg by mouth 3 (three) times daily as needed for anxiety.      [name] losartan (COZAAR) 100 MG tablet Take 100 mg by mouth 2 (two) times daily.       . megestrol (MEGACE) 40 MG/ML suspension Take 200 mg by mouth 2 (two) times a week.       [name] oxybutynin (DITROPAN-XL) 5 MG 24 hr tablet Take 5 mg by mouth daily.       . polyethylene glycol powder (GLYCOLAX/MIRALAX) powder       . allopurinol (ZYLOPRIM) 300 MG tablet Take 1 tablet (300 mg total) by mouth daily.  30 tablet  0  . nitroGLYCERIN (NITROLINGUAL) 0.4 MG/SPRAY spray Place 1 spray under the tongue every 5 (five) minutes x 3 doses as needed for chest pain.      . predniSONE (DELTASONE) 20 MG tablet Take 2 tablets (40 mg total) by mouth daily with breakfast.  30 tablet  0   No current facility-administered medications for this visit.    PHYSICAL EXAMINATION: ECOG PERFORMANCE STATUS: 1 - Symptomatic but completely ambulatory  Filed Vitals:   10/07/13 1532  BP: 113/62  Pulse: 114  Temp: 99 F (37.2 C)  Resp: 18   Filed Weights   10/07/13 1532  Weight: 133  lb 14.4 oz (60.737 kg)    GENERAL:alert, no distress and comfortable SKIN: skin color, texture, turgor are normal, no rashes or significant lesions EYES: normal, Conjunctiva are pink and non-injected, sclera clear Musculoskeletal:no cyanosis of digits and no clubbing  NEURO: alert & oriented x 3 with fluent speech, no focal motor/sensory deficits. She avoids eye contact  LABORATORY DATA:  I have reviewed the data as listed    Component Value Date/Time   NA 142 08/14/2013 1401   NA 140 06/25/2012 1651   K 4.0 08/14/2013 1401   K 4.4 06/25/2012 1651   CL 106 06/25/2012 1651   CO2 23 08/14/2013 1401   CO2 27 06/25/2012 1651   GLUCOSE 97  08/14/2013 1401   GLUCOSE 112* 06/25/2012 1651   BUN 15.5 08/14/2013 1401   BUN 15 09/10/2012 1638   CREATININE 0.8 08/14/2013 1401   CREATININE 0.9 09/10/2012 1638   CREATININE 0.86 05/12/2006 1511   CALCIUM 8.8 08/14/2013 1401   CALCIUM 9.2 06/25/2012 1651   PROT 7.0 08/14/2013 1401   PROT 7.4 06/25/2012 1651   ALBUMIN 3.6 08/14/2013 1401   ALBUMIN 3.7 06/25/2012 1651   AST 16 08/14/2013 1401   AST 13 06/25/2012 1651   ALT 16 08/14/2013 1401   ALT 11 06/25/2012 1651   ALKPHOS 85 08/14/2013 1401   ALKPHOS 83 06/25/2012 1651   BILITOT 0.41 08/14/2013 1401   BILITOT 0.4 06/25/2012 1651   GFRNONAA 83* 06/25/2012 1651   GFRAA >90 06/25/2012 1651    No results found for this basename: SPEP, UPEP,  kappa and lambda light chains    Lab Results  Component Value Date   WBC 6.6 09/27/2013   NEUTROABS 2.5 09/27/2013   HGB 12.4 09/27/2013   HCT 36.1 09/27/2013   MCV 93.8 09/27/2013   PLT 162 09/27/2013      Chemistry      Component Value Date/Time   NA 142 08/14/2013 1401   NA 140 06/25/2012 1651   K 4.0 08/14/2013 1401   K 4.4 06/25/2012 1651   CL 106 06/25/2012 1651   CO2 23 08/14/2013 1401   CO2 27 06/25/2012 1651   BUN 15.5 08/14/2013 1401   BUN 15 09/10/2012 1638   CREATININE 0.8 08/14/2013 1401   CREATININE 0.9 09/10/2012 1638   CREATININE 0.86 05/12/2006 1511      Component Value Date/Time   CALCIUM 8.8 08/14/2013 1401   CALCIUM 9.2 06/25/2012 1651   ALKPHOS 85 08/14/2013 1401   ALKPHOS 83 06/25/2012 1651   AST 16 08/14/2013 1401   AST 13 06/25/2012 1651   ALT 16 08/14/2013 1401   ALT 11 06/25/2012 1651   BILITOT 0.41 08/14/2013 1401   BILITOT 0.4 06/25/2012 1651      ASSESSMENT & PLAN:  Follicular lymphoma We reviewed the results of bone marrow biopsy. I will review her case at the next tumor Board tomorrow. I review with her the national cancer guidelines related to treatment. The patient needs treatment because she is anorexia with progressive weight loss. We discussed the role of chemotherapy. The intent is for  palliative.  We discussed some of the risks, benefits, side-effects of Rituximab.  Some of the short term side-effects included, though not limited to, risk of fatigue, pancytopenia, life-threatening infections, allergic reactions, nausea, vomiting, sores in the mouth, changes in bowel habits especially diarrhea, admission to hospital for various reasons, and risks of death.   The patient is aware that the response rates discussed earlier is not guaranteed.  After a long discussion, patient made an informed decision to proceed with the prescribed plan of care. I will refer her to attend chemotherapy education class. I will recheck her blood work this week. She will proceed with treatment next week and then I see her prior to second dose. In the meantime, I will prescribe allopurinol for tumor lysis prophylaxis and will go ahead and start her on prednisone for anorexia.  TOBACCO DEPENDENCE I spent some time counseling the patient the importance of tobacco cessation. She is currently not interested to quit now.    Orders Placed This Encounter  Procedures  . Hepatitis B core antibody, IgM    Standing Status: Future     Number of Occurrences:      Standing Expiration Date: 11/11/2014  . Hepatitis B surface antibody    Standing Status: Future     Number of Occurrences:      Standing Expiration Date: 11/11/2014  . Hepatitis B surface antigen    Standing Status: Future     Number of Occurrences:      Standing Expiration Date: 11/11/2014  . Comprehensive metabolic panel    Standing Status: Future     Number of Occurrences:      Standing Expiration Date: 11/11/2014  . CBC with Differential    Standing Status: Future     Number of Occurrences:      Standing Expiration Date: 11/11/2014   All questions were answered. The patient knows to call the clinic with any problems, questions or concerns. No barriers to learning was detected. I spent 30 minutes counseling the patient face to  face. The total time spent in the appointment was 40 minutes and more than 50% was on counseling and review of test results     [facility], [place], MD 10/07/2013 4:06 PM

## 2013-10-07 NOTE — Telephone Encounter (Signed)
Pt confirmed labs/ov per 09/14 POF, sent msg to add chemo......gave pt AVS....KJ

## 2013-10-07 NOTE — Assessment & Plan Note (Signed)
We reviewed the results of bone marrow biopsy. I will review her case at the next tumor Board tomorrow. I review with her the national cancer guidelines related to treatment. The patient needs treatment because she is anorexia with progressive weight loss. We discussed the role of chemotherapy. The intent is for palliative.  We discussed some of the risks, benefits, side-effects of Rituximab.  Some of the short term side-effects included, though not limited to, risk of fatigue, pancytopenia, life-threatening infections, allergic reactions, nausea, vomiting, sores in the mouth, changes in bowel habits especially diarrhea, admission to hospital for various reasons, and risks of death.   The patient is aware that the response rates discussed earlier is not guaranteed.    After a long discussion, patient made an informed decision to proceed with the prescribed plan of care. I will refer her to attend chemotherapy education class. I will recheck her blood work this week. She will proceed with treatment next week and then I see her prior to second dose. In the meantime, I will prescribe allopurinol for tumor lysis prophylaxis and will go ahead and start her on prednisone for anorexia.

## 2013-10-08 ENCOUNTER — Telehealth: Payer: Self-pay | Admitting: *Deleted

## 2013-10-08 NOTE — Telephone Encounter (Signed)
Per staff message and POF I have scheduled appts. Advised scheduler of appts. JMW  

## 2013-10-11 ENCOUNTER — Other Ambulatory Visit: Payer: Commercial Managed Care - HMO

## 2013-10-11 ENCOUNTER — Other Ambulatory Visit (HOSPITAL_BASED_OUTPATIENT_CLINIC_OR_DEPARTMENT_OTHER): Payer: Commercial Managed Care - HMO

## 2013-10-11 ENCOUNTER — Encounter: Payer: Self-pay | Admitting: *Deleted

## 2013-10-11 DIAGNOSIS — C8290 Follicular lymphoma, unspecified, unspecified site: Secondary | ICD-10-CM

## 2013-10-11 DIAGNOSIS — C8299 Follicular lymphoma, unspecified, extranodal and solid organ sites: Secondary | ICD-10-CM

## 2013-10-11 LAB — CBC WITH DIFFERENTIAL/PLATELET
BASO%: 0.6 % (ref 0.0–2.0)
BASOS ABS: 0.1 10*3/uL (ref 0.0–0.1)
EOS%: 0.3 % (ref 0.0–7.0)
Eosinophils Absolute: 0 10*3/uL (ref 0.0–0.5)
HEMATOCRIT: 35.5 % (ref 34.8–46.6)
HEMOGLOBIN: 11.8 g/dL (ref 11.6–15.9)
LYMPH#: 3.2 10*3/uL (ref 0.9–3.3)
LYMPH%: 32.4 % (ref 14.0–49.7)
MCH: 32.4 pg (ref 25.1–34.0)
MCHC: 33.1 g/dL (ref 31.5–36.0)
MCV: 97.8 fL (ref 79.5–101.0)
MONO#: 0.9 10*3/uL (ref 0.1–0.9)
MONO%: 8.9 % (ref 0.0–14.0)
NEUT%: 57.8 % (ref 38.4–76.8)
NEUTROS ABS: 5.8 10*3/uL (ref 1.5–6.5)
Platelets: 186 10*3/uL (ref 145–400)
RBC: 3.63 10*6/uL — ABNORMAL LOW (ref 3.70–5.45)
RDW: 13.6 % (ref 11.2–14.5)
WBC: 10 10*3/uL (ref 3.9–10.3)

## 2013-10-11 LAB — COMPREHENSIVE METABOLIC PANEL (CC13)
ALT: 8 U/L (ref 0–55)
ANION GAP: 9 meq/L (ref 3–11)
AST: 10 U/L (ref 5–34)
Albumin: 3.5 g/dL (ref 3.5–5.0)
Alkaline Phosphatase: 87 U/L (ref 40–150)
BILIRUBIN TOTAL: 0.31 mg/dL (ref 0.20–1.20)
BUN: 26 mg/dL (ref 7.0–26.0)
CALCIUM: 8.8 mg/dL (ref 8.4–10.4)
CHLORIDE: 109 meq/L (ref 98–109)
CO2: 23 meq/L (ref 22–29)
CREATININE: 1 mg/dL (ref 0.6–1.1)
GLUCOSE: 112 mg/dL (ref 70–140)
Potassium: 3.9 mEq/L (ref 3.5–5.1)
Sodium: 141 mEq/L (ref 136–145)
Total Protein: 6.9 g/dL (ref 6.4–8.3)

## 2013-10-11 LAB — HEPATITIS B SURFACE ANTIGEN: Hepatitis B Surface Ag: NEGATIVE

## 2013-10-11 LAB — HEPATITIS B CORE ANTIBODY, IGM: Hep B C IgM: NONREACTIVE

## 2013-10-11 LAB — HEPATITIS B SURFACE ANTIBODY,QUALITATIVE: Hep B S Ab: NEGATIVE

## 2013-10-11 NOTE — Progress Notes (Signed)
Counselor was called by nurse post-chemo class to talk with patient and daughter. Meeting lasted close to two hours, and a variety of family issues were discussed including patients desire to not pursue cancer treatment. Daughter presented as engaged and voiced concern for mother's affect; patient's affect was flattened but would engage with counselor when prompted.   Daughter expressed desire to follow-up with counselor both individually and family unit as patient enters treatment.  [name]  Counseling Intern [phone]

## 2013-10-18 ENCOUNTER — Ambulatory Visit (HOSPITAL_BASED_OUTPATIENT_CLINIC_OR_DEPARTMENT_OTHER): Payer: Commercial Managed Care - HMO

## 2013-10-18 VITALS — BP 121/57 | HR 83 | Temp 97.8°F | Resp 18

## 2013-10-18 DIAGNOSIS — C8290 Follicular lymphoma, unspecified, unspecified site: Secondary | ICD-10-CM

## 2013-10-18 DIAGNOSIS — Z5112 Encounter for antineoplastic immunotherapy: Secondary | ICD-10-CM

## 2013-10-18 DIAGNOSIS — C8299 Follicular lymphoma, unspecified, extranodal and solid organ sites: Secondary | ICD-10-CM

## 2013-10-18 MED ORDER — ACETAMINOPHEN 325 MG PO TABS
650.0000 mg | ORAL_TABLET | Freq: Once | ORAL | Status: AC
  Administered 2013-10-18: 650 mg via ORAL

## 2013-10-18 MED ORDER — SODIUM CHLORIDE 0.9 % IV SOLN
Freq: Once | INTRAVENOUS | Status: AC
  Administered 2013-10-18: 11:00:00 via INTRAVENOUS

## 2013-10-18 MED ORDER — DIPHENHYDRAMINE HCL 25 MG PO CAPS
ORAL_CAPSULE | ORAL | Status: AC
  Filled 2013-10-18: qty 2

## 2013-10-18 MED ORDER — DIPHENHYDRAMINE HCL 25 MG PO CAPS
50.0000 mg | ORAL_CAPSULE | Freq: Once | ORAL | Status: AC
  Administered 2013-10-18: 50 mg via ORAL

## 2013-10-18 MED ORDER — ACETAMINOPHEN 325 MG PO TABS
ORAL_TABLET | ORAL | Status: AC
  Filled 2013-10-18: qty 2

## 2013-10-18 MED ORDER — SODIUM CHLORIDE 0.9 % IV SOLN
375.0000 mg/m2 | Freq: Once | INTRAVENOUS | Status: AC
  Administered 2013-10-18: 600 mg via INTRAVENOUS
  Filled 2013-10-18: qty 60

## 2013-10-18 NOTE — Patient Instructions (Signed)
[facility] Discharge Instructions for Patients Receiving Chemotherapy  Today you received the following chemotherapy agents: Rituxan. To help prevent nausea and vomiting after your treatment, we encourage you to take your nausea medication.   If you develop nausea and vomiting that is not controlled by your nausea medication, call the clinic.   BELOW ARE SYMPTOMS THAT SHOULD BE REPORTED IMMEDIATELY:  *FEVER GREATER THAN 100.5 F  *CHILLS WITH OR WITHOUT FEVER  NAUSEA AND VOMITING THAT IS NOT CONTROLLED WITH YOUR NAUSEA MEDICATION  *UNUSUAL SHORTNESS OF BREATH  *UNUSUAL BRUISING OR BLEEDING  TENDERNESS IN MOUTH AND THROAT WITH OR WITHOUT PRESENCE OF ULCERS  *URINARY PROBLEMS  *BOWEL PROBLEMS  UNUSUAL RASH Items with * indicate a potential emergency and should be followed up as soon as possible.  Feel free to call the clinic you have any questions or concerns. The clinic phone number is [phone].    

## 2013-10-21 ENCOUNTER — Telehealth: Payer: Self-pay | Admitting: *Deleted

## 2013-10-21 ENCOUNTER — Other Ambulatory Visit: Payer: Self-pay | Admitting: Hematology and Oncology

## 2013-10-21 DIAGNOSIS — C8290 Follicular lymphoma, unspecified, unspecified site: Secondary | ICD-10-CM

## 2013-10-21 MED ORDER — ONDANSETRON HCL 8 MG PO TABS: 8.0000 mg | ORAL_TABLET | Freq: Three times a day (TID) | ORAL | Status: DC | PRN

## 2013-10-21 NOTE — Telephone Encounter (Signed)
Called [facility] for chemotherapy F/U.  Patient is tired but doing well.  Thinks she did too much yesterday and plans to rest today.  "That AVS sheet talks about nausea medicine and I don't have any nausea medicine."  Discussed side effects of rituxan and she would like something for nausea on hand.  Denies n/v stating "I am eating too much because of the prednisone I finished today.  I have gained three pounds".  Denies swelling or any new side effects or symptoms.  Bowel and bladder is functioning well.  Takes Mira lax for bowels.  Eating and drinking well drinking one Ensure every morning.  Instructed to drink 64 oz minimum daily or at least the day before, of and after treatment.  Questions answered about appointment and arrival times.  Denies further questions at this time and encouraged to call if needed.  Reviewed how to call after hours in the case of an emergency.  [facility] at [address] and [address].

## 2013-10-21 NOTE — Telephone Encounter (Signed)
I e-scribed zofran

## 2013-10-21 NOTE — Telephone Encounter (Addendum)
Called patient's home and mobile number to notify of new anti-emetic ordered.  No answer at home x 2.  Mobile number busy x 2.  Will continue to try to reach today.  [phone] reached patient at [phone].  Zofran information given.  Verbalized understanding.

## 2013-10-21 NOTE — Telephone Encounter (Signed)
Message copied by Cherylynn Ridges on Mon Oct 21, 2013 10:37 AM ------      Message from: Cora Collum      Created: Fri Oct 18, 2013  3:20 PM      Regarding: follow up chemo call      Contact: [phone]       1st time Rituxan Dr.  Alvy Bimler ------

## 2013-10-25 ENCOUNTER — Other Ambulatory Visit (HOSPITAL_BASED_OUTPATIENT_CLINIC_OR_DEPARTMENT_OTHER): Payer: Commercial Managed Care - HMO

## 2013-10-25 ENCOUNTER — Telehealth: Payer: Self-pay | Admitting: Hematology and Oncology

## 2013-10-25 ENCOUNTER — Ambulatory Visit (HOSPITAL_BASED_OUTPATIENT_CLINIC_OR_DEPARTMENT_OTHER): Payer: Commercial Managed Care - HMO | Admitting: Hematology and Oncology

## 2013-10-25 ENCOUNTER — Ambulatory Visit (HOSPITAL_BASED_OUTPATIENT_CLINIC_OR_DEPARTMENT_OTHER): Payer: Commercial Managed Care - HMO

## 2013-10-25 ENCOUNTER — Encounter: Payer: Self-pay | Admitting: Hematology and Oncology

## 2013-10-25 VITALS — BP 132/82 | HR 99 | Temp 98.7°F | Resp 18 | Ht 62.0 in | Wt 140.9 lb

## 2013-10-25 VITALS — BP 105/53 | HR 71 | Temp 98.6°F | Resp 18

## 2013-10-25 DIAGNOSIS — Z720 Tobacco use: Secondary | ICD-10-CM

## 2013-10-25 DIAGNOSIS — C8290 Follicular lymphoma, unspecified, unspecified site: Secondary | ICD-10-CM

## 2013-10-25 DIAGNOSIS — Z5112 Encounter for antineoplastic immunotherapy: Secondary | ICD-10-CM

## 2013-10-25 LAB — CBC WITH DIFFERENTIAL/PLATELET
BASO%: 0.6 % (ref 0.0–2.0)
Basophils Absolute: 0 10*3/uL (ref 0.0–0.1)
EOS%: 2.7 % (ref 0.0–7.0)
Eosinophils Absolute: 0.2 10*3/uL (ref 0.0–0.5)
HCT: 35.9 % (ref 34.8–46.6)
HGB: 11.8 g/dL (ref 11.6–15.9)
LYMPH%: 20.3 % (ref 14.0–49.7)
MCH: 32.1 pg (ref 25.1–34.0)
MCHC: 33 g/dL (ref 31.5–36.0)
MCV: 97.2 fL (ref 79.5–101.0)
MONO#: 0.5 10*3/uL (ref 0.1–0.9)
MONO%: 9.1 % (ref 0.0–14.0)
NEUT#: 4 10*3/uL (ref 1.5–6.5)
NEUT%: 67.3 % (ref 38.4–76.8)
Platelets: 153 10*3/uL (ref 145–400)
RBC: 3.69 10*6/uL — ABNORMAL LOW (ref 3.70–5.45)
RDW: 14.8 % — ABNORMAL HIGH (ref 11.2–14.5)
WBC: 5.9 10*3/uL (ref 3.9–10.3)
lymph#: 1.2 10*3/uL (ref 0.9–3.3)

## 2013-10-25 LAB — COMPREHENSIVE METABOLIC PANEL (CC13)
ALK PHOS: 70 U/L (ref 40–150)
ALT: 15 U/L (ref 0–55)
AST: 15 U/L (ref 5–34)
Albumin: 3.3 g/dL — ABNORMAL LOW (ref 3.5–5.0)
Anion Gap: 7 mEq/L (ref 3–11)
BUN: 19.5 mg/dL (ref 7.0–26.0)
CO2: 24 mEq/L (ref 22–29)
Calcium: 8.6 mg/dL (ref 8.4–10.4)
Chloride: 108 mEq/L (ref 98–109)
Creatinine: 0.9 mg/dL (ref 0.6–1.1)
Glucose: 107 mg/dl (ref 70–140)
Potassium: 4.4 mEq/L (ref 3.5–5.1)
Sodium: 140 mEq/L (ref 136–145)
Total Bilirubin: 0.51 mg/dL (ref 0.20–1.20)
Total Protein: 6.3 g/dL — ABNORMAL LOW (ref 6.4–8.3)

## 2013-10-25 LAB — LACTATE DEHYDROGENASE (CC13): LDH: 233 U/L (ref 125–245)

## 2013-10-25 MED ORDER — PREDNISONE 20 MG PO TABS: 20.0000 mg | ORAL_TABLET | Freq: Every day | ORAL | Status: DC

## 2013-10-25 MED ORDER — DIPHENHYDRAMINE HCL 25 MG PO CAPS
ORAL_CAPSULE | ORAL | Status: AC
  Filled 2013-10-25: qty 2

## 2013-10-25 MED ORDER — SODIUM CHLORIDE 0.9 % IV SOLN
Freq: Once | INTRAVENOUS | Status: AC
  Administered 2013-10-25: 10:00:00 via INTRAVENOUS

## 2013-10-25 MED ORDER — SODIUM CHLORIDE 0.9 % IV SOLN
375.0000 mg/m2 | Freq: Once | INTRAVENOUS | Status: AC
  Administered 2013-10-25: 600 mg via INTRAVENOUS
  Filled 2013-10-25: qty 60

## 2013-10-25 MED ORDER — SODIUM CHLORIDE 0.9 % IV SOLN: 375.0000 mg/m2 | Freq: Once | INTRAVENOUS | Status: DC

## 2013-10-25 MED ORDER — DIPHENHYDRAMINE HCL 25 MG PO CAPS
50.0000 mg | ORAL_CAPSULE | Freq: Once | ORAL | Status: AC
  Administered 2013-10-25: 50 mg via ORAL

## 2013-10-25 MED ORDER — ACETAMINOPHEN 325 MG PO TABS
650.0000 mg | ORAL_TABLET | Freq: Once | ORAL | Status: AC
  Administered 2013-10-25: 650 mg via ORAL

## 2013-10-25 MED ORDER — ACETAMINOPHEN 325 MG PO TABS
ORAL_TABLET | ORAL | Status: AC
  Filled 2013-10-25: qty 2

## 2013-10-25 NOTE — Assessment & Plan Note (Signed)
I spent some time counseling the patient the importance of tobacco cessation. She is currently not interested to quit now. 

## 2013-10-25 NOTE — Progress Notes (Signed)
[facility] OFFICE PROGRESS NOTE  Patient Care Team: Birdie Riddle, MD as PCP - General (Internal Medicine)  SUMMARY OF ONCOLOGIC HISTORY: Oncology History   Follicular lymphoma, FLIPI score of 3 due to age >68, extranodal disease >4 and stage IV at presentation   Primary site: Lymphoid Neoplasms   Staging method: AJCC 6th Edition   Clinical: Stage IV signed by Heath Lark, MD on 10/07/2013  3:59 PM   Summary: Stage IV       Follicular lymphoma   06/10/6158 Imaging PET/CT scan showed diffuse lymphadenopathy and splenomegaly.   09/27/2013 Bone Marrow Biopsy Bone marrow biopsy show lymphoma involvement most consistent with follicular lymphoma.   10/18/2013 -  Chemotherapy She is given rituximab with prednisone    INTERVAL HISTORY: Please see below for problem oriented charting. She tolerated treatment last week well. She is seen today prior to cycle 2 of treatment. She ran out of prednisone recently and have reduced appetite. She has gained some weight.   REVIEW OF SYSTEMS:   Constitutional: Denies fevers, chills or abnormal weight loss Eyes: Denies blurriness of vision Ears, nose, mouth, throat, and face: Denies mucositis or sore throat Respiratory: Denies cough, dyspnea or wheezes Cardiovascular: Denies palpitation, chest discomfort or lower extremity swelling Gastrointestinal:  Denies nausea, heartburn or change in bowel habits Skin: Denies abnormal skin rashes Lymphatics: Denies new lymphadenopathy or easy bruising Neurological:Denies numbness, tingling or new weaknesses Behavioral/Psych: Mood is stable, no new changes  All other systems were reviewed with the patient and are negative.  I have reviewed the past medical history, past surgical history, social history and family history with the patient and they are unchanged from previous note.  ALLERGIES:  has No Known Allergies.  MEDICATIONS:  Current Outpatient Prescriptions  Medication Sig Dispense Refill   . allopurinol (ZYLOPRIM) 300 MG tablet TAKE 1 TABLET BY MOUTH DAILY  90 tablet  0  . aspirin 81 MG tablet Take 81 mg by mouth daily.        [name] atorvastatin (LIPITOR) 20 MG tablet Take 20 mg by mouth daily.      . clopidogrel (PLAVIX) 75 MG tablet Take 75 mg by mouth daily.      . famotidine (PEPCID) 20 MG tablet Take 20 mg by mouth 2 (two) times daily.      [name] HYDROcodone-acetaminophen (NORCO) 10-325 MG per tablet Take 1-2 tablets by mouth every 4 (four) hours as needed for pain.       [name] LORazepam (ATIVAN) 2 MG tablet Take 2 mg by mouth 3 (three) times daily as needed for anxiety.      [name] losartan (COZAAR) 100 MG tablet Take 100 mg by mouth 2 (two) times daily.       . megestrol (MEGACE) 40 MG/ML suspension Take 200 mg by mouth 2 (two) times a week.       . nitroGLYCERIN (NITROLINGUAL) 0.4 MG/SPRAY spray Place 1 spray under the tongue every 5 (five) minutes x 3 doses as needed for chest pain.      [name] ondansetron (ZOFRAN) 8 MG tablet Take 1 tablet (8 mg total) by mouth every 8 (eight) hours as needed for nausea.  30 tablet  3  . oxybutynin (DITROPAN-XL) 5 MG 24 hr tablet Take 5 mg by mouth daily.       . polyethylene glycol powder (GLYCOLAX/MIRALAX) powder       . predniSONE (DELTASONE) 20 MG tablet Take 1 tablet (20 mg total) by mouth daily with breakfast.  30 tablet  0   No current facility-administered medications for this visit.   Facility-Administered Medications Ordered in Other Visits  Medication Dose Route Frequency Provider Last Rate Last Dose  . 0.9 %  sodium chloride infusion   Intravenous Once Heath Lark, MD      . acetaminophen (TYLENOL) tablet 650 mg  650 mg Oral Once Heath Lark, MD      . diphenhydrAMINE (BENADRYL) capsule 50 mg  50 mg Oral Once Heath Lark, MD        PHYSICAL EXAMINATION: ECOG PERFORMANCE STATUS: 1 - Symptomatic but completely ambulatory  Filed Vitals:   10/25/13 0840  BP: 132/82  Pulse: 99  Temp: 98.7 F (37.1 C)  Resp: 18   Filed Weights   10/25/13  0840  Weight: 140 lb 14.4 oz (63.912 kg)    GENERAL:alert, no distress and comfortable SKIN: skin color, texture, turgor are normal, no rashes or significant lesions EYES: normal, Conjunctiva are pink and non-injected, sclera clear OROPHARYNX:no exudate, no erythema and lips, buccal mucosa, and tongue normal  NECK: supple, thyroid normal size, non-tender, without nodularity LYMPH:  no palpable lymphadenopathy in the cervical, axillary or inguinal LUNGS: clear to auscultation and percussion with normal breathing effort HEART: regular rate & rhythm and no murmurs and no lower extremity edema ABDOMEN:abdomen soft, non-tender and normal bowel sounds. Splenomegaly is smaller Musculoskeletal:no cyanosis of digits and no clubbing  NEURO: alert & oriented x 3 with fluent speech, no focal motor/sensory deficits  LABORATORY DATA:  I have reviewed the data as listed    Component Value Date/Time   NA 140 10/25/2013 0821   NA 140 06/25/2012 1651   K 4.4 10/25/2013 0821   K 4.4 06/25/2012 1651   CL 106 06/25/2012 1651   CO2 24 10/25/2013 0821   CO2 27 06/25/2012 1651   GLUCOSE 107 10/25/2013 0821   GLUCOSE 112* 06/25/2012 1651   BUN 19.5 10/25/2013 0821   BUN 15 09/10/2012 1638   CREATININE 0.9 10/25/2013 0821   CREATININE 0.9 09/10/2012 1638   CREATININE 0.86 05/12/2006 1511   CALCIUM 8.6 10/25/2013 0821   CALCIUM 9.2 06/25/2012 1651   PROT 6.3* 10/25/2013 0821   PROT 7.4 06/25/2012 1651   ALBUMIN 3.3* 10/25/2013 0821   ALBUMIN 3.7 06/25/2012 1651   AST 15 10/25/2013 0821   AST 13 06/25/2012 1651   ALT 15 10/25/2013 0821   ALT 11 06/25/2012 1651   ALKPHOS 70 10/25/2013 0821   ALKPHOS 83 06/25/2012 1651   BILITOT 0.51 10/25/2013 0821   BILITOT 0.4 06/25/2012 1651   GFRNONAA 83* 06/25/2012 1651   GFRAA >90 06/25/2012 1651    No results found for this basename: SPEP, UPEP,  kappa and lambda light chains    Lab Results  Component Value Date   WBC 5.9 10/25/2013   NEUTROABS 4.0 10/25/2013   HGB 11.8 10/25/2013   HCT 35.9  10/25/2013   MCV 97.2 10/25/2013   PLT 153 10/25/2013      Chemistry      Component Value Date/Time   NA 140 10/25/2013 0821   NA 140 06/25/2012 1651   K 4.4 10/25/2013 0821   K 4.4 06/25/2012 1651   CL 106 06/25/2012 1651   CO2 24 10/25/2013 0821   CO2 27 06/25/2012 1651   BUN 19.5 10/25/2013 0821   BUN 15 09/10/2012 1638   CREATININE 0.9 10/25/2013 0821   CREATININE 0.9 09/10/2012 1638   CREATININE 0.86 05/12/2006 1511  Component Value Date/Time   CALCIUM 8.6 10/25/2013 0821   CALCIUM 9.2 06/25/2012 1651   ALKPHOS 70 10/25/2013 0821   ALKPHOS 83 06/25/2012 1651   AST 15 10/25/2013 0821   AST 13 06/25/2012 1651   ALT 15 10/25/2013 0821   ALT 11 06/25/2012 1651   BILITOT 0.51 10/25/2013 0821   BILITOT 0.4 06/25/2012 1651      ASSESSMENT & PLAN:  Follicular lymphoma She tolerated her first dose rituximab well. I will recommend her to continue 4 doses total along with small dose of daily prednisone. She is not receiving aggressive chemotherapy treatment due to her other comorbidities and refusal for aggressive treatment.  TOBACCO DEPENDENCE I spent some time counseling the patient the importance of tobacco cessation. She is currently not interested to quit now.   No orders of the defined types were placed in this encounter.   All questions were answered. The patient knows to call the clinic with any problems, questions or concerns. No barriers to learning was detected. I spent 25 minutes counseling the patient face to face. The total time spent in the appointment was 30 minutes and more than 50% was on counseling and review of test results     [facility], [place], MD 10/25/2013 9:46 AM

## 2013-10-25 NOTE — Assessment & Plan Note (Signed)
She tolerated her first dose rituximab well. I will recommend her to continue 4 doses total along with small dose of daily prednisone. She is not receiving aggressive chemotherapy treatment due to her other comorbidities and refusal for aggressive treatment.

## 2013-10-25 NOTE — Patient Instructions (Signed)
[facility] Discharge Instructions for Patients Receiving Chemotherapy  Today you received the following chemotherapy agents Rituxan  To help prevent nausea and vomiting after your treatment, we encourage you to take your nausea medication as directed.   If you develop nausea and vomiting that is not controlled by your nausea medication, call the clinic.   BELOW ARE SYMPTOMS THAT SHOULD BE REPORTED IMMEDIATELY:  *FEVER GREATER THAN 100.5 F  *CHILLS WITH OR WITHOUT FEVER  NAUSEA AND VOMITING THAT IS NOT CONTROLLED WITH YOUR NAUSEA MEDICATION  *UNUSUAL SHORTNESS OF BREATH  *UNUSUAL BRUISING OR BLEEDING  TENDERNESS IN MOUTH AND THROAT WITH OR WITHOUT PRESENCE OF ULCERS  *URINARY PROBLEMS  *BOWEL PROBLEMS  UNUSUAL RASH Items with * indicate a potential emergency and should be followed up as soon as possible.  Feel free to call the clinic you have any questions or concerns. The clinic phone number is [phone].    

## 2013-10-25 NOTE — Telephone Encounter (Signed)
gv adn printed appt sched and avs for pt for OCT °

## 2013-11-01 ENCOUNTER — Ambulatory Visit (HOSPITAL_BASED_OUTPATIENT_CLINIC_OR_DEPARTMENT_OTHER): Payer: Commercial Managed Care - HMO

## 2013-11-01 VITALS — BP 145/87 | HR 80 | Temp 97.2°F | Resp 16

## 2013-11-01 DIAGNOSIS — C8299 Follicular lymphoma, unspecified, extranodal and solid organ sites: Secondary | ICD-10-CM

## 2013-11-01 DIAGNOSIS — Z5112 Encounter for antineoplastic immunotherapy: Secondary | ICD-10-CM

## 2013-11-01 DIAGNOSIS — C8290 Follicular lymphoma, unspecified, unspecified site: Secondary | ICD-10-CM

## 2013-11-01 MED ORDER — SODIUM CHLORIDE 0.9 % IV SOLN
375.0000 mg/m2 | Freq: Once | INTRAVENOUS | Status: AC
  Administered 2013-11-01: 600 mg via INTRAVENOUS
  Filled 2013-11-01: qty 60

## 2013-11-01 MED ORDER — ACETAMINOPHEN 325 MG PO TABS
650.0000 mg | ORAL_TABLET | Freq: Once | ORAL | Status: AC
  Administered 2013-11-01: 650 mg via ORAL

## 2013-11-01 MED ORDER — SODIUM CHLORIDE 0.9 % IV SOLN
Freq: Once | INTRAVENOUS | Status: AC
  Administered 2013-11-01: 12:00:00 via INTRAVENOUS

## 2013-11-01 MED ORDER — ACETAMINOPHEN 325 MG PO TABS
ORAL_TABLET | ORAL | Status: AC
  Filled 2013-11-01: qty 2

## 2013-11-01 MED ORDER — DIPHENHYDRAMINE HCL 25 MG PO CAPS
ORAL_CAPSULE | ORAL | Status: AC
  Filled 2013-11-01: qty 2

## 2013-11-01 MED ORDER — DIPHENHYDRAMINE HCL 25 MG PO CAPS
50.0000 mg | ORAL_CAPSULE | Freq: Once | ORAL | Status: AC
  Administered 2013-11-01: 50 mg via ORAL

## 2013-11-01 NOTE — Patient Instructions (Signed)
[facility] Discharge Instructions for Patients Receiving Chemotherapy  Today you received the following chemotherapy agent: Rituxan  To help prevent nausea and vomiting after your treatment, we encourage you to take your nausea medication as directed:  Zofran 8 mg every 8 hours as needed   If you develop nausea and vomiting that is not controlled by your nausea medication, call the clinic.   BELOW ARE SYMPTOMS THAT SHOULD BE REPORTED IMMEDIATELY:  *FEVER GREATER THAN 100.5 F  *CHILLS WITH OR WITHOUT FEVER  NAUSEA AND VOMITING THAT IS NOT CONTROLLED WITH YOUR NAUSEA MEDICATION  *UNUSUAL SHORTNESS OF BREATH  *UNUSUAL BRUISING OR BLEEDING  TENDERNESS IN MOUTH AND THROAT WITH OR WITHOUT PRESENCE OF ULCERS  *URINARY PROBLEMS  *BOWEL PROBLEMS  UNUSUAL RASH Items with * indicate a potential emergency and should be followed up as soon as possible.  Feel free to call the clinic should you have any questions or concerns. The clinic phone number is [phone].  It has been a pleasure to serve you today!

## 2013-11-07 ENCOUNTER — Other Ambulatory Visit: Payer: Self-pay | Admitting: Hematology and Oncology

## 2013-11-07 DIAGNOSIS — C8290 Follicular lymphoma, unspecified, unspecified site: Secondary | ICD-10-CM

## 2013-11-08 ENCOUNTER — Encounter: Payer: Self-pay | Admitting: Hematology and Oncology

## 2013-11-08 ENCOUNTER — Other Ambulatory Visit (HOSPITAL_BASED_OUTPATIENT_CLINIC_OR_DEPARTMENT_OTHER): Payer: Commercial Managed Care - HMO

## 2013-11-08 ENCOUNTER — Ambulatory Visit (HOSPITAL_BASED_OUTPATIENT_CLINIC_OR_DEPARTMENT_OTHER): Payer: Commercial Managed Care - HMO | Admitting: Hematology and Oncology

## 2013-11-08 ENCOUNTER — Telehealth: Payer: Self-pay | Admitting: Hematology and Oncology

## 2013-11-08 ENCOUNTER — Ambulatory Visit (HOSPITAL_BASED_OUTPATIENT_CLINIC_OR_DEPARTMENT_OTHER): Payer: Commercial Managed Care - HMO

## 2013-11-08 VITALS — BP 134/59 | HR 83 | Temp 98.2°F | Resp 18 | Ht 62.0 in | Wt 137.4 lb

## 2013-11-08 VITALS — BP 115/52 | HR 77 | Temp 97.9°F | Resp 16

## 2013-11-08 DIAGNOSIS — I2089 Other forms of angina pectoris: Secondary | ICD-10-CM | POA: Insufficient documentation

## 2013-11-08 DIAGNOSIS — C8290 Follicular lymphoma, unspecified, unspecified site: Secondary | ICD-10-CM

## 2013-11-08 DIAGNOSIS — F17200 Nicotine dependence, unspecified, uncomplicated: Secondary | ICD-10-CM

## 2013-11-08 DIAGNOSIS — I208 Other forms of angina pectoris: Secondary | ICD-10-CM

## 2013-11-08 DIAGNOSIS — Z5112 Encounter for antineoplastic immunotherapy: Secondary | ICD-10-CM

## 2013-11-08 DIAGNOSIS — Z720 Tobacco use: Secondary | ICD-10-CM

## 2013-11-08 LAB — CBC WITH DIFFERENTIAL/PLATELET
BASO%: 0.7 % (ref 0.0–2.0)
BASOS ABS: 0 10*3/uL (ref 0.0–0.1)
EOS ABS: 0.1 10*3/uL (ref 0.0–0.5)
EOS%: 1 % (ref 0.0–7.0)
HEMATOCRIT: 36 % (ref 34.8–46.6)
HGB: 11.9 g/dL (ref 11.6–15.9)
LYMPH#: 1.5 10*3/uL (ref 0.9–3.3)
LYMPH%: 24.2 % (ref 14.0–49.7)
MCH: 32.3 pg (ref 25.1–34.0)
MCHC: 33 g/dL (ref 31.5–36.0)
MCV: 97.8 fL (ref 79.5–101.0)
MONO#: 0.8 10*3/uL (ref 0.1–0.9)
MONO%: 13.2 % (ref 0.0–14.0)
NEUT#: 3.8 10*3/uL (ref 1.5–6.5)
NEUT%: 60.9 % (ref 38.4–76.8)
Platelets: 215 10*3/uL (ref 145–400)
RBC: 3.68 10*6/uL — ABNORMAL LOW (ref 3.70–5.45)
RDW: 14.8 % — ABNORMAL HIGH (ref 11.2–14.5)
WBC: 6.2 10*3/uL (ref 3.9–10.3)

## 2013-11-08 LAB — COMPREHENSIVE METABOLIC PANEL (CC13)
ALT: 18 U/L (ref 0–55)
AST: 13 U/L (ref 5–34)
Albumin: 3.4 g/dL — ABNORMAL LOW (ref 3.5–5.0)
Alkaline Phosphatase: 76 U/L (ref 40–150)
Anion Gap: 9 mEq/L (ref 3–11)
BUN: 25.7 mg/dL (ref 7.0–26.0)
CO2: 24 mEq/L (ref 22–29)
CREATININE: 1.1 mg/dL (ref 0.6–1.1)
Calcium: 9 mg/dL (ref 8.4–10.4)
Chloride: 107 mEq/L (ref 98–109)
Glucose: 101 mg/dl (ref 70–140)
Potassium: 4 mEq/L (ref 3.5–5.1)
Sodium: 139 mEq/L (ref 136–145)
Total Bilirubin: 0.37 mg/dL (ref 0.20–1.20)
Total Protein: 6.6 g/dL (ref 6.4–8.3)

## 2013-11-08 LAB — LACTATE DEHYDROGENASE (CC13): LDH: 228 U/L (ref 125–245)

## 2013-11-08 MED ORDER — SODIUM CHLORIDE 0.9 % IV SOLN
Freq: Once | INTRAVENOUS | Status: AC
  Administered 2013-11-08: 10:00:00 via INTRAVENOUS

## 2013-11-08 MED ORDER — DIPHENHYDRAMINE HCL 25 MG PO CAPS
ORAL_CAPSULE | ORAL | Status: AC
  Filled 2013-11-08: qty 1

## 2013-11-08 MED ORDER — ACETAMINOPHEN 325 MG PO TABS
ORAL_TABLET | ORAL | Status: AC
  Filled 2013-11-08: qty 2

## 2013-11-08 MED ORDER — DIPHENHYDRAMINE HCL 25 MG PO CAPS
50.0000 mg | ORAL_CAPSULE | Freq: Once | ORAL | Status: AC
  Administered 2013-11-08: 50 mg via ORAL

## 2013-11-08 MED ORDER — SODIUM CHLORIDE 0.9 % IV SOLN
375.0000 mg/m2 | Freq: Once | INTRAVENOUS | Status: AC
  Administered 2013-11-08: 600 mg via INTRAVENOUS
  Filled 2013-11-08: qty 60

## 2013-11-08 MED ORDER — ACETAMINOPHEN 325 MG PO TABS
650.0000 mg | ORAL_TABLET | Freq: Once | ORAL | Status: AC
  Administered 2013-11-08: 650 mg via ORAL

## 2013-11-08 NOTE — Patient Instructions (Signed)
[facility] Discharge Instructions for Patients Receiving Chemotherapy  Today you received the following chemotherapy agents: Rituxan   To help prevent nausea and vomiting after your treatment, we encourage you to take your nausea medication as prescribed by your physician.    If you develop nausea and vomiting that is not controlled by your nausea medication, call the clinic.   BELOW ARE SYMPTOMS THAT SHOULD BE REPORTED IMMEDIATELY:  *FEVER GREATER THAN 100.5 F  *CHILLS WITH OR WITHOUT FEVER  NAUSEA AND VOMITING THAT IS NOT CONTROLLED WITH YOUR NAUSEA MEDICATION  *UNUSUAL SHORTNESS OF BREATH  *UNUSUAL BRUISING OR BLEEDING  TENDERNESS IN MOUTH AND THROAT WITH OR WITHOUT PRESENCE OF ULCERS  *URINARY PROBLEMS  *BOWEL PROBLEMS  UNUSUAL RASH Items with * indicate a potential emergency and should be followed up as soon as possible.  Feel free to call the clinic you have any questions or concerns. The clinic phone number is [phone].    

## 2013-11-08 NOTE — Progress Notes (Signed)
[place] OFFICE PROGRESS NOTE  Patient Care Team: Birdie Riddle, MD as PCP - General (Internal Medicine)  SUMMARY OF ONCOLOGIC HISTORY: Oncology History   Follicular lymphoma, FLIPI score of 3 due to age >51, extranodal disease >4 and stage IV at presentation   Primary site: Lymphoid Neoplasms   Staging method: AJCC 6th Edition   Clinical: Stage IV signed by Heath Lark, MD on 10/07/2013  3:59 PM   Summary: Stage IV       Follicular lymphoma   0/08/6759 Imaging PET/CT scan showed diffuse lymphadenopathy and splenomegaly.   09/27/2013 Bone Marrow Biopsy Bone marrow biopsy show lymphoma involvement most consistent with follicular lymphoma.   10/18/2013 - 11/08/2013 Chemotherapy She is given rituximab with prednisone    INTERVAL HISTORY: Please see below for problem oriented charting. She is seen today prior to her final treatment. She denies any allergic reactions. No new lymphadenopathy. She has intermittent chest discomfort in the past few months, not today, and she takes nitroglycerin periodically. She is interested to quit smoking. REVIEW OF SYSTEMS:   Constitutional: Denies fevers, chills or abnormal weight loss Eyes: Denies blurriness of vision Ears, nose, mouth, throat, and face: Denies mucositis or sore throat Respiratory: Denies cough, dyspnea or wheezes Gastrointestinal:  Denies nausea, heartburn or change in bowel habits Skin: Denies abnormal skin rashes Lymphatics: Denies new lymphadenopathy or easy bruising Neurological:Denies numbness, tingling or new weaknesses Behavioral/Psych: Mood is stable, no new changes  All other systems were reviewed with the patient and are negative.  I have reviewed the past medical history, past surgical history, social history and family history with the patient and they are unchanged from previous note.  ALLERGIES:  has No Known Allergies.  MEDICATIONS:  Current Outpatient Prescriptions  Medication Sig Dispense  Refill  . allopurinol (ZYLOPRIM) 300 MG tablet TAKE 1 TABLET BY MOUTH DAILY  90 tablet  0  . aspirin 81 MG tablet Take 81 mg by mouth daily.        [name] atorvastatin (LIPITOR) 20 MG tablet Take 20 mg by mouth daily.      . clopidogrel (PLAVIX) 75 MG tablet Take 75 mg by mouth daily.      . famotidine (PEPCID) 20 MG tablet Take 20 mg by mouth 2 (two) times daily.      [name] HYDROcodone-acetaminophen (NORCO) 10-325 MG per tablet Take 1-2 tablets by mouth every 4 (four) hours as needed for pain.       [name] LORazepam (ATIVAN) 2 MG tablet Take 2 mg by mouth 3 (three) times daily as needed for anxiety.      [name] losartan (COZAAR) 100 MG tablet Take 100 mg by mouth 2 (two) times daily.       . megestrol (MEGACE) 40 MG/ML suspension Take 200 mg by mouth 2 (two) times a week.       . nitroGLYCERIN (NITROLINGUAL) 0.4 MG/SPRAY spray Place 1 spray under the tongue every 5 (five) minutes x 3 doses as needed for chest pain.      [name] ondansetron (ZOFRAN) 8 MG tablet Take 1 tablet (8 mg total) by mouth every 8 (eight) hours as needed for nausea.  30 tablet  3  . oxybutynin (DITROPAN-XL) 5 MG 24 hr tablet Take 5 mg by mouth daily.       . polyethylene glycol powder (GLYCOLAX/MIRALAX) powder       . predniSONE (DELTASONE) 20 MG tablet Take 1 tablet (20 mg total) by mouth daily with breakfast.  30  tablet  0   No current facility-administered medications for this visit.    PHYSICAL EXAMINATION: ECOG PERFORMANCE STATUS: 1 - Symptomatic but completely ambulatory  Filed Vitals:   11/08/13 0823  BP: 134/59  Pulse: 83  Temp: 98.2 F (36.8 C)  Resp: 18   Filed Weights   11/08/13 0823  Weight: 137 lb 6.4 oz (62.324 kg)    GENERAL:alert, no distress and comfortable SKIN: skin color, texture, turgor are normal, no rashes or significant lesions EYES: normal, Conjunctiva are pink and non-injected, sclera clear OROPHARYNX:no exudate, no erythema and lips, buccal mucosa, and tongue normal  Musculoskeletal:no cyanosis of  digits and no clubbing  NEURO: alert & oriented x 3 with fluent speech, no focal motor/sensory deficits  LABORATORY DATA:  I have reviewed the data as listed    Component Value Date/Time   NA 139 11/08/2013 0805   NA 140 06/25/2012 1651   K 4.0 11/08/2013 0805   K 4.4 06/25/2012 1651   CL 106 06/25/2012 1651   CO2 24 11/08/2013 0805   CO2 27 06/25/2012 1651   GLUCOSE 101 11/08/2013 0805   GLUCOSE 112* 06/25/2012 1651   BUN 25.7 11/08/2013 0805   BUN 15 09/10/2012 1638   CREATININE 1.1 11/08/2013 0805   CREATININE 0.9 09/10/2012 1638   CREATININE 0.86 05/12/2006 1511   CALCIUM 9.0 11/08/2013 0805   CALCIUM 9.2 06/25/2012 1651   PROT 6.6 11/08/2013 0805   PROT 7.4 06/25/2012 1651   ALBUMIN 3.4* 11/08/2013 0805   ALBUMIN 3.7 06/25/2012 1651   AST 13 11/08/2013 0805   AST 13 06/25/2012 1651   ALT 18 11/08/2013 0805   ALT 11 06/25/2012 1651   ALKPHOS 76 11/08/2013 0805   ALKPHOS 83 06/25/2012 1651   BILITOT 0.37 11/08/2013 0805   BILITOT 0.4 06/25/2012 1651   GFRNONAA 83* 06/25/2012 1651   GFRAA >90 06/25/2012 1651    No results found for this basename: SPEP, UPEP,  kappa and lambda light chains    Lab Results  Component Value Date   WBC 6.2 11/08/2013   NEUTROABS 3.8 11/08/2013   HGB 11.9 11/08/2013   HCT 36.0 11/08/2013   MCV 97.8 11/08/2013   PLT 215 11/08/2013      Chemistry      Component Value Date/Time   NA 139 11/08/2013 0805   NA 140 06/25/2012 1651   K 4.0 11/08/2013 0805   K 4.4 06/25/2012 1651   CL 106 06/25/2012 1651   CO2 24 11/08/2013 0805   CO2 27 06/25/2012 1651   BUN 25.7 11/08/2013 0805   BUN 15 09/10/2012 1638   CREATININE 1.1 11/08/2013 0805   CREATININE 0.9 09/10/2012 1638   CREATININE 0.86 05/12/2006 1511      Component Value Date/Time   CALCIUM 9.0 11/08/2013 0805   CALCIUM 9.2 06/25/2012 1651   ALKPHOS 76 11/08/2013 0805   ALKPHOS 83 06/25/2012 1651   AST 13 11/08/2013 0805   AST 13 06/25/2012 1651   ALT 18 11/08/2013 0805   ALT 11 06/25/2012 1651   BILITOT 0.37  11/08/2013 0805   BILITOT 0.4 06/25/2012 1651      ASSESSMENT & PLAN:  Follicular lymphoma She tolerated her first dose rituximab well. I will recommend her to continue 4 doses total along with small dose of daily prednisone. She is not receiving aggressive chemotherapy treatment due to her other comorbidities and refusal for aggressive treatment. I plan to order a PET CT scan at the end of next month  and see her back to assess response to treatment. Clinically, she appears to be improving.    TOBACCO DEPENDENCE I spent some time counseling the patient the importance of tobacco cessation. She is currently not interested to quit now.    Angina effort With her chronic smoking history, there is no doubt she'll probably have some coronary artery disease. She is not interested to quit smoking. In the meantime, continue risk factor modification. She is on oral antiplatelet agents and Zocor.   Orders Placed This Encounter  Procedures  . NM PET Image Restag (PS) Skull Base To Thigh    Standing Status: Future     Number of Occurrences:      Standing Expiration Date: 01/08/2015    Order Specific Question:  Reason for Exam (SYMPTOM  OR DIAGNOSIS REQUIRED)    Answer:  staging lymphoma, assess response to Rx    Order Specific Question:  Preferred imaging location?    Answer:  [facility]   All questions were answered. The patient knows to call the clinic with any problems, questions or concerns. No barriers to learning was detected. I spent 25 minutes counseling the patient face to face. The total time spent in the appointment was 30 minutes and more than 50% was on counseling and review of test results     [facility], [place], MD 11/08/2013 9:04 AM

## 2013-11-08 NOTE — Assessment & Plan Note (Signed)
I spent some time counseling the patient the importance of tobacco cessation. She is currently not interested to quit now. 

## 2013-11-08 NOTE — Assessment & Plan Note (Signed)
With her chronic smoking history, there is no doubt she'll probably have some coronary artery disease. She is not interested to quit smoking. In the meantime, continue risk factor modification. She is on oral antiplatelet agents and Zocor.

## 2013-11-08 NOTE — Telephone Encounter (Signed)
gv and printed appt sched and avs for pt for Dec °

## 2013-11-08 NOTE — Assessment & Plan Note (Signed)
She tolerated her first dose rituximab well. I will recommend her to continue 4 doses total along with small dose of daily prednisone. She is not receiving aggressive chemotherapy treatment due to her other comorbidities and refusal for aggressive treatment. I plan to order a PET CT scan at the end of next month and see her back to assess response to treatment. Clinically, she appears to be improving.

## 2013-12-07 ENCOUNTER — Other Ambulatory Visit: Payer: Self-pay | Admitting: Hematology and Oncology

## 2013-12-23 ENCOUNTER — Ambulatory Visit (HOSPITAL_COMMUNITY)
Admission: RE | Admit: 2013-12-23 | Discharge: 2013-12-23 | Disposition: A | Discharge status: Home / Self Care | Payer: Commercial Managed Care - HMO | Source: Ambulatory Visit | Attending: Hematology and Oncology | Admitting: Hematology and Oncology

## 2013-12-23 DIAGNOSIS — I700 Atherosclerosis of aorta: Secondary | ICD-10-CM | POA: Diagnosis not present

## 2013-12-23 DIAGNOSIS — C8290 Follicular lymphoma, unspecified, unspecified site: Secondary | ICD-10-CM

## 2013-12-23 DIAGNOSIS — C8590 Non-Hodgkin lymphoma, unspecified, unspecified site: Secondary | ICD-10-CM | POA: Insufficient documentation

## 2013-12-23 DIAGNOSIS — R599 Enlarged lymph nodes, unspecified: Secondary | ICD-10-CM | POA: Insufficient documentation

## 2013-12-23 DIAGNOSIS — I2510 Atherosclerotic heart disease of native coronary artery without angina pectoris: Secondary | ICD-10-CM | POA: Insufficient documentation

## 2013-12-23 LAB — GLUCOSE, CAPILLARY: Glucose-Capillary: 95 mg/dL (ref 70–99)

## 2013-12-23 MED ORDER — FLUDEOXYGLUCOSE F - 18 (FDG) INJECTION
6.7500 | Freq: Once | INTRAVENOUS | Status: AC | PRN
  Administered 2013-12-23: 6.75 via INTRAVENOUS

## 2013-12-26 ENCOUNTER — Ambulatory Visit (HOSPITAL_BASED_OUTPATIENT_CLINIC_OR_DEPARTMENT_OTHER): Payer: Commercial Managed Care - HMO | Admitting: Hematology and Oncology

## 2013-12-26 VITALS — BP 113/56 | HR 101 | Temp 98.1°F | Resp 18 | Ht 62.0 in | Wt 145.7 lb

## 2013-12-26 DIAGNOSIS — C8290 Follicular lymphoma, unspecified, unspecified site: Secondary | ICD-10-CM

## 2013-12-26 DIAGNOSIS — I2510 Atherosclerotic heart disease of native coronary artery without angina pectoris: Secondary | ICD-10-CM

## 2013-12-26 DIAGNOSIS — F17200 Nicotine dependence, unspecified, uncomplicated: Secondary | ICD-10-CM

## 2013-12-26 DIAGNOSIS — Z720 Tobacco use: Secondary | ICD-10-CM | POA: Diagnosis not present

## 2013-12-26 MED ORDER — AMOXICILLIN 500 MG PO TABS: 500.0000 mg | ORAL_TABLET | Freq: Two times a day (BID) | ORAL | Status: DC

## 2013-12-26 NOTE — Assessment & Plan Note (Signed)
We discussed the role of maintenance rituximab. The patient has poor hearing and is somewhat reluctant to give me an answer. Her daughter would like to discuss this further with other family members to come up with the plan. If she declines rituximab maintenance therapy, I will see her every 6 months with history, physical blood work and imaging study.

## 2013-12-26 NOTE — Assessment & Plan Note (Signed)
The patient is currently on maximal medical management. She is advised to quit smoking.

## 2013-12-26 NOTE — Progress Notes (Signed)
[place] OFFICE PROGRESS NOTE  Patient Care Team: Birdie Riddle, MD as PCP - General (Internal Medicine)  SUMMARY OF ONCOLOGIC HISTORY: Oncology History   Follicular lymphoma, FLIPI score of 3 due to age >44, extranodal disease >4 and stage IV at presentation   Primary site: Lymphoid Neoplasms   Staging method: AJCC 6th Edition   Clinical: Stage IV signed by Heath Lark, MD on 10/07/2013  3:59 PM   Summary: Stage IV       Follicular lymphoma   08/05/4578 Imaging PET/CT scan showed diffuse lymphadenopathy and splenomegaly.   09/27/2013 Bone Marrow Biopsy Bone marrow biopsy show lymphoma involvement most consistent with follicular lymphoma.   10/18/2013 - 11/08/2013 Chemotherapy She is given rituximab with prednisone   12/23/2013 Imaging Repeat PET/CT scan showed near-complete response.    INTERVAL HISTORY: Please see below for problem oriented charting. She returns to review test results. She is doing well. Denies new lymphadenopathy. She has gained weight and is eating better.  REVIEW OF SYSTEMS:   Constitutional: Denies fevers, chills or abnormal weight loss Eyes: Denies blurriness of vision Ears, nose, mouth, throat, and face: Denies mucositis or sore throat Respiratory: Denies cough, dyspnea or wheezes Cardiovascular: Denies palpitation, chest discomfort or lower extremity swelling Gastrointestinal:  Denies nausea, heartburn or change in bowel habits Skin: Denies abnormal skin rashes Lymphatics: Denies new lymphadenopathy or easy bruising Neurological:Denies numbness, tingling or new weaknesses Behavioral/Psych: Mood is stable, no new changes  All other systems were reviewed with the patient and are negative.  I have reviewed the past medical history, past surgical history, social history and family history with the patient and they are unchanged from previous note.  ALLERGIES:  has No Known Allergies.  MEDICATIONS:  Current Outpatient Prescriptions   Medication Sig Dispense Refill  . allopurinol (ZYLOPRIM) 300 MG tablet TAKE 1 TABLET BY MOUTH DAILY 90 tablet 0  . aspirin 81 MG tablet Take 81 mg by mouth daily.      [name] atorvastatin (LIPITOR) 20 MG tablet Take 20 mg by mouth daily.    . clopidogrel (PLAVIX) 75 MG tablet Take 75 mg by mouth daily.    . famotidine (PEPCID) 20 MG tablet Take 20 mg by mouth 2 (two) times daily.    [name] HYDROcodone-acetaminophen (NORCO) 10-325 MG per tablet Take 1-2 tablets by mouth every 4 (four) hours as needed for pain.     [name] LORazepam (ATIVAN) 2 MG tablet Take 2 mg by mouth 3 (three) times daily as needed for anxiety.    [name] losartan (COZAAR) 100 MG tablet Take 100 mg by mouth 2 (two) times daily.     . megestrol (MEGACE) 40 MG/ML suspension Take 200 mg by mouth 2 (two) times a week.     . nitroGLYCERIN (NITROLINGUAL) 0.4 MG/SPRAY spray Place 1 spray under the tongue every 5 (five) minutes x 3 doses as needed for chest pain.    [name] ondansetron (ZOFRAN) 8 MG tablet Take 1 tablet (8 mg total) by mouth every 8 (eight) hours as needed for nausea. 30 tablet 3  . oxybutynin (DITROPAN-XL) 5 MG 24 hr tablet Take 5 mg by mouth daily.     . polyethylene glycol powder (GLYCOLAX/MIRALAX) powder     . predniSONE (DELTASONE) 20 MG tablet Take 1 tablet (20 mg total) by mouth daily with breakfast. 30 tablet 0  . amoxicillin (AMOXIL) 500 MG tablet Take 1 tablet (500 mg total) by mouth 2 (two) times daily. 14 tablet 0  No current facility-administered medications for this visit.    PHYSICAL EXAMINATION: ECOG PERFORMANCE STATUS: 0 - Asymptomatic  Filed Vitals:   12/26/13 1525  BP: 113/56  Pulse: 101  Temp: 98.1 F (36.7 C)  Resp: 18   Filed Weights   12/26/13 1525  Weight: 145 lb 11.2 oz (66.089 kg)    GENERAL:alert, no distress and comfortable SKIN: skin color, texture, turgor are normal, no rashes or significant lesions EYES: normal, Conjunctiva are pink and non-injected, sclera clear OROPHARYNX:no exudate, no  erythema and lips, buccal mucosa, and tongue normal  NECK: supple, thyroid normal size, non-tender, without nodularity LYMPH:  no palpable lymphadenopathy in the cervical, axillary or inguinal LUNGS: clear to auscultation and percussion with normal breathing effort HEART: regular rate & rhythm and no murmurs and no lower extremity edema ABDOMEN:abdomen soft, non-tender and normal bowel sounds Musculoskeletal:no cyanosis of digits and no clubbing  NEURO: alert & oriented x 3 with fluent speech, no focal motor/sensory deficits  LABORATORY DATA:  I have reviewed the data as listed    Component Value Date/Time   NA 139 11/08/2013 0805   NA 140 06/25/2012 1651   K 4.0 11/08/2013 0805   K 4.4 06/25/2012 1651   CL 106 06/25/2012 1651   CO2 24 11/08/2013 0805   CO2 27 06/25/2012 1651   GLUCOSE 101 11/08/2013 0805   GLUCOSE 112* 06/25/2012 1651   BUN 25.7 11/08/2013 0805   BUN 15 09/10/2012 1638   CREATININE 1.1 11/08/2013 0805   CREATININE 0.9 09/10/2012 1638   CREATININE 0.86 05/12/2006 1511   CALCIUM 9.0 11/08/2013 0805   CALCIUM 9.2 06/25/2012 1651   PROT 6.6 11/08/2013 0805   PROT 7.4 06/25/2012 1651   ALBUMIN 3.4* 11/08/2013 0805   ALBUMIN 3.7 06/25/2012 1651   AST 13 11/08/2013 0805   AST 13 06/25/2012 1651   ALT 18 11/08/2013 0805   ALT 11 06/25/2012 1651   ALKPHOS 76 11/08/2013 0805   ALKPHOS 83 06/25/2012 1651   BILITOT 0.37 11/08/2013 0805   BILITOT 0.4 06/25/2012 1651   GFRNONAA 83* 06/25/2012 1651   GFRAA >90 06/25/2012 1651    No results found for: SPEP, UPEP  Lab Results  Component Value Date   WBC 6.2 11/08/2013   NEUTROABS 3.8 11/08/2013   HGB 11.9 11/08/2013   HCT 36.0 11/08/2013   MCV 97.8 11/08/2013   PLT 215 11/08/2013      Chemistry      Component Value Date/Time   NA 139 11/08/2013 0805   NA 140 06/25/2012 1651   K 4.0 11/08/2013 0805   K 4.4 06/25/2012 1651   CL 106 06/25/2012 1651   CO2 24 11/08/2013 0805   CO2 27 06/25/2012 1651    BUN 25.7 11/08/2013 0805   BUN 15 09/10/2012 1638   CREATININE 1.1 11/08/2013 0805   CREATININE 0.9 09/10/2012 1638   CREATININE 0.86 05/12/2006 1511      Component Value Date/Time   CALCIUM 9.0 11/08/2013 0805   CALCIUM 9.2 06/25/2012 1651   ALKPHOS 76 11/08/2013 0805   ALKPHOS 83 06/25/2012 1651   AST 13 11/08/2013 0805   AST 13 06/25/2012 1651   ALT 18 11/08/2013 0805   ALT 11 06/25/2012 1651   BILITOT 0.37 11/08/2013 0805   BILITOT 0.4 06/25/2012 1651       RADIOGRAPHIC STUDIES: I reviewed the PET CT scan with the patient and her daughter I have personally reviewed the radiological images as listed and agreed with the findings  in the report.  ASSESSMENT & PLAN:  Follicular lymphoma We discussed the role of maintenance rituximab. The patient has poor hearing and is somewhat reluctant to give me an answer. Her daughter would like to discuss this further with other family members to come up with the plan. If she declines rituximab maintenance therapy, I will see her every 6 months with history, physical blood work and imaging study.  TOBACCO DEPENDENCE I spent some time counseling the patient the importance of tobacco cessation. she is currently attempting to quit on her own  Coronary artery calcification seen on CAT scan The patient is currently on maximal medical management. She is advised to quit smoking.   No orders of the defined types were placed in this encounter.   All questions were answered. The patient knows to call the clinic with any problems, questions or concerns. No barriers to learning was detected. I spent 30 minutes counseling the patient face to face. The total time spent in the appointment was 40 minutes and more than 50% was on counseling and review of test results     [facility], [place], MD 12/26/2013 8:27 PM

## 2013-12-26 NOTE — Assessment & Plan Note (Signed)
I spent some time counseling the patient the importance of tobacco cessation. she is currently attempting to quit on her own 

## 2014-01-02 ENCOUNTER — Other Ambulatory Visit: Payer: Self-pay | Admitting: Hematology and Oncology

## 2014-01-10 ENCOUNTER — Other Ambulatory Visit: Payer: Self-pay | Admitting: Internal Medicine

## 2014-02-03 DIAGNOSIS — M4806 Spinal stenosis, lumbar region: Secondary | ICD-10-CM | POA: Diagnosis not present

## 2014-02-03 DIAGNOSIS — Z79891 Long term (current) use of opiate analgesic: Secondary | ICD-10-CM | POA: Diagnosis not present

## 2014-02-03 DIAGNOSIS — M5136 Other intervertebral disc degeneration, lumbar region: Secondary | ICD-10-CM | POA: Diagnosis not present

## 2014-02-25 ENCOUNTER — Ambulatory Visit: Payer: Medicare HMO | Admitting: Family

## 2014-02-27 ENCOUNTER — Telehealth: Payer: Self-pay | Admitting: Hematology and Oncology

## 2014-02-27 ENCOUNTER — Telehealth: Payer: Self-pay | Admitting: *Deleted

## 2014-02-27 NOTE — Telephone Encounter (Signed)
FOR A FEW DAYS A HEADACHE HAS AWAKEN PT. IT IS BELOW HER LEFT EAR, UP TO LEFT TEMPLE AND AROUND LEFT EYE. AFTER THE PT.GETS UP THE HEADACHE GOES AWAY BUT HER FOREHEAD OVER LEFT EYE IS SORE. PT. HAS NO ENERGY AND NO APPETITE. THESE SYMPTOMS HAPPEN ONCE OR TWICE A WEEK. PT. HAS AN APPOINTMENT WITH A NEW PRIMARY CARE PHYSICIAN BUT IS UNSURE WHEN AND WOULD LIKE TO SEE [place]. INFORMED PT. [place] WILL BE NOTIFIED AND TO EXPECT A RETURN CALL TOMORROW BUT IF HER SITUATION WORSENS SHE NEEDS TO GO TO THE EMERGENCY DEPARTMENT. PT. VOICES UNDERSTANDING.

## 2014-02-27 NOTE — Telephone Encounter (Signed)
returned call and s.w. pt and she need triage transfered to neilsa.

## 2014-02-28 ENCOUNTER — Telehealth: Payer: Self-pay | Admitting: Hematology and Oncology

## 2014-02-28 ENCOUNTER — Other Ambulatory Visit: Payer: Self-pay | Admitting: Hematology and Oncology

## 2014-02-28 ENCOUNTER — Ambulatory Visit (HOSPITAL_BASED_OUTPATIENT_CLINIC_OR_DEPARTMENT_OTHER): Payer: Medicare Other

## 2014-02-28 ENCOUNTER — Encounter: Payer: Self-pay | Admitting: Hematology and Oncology

## 2014-02-28 ENCOUNTER — Ambulatory Visit (HOSPITAL_BASED_OUTPATIENT_CLINIC_OR_DEPARTMENT_OTHER): Payer: Medicare Other | Admitting: Hematology and Oncology

## 2014-02-28 VITALS — BP 140/62 | HR 97 | Temp 98.0°F | Resp 18 | Ht 62.0 in | Wt 139.3 lb

## 2014-02-28 DIAGNOSIS — G4700 Insomnia, unspecified: Secondary | ICD-10-CM | POA: Diagnosis not present

## 2014-02-28 DIAGNOSIS — F17200 Nicotine dependence, unspecified, uncomplicated: Secondary | ICD-10-CM

## 2014-02-28 DIAGNOSIS — R1012 Left upper quadrant pain: Secondary | ICD-10-CM | POA: Diagnosis not present

## 2014-02-28 DIAGNOSIS — F329 Major depressive disorder, single episode, unspecified: Secondary | ICD-10-CM | POA: Diagnosis not present

## 2014-02-28 DIAGNOSIS — F32A Depression, unspecified: Secondary | ICD-10-CM

## 2014-02-28 DIAGNOSIS — R51 Headache: Secondary | ICD-10-CM | POA: Diagnosis not present

## 2014-02-28 DIAGNOSIS — C8290 Follicular lymphoma, unspecified, unspecified site: Secondary | ICD-10-CM | POA: Diagnosis not present

## 2014-02-28 DIAGNOSIS — R630 Anorexia: Secondary | ICD-10-CM

## 2014-02-28 DIAGNOSIS — R519 Headache, unspecified: Secondary | ICD-10-CM

## 2014-02-28 HISTORY — DX: Insomnia, unspecified: G47.00

## 2014-02-28 LAB — COMPREHENSIVE METABOLIC PANEL (CC13)
ALK PHOS: 100 U/L (ref 40–150)
ALT: 15 U/L (ref 0–55)
ANION GAP: 9 meq/L (ref 3–11)
AST: 17 U/L (ref 5–34)
Albumin: 3.6 g/dL (ref 3.5–5.0)
BILIRUBIN TOTAL: 0.63 mg/dL (ref 0.20–1.20)
BUN: 10.2 mg/dL (ref 7.0–26.0)
CALCIUM: 8.8 mg/dL (ref 8.4–10.4)
CO2: 24 meq/L (ref 22–29)
Chloride: 110 mEq/L — ABNORMAL HIGH (ref 98–109)
Creatinine: 0.8 mg/dL (ref 0.6–1.1)
EGFR: 84 mL/min/{1.73_m2} — ABNORMAL LOW (ref >90)
Glucose: 108 mg/dl (ref 70–140)
Potassium: 3.6 mEq/L (ref 3.5–5.1)
Sodium: 144 mEq/L (ref 136–145)
TOTAL PROTEIN: 6.7 g/dL (ref 6.4–8.3)

## 2014-02-28 LAB — CBC WITH DIFFERENTIAL/PLATELET
BASO%: 0.5 % (ref 0.0–2.0)
Basophils Absolute: 0 10*3/uL (ref 0.0–0.1)
EOS%: 4.6 % (ref 0.0–7.0)
Eosinophils Absolute: 0.2 10*3/uL (ref 0.0–0.5)
HCT: 36.5 % (ref 34.8–46.6)
HEMOGLOBIN: 11.8 g/dL (ref 11.6–15.9)
LYMPH%: 36.9 % (ref 14.0–49.7)
MCH: 30.3 pg (ref 25.1–34.0)
MCHC: 32.3 g/dL (ref 31.5–36.0)
MCV: 93.8 fL (ref 79.5–101.0)
MONO#: 0.4 10*3/uL (ref 0.1–0.9)
MONO%: 10.7 % (ref 0.0–14.0)
NEUT#: 1.7 10*3/uL (ref 1.5–6.5)
NEUT%: 47.3 % (ref 38.4–76.8)
PLATELETS: 157 10*3/uL (ref 145–400)
RBC: 3.89 10*6/uL (ref 3.70–5.45)
RDW: 14.2 % (ref 11.2–14.5)
WBC: 3.7 10*3/uL — AB (ref 3.9–10.3)
lymph#: 1.4 10*3/uL (ref 0.9–3.3)

## 2014-02-28 LAB — LACTATE DEHYDROGENASE (CC13): LDH: 203 U/L (ref 125–245)

## 2014-02-28 MED ORDER — MIRTAZAPINE 15 MG PO TABS: 15.0000 mg | ORAL_TABLET | Freq: Every day | ORAL | Status: DC

## 2014-02-28 NOTE — Assessment & Plan Note (Signed)
I spent some time counseling the patient the importance of tobacco cessation. she is currently attempting to quit on her own 

## 2014-02-28 NOTE — Assessment & Plan Note (Signed)
She has clinical signs of depression, anorexia, and poor sleep. I recommend a trial of Remeron.

## 2014-02-28 NOTE — Telephone Encounter (Signed)
I placed POF for lab orders and appt to see me today

## 2014-02-28 NOTE — Assessment & Plan Note (Signed)
This is chronic in nature. Likely related to poor sleep. CT scan of the head done last year showed no evidence of intracranial malignancy. She is reassured. Hopefully, with the trial Remeron, if she sleeps better, her headache will go away.

## 2014-02-28 NOTE — Telephone Encounter (Signed)
Pt confirmed MD visit per 02/05 POF, gave pt AVS..... KJ, sent msg to MD/AM to add MD visit at 3:30 due to needing override

## 2014-02-28 NOTE — Telephone Encounter (Signed)
s.w. pt and advised on Todays appts.Marland KitchenMarland KitchenMarland KitchenMarland Kitchenpt ok and aware

## 2014-02-28 NOTE — Assessment & Plan Note (Signed)
The site of pain is related to prior surgical scar. She has no palpable splenomegaly. The patient is not reassured. I recommend PET CT scan for further evaluation to exclude recurrence of lymphoma and splenomegaly and she agreed to proceed.

## 2014-02-28 NOTE — Progress Notes (Signed)
[place] OFFICE PROGRESS NOTE  Patient Care Team: Birdie Riddle, MD as PCP - General (Internal Medicine)  SUMMARY OF ONCOLOGIC HISTORY: Oncology History   Follicular lymphoma, FLIPI score of 3 due to age >82, extranodal disease >4 and stage IV at presentation   Primary site: Lymphoid Neoplasms   Staging method: AJCC 6th Edition   Clinical: Stage IV signed by Heath Lark, MD on 10/07/2013  3:59 PM   Summary: Stage IV       Follicular lymphoma   10/22/2444 Imaging PET/CT scan showed diffuse lymphadenopathy and splenomegaly.   09/27/2013 Bone Marrow Biopsy Bone marrow biopsy show lymphoma involvement most consistent with follicular lymphoma.   10/18/2013 - 11/08/2013 Chemotherapy She is given rituximab with prednisone   12/23/2013 Imaging Repeat PET/CT scan showed near-complete response.    INTERVAL HISTORY: Please see below for problem oriented charting. She is seen urgently because she does not feel well. She complained of diffuse body aches. She have intractable headaches and poor sleep. She has poor appetite and depressed mood. She denies new lymphadenopathy. She complained of severe pain in the epigastric/left upper quadrant area.  REVIEW OF SYSTEMS:   Constitutional: Denies fevers, chills or abnormal weight loss Eyes: Denies blurriness of vision Ears, nose, mouth, throat, and face: Denies mucositis or sore throat Respiratory: Denies cough, dyspnea or wheezes Cardiovascular: Denies palpitation, chest discomfort or lower extremity swelling Gastrointestinal:  Denies nausea, heartburn or change in bowel habits Skin: Denies abnormal skin rashes Lymphatics: Denies new lymphadenopathy or easy bruising Neurological:Denies numbness, tingling or new weaknesses All other systems were reviewed with the patient and are negative.  I have reviewed the past medical history, past surgical history, social history and family history with the patient and they are unchanged from  previous note.  ALLERGIES:  has No Known Allergies.  MEDICATIONS:  Current Outpatient Prescriptions  Medication Sig Dispense Refill  . aspirin 81 MG tablet Take 81 mg by mouth daily.      [name] atorvastatin (LIPITOR) 20 MG tablet Take 20 mg by mouth daily.    . clopidogrel (PLAVIX) 75 MG tablet Take 75 mg by mouth daily.    . famotidine (PEPCID) 20 MG tablet Take 20 mg by mouth 2 (two) times daily.    [name] HYDROcodone-acetaminophen (NORCO) 10-325 MG per tablet Take 1-2 tablets by mouth every 4 (four) hours as needed for pain.     [name] LORazepam (ATIVAN) 2 MG tablet TAKE 1 TABLET BY MOUTH THREE TIMES DAILY 90 tablet 1  . losartan (COZAAR) 100 MG tablet Take 100 mg by mouth 2 (two) times daily.     . megestrol (MEGACE) 40 MG/ML suspension Take 200 mg by mouth 2 (two) times a week.     . nitroGLYCERIN (NITROLINGUAL) 0.4 MG/SPRAY spray Place 1 spray under the tongue every 5 (five) minutes x 3 doses as needed for chest pain.    [name] oxybutynin (DITROPAN-XL) 5 MG 24 hr tablet Take 5 mg by mouth daily.     . polyethylene glycol powder (GLYCOLAX/MIRALAX) powder     . mirtazapine (REMERON) 15 MG tablet Take 1 tablet (15 mg total) by mouth at bedtime. 30 tablet 0  . ondansetron (ZOFRAN) 8 MG tablet Take 1 tablet (8 mg total) by mouth every 8 (eight) hours as needed for nausea. (Patient not taking: Reported on 02/28/2014) 30 tablet 3  . potassium chloride (K-DUR) 10 MEQ tablet   2   No current facility-administered medications for this visit.  PHYSICAL EXAMINATION: ECOG PERFORMANCE STATUS: 1 - Symptomatic but completely ambulatory  Filed Vitals:   02/28/14 1430  BP: 140/62  Pulse: 97  Temp: 98 F (36.7 C)  Resp: 18   Filed Weights   02/28/14 1430  Weight: 139 lb 4.8 oz (63.186 kg)    GENERAL:alert, no distress and comfortable SKIN: skin color, texture, turgor are normal, no rashes or significant lesions EYES: normal, Conjunctiva are pink and non-injected, sclera clear OROPHARYNX:no exudate, no  erythema and lips, buccal mucosa, and tongue normal  NECK: supple, thyroid normal size, non-tender, without nodularity LYMPH:  no palpable lymphadenopathy in the cervical, axillary or inguinal LUNGS: clear to auscultation and percussion with normal breathing effort HEART: regular rate & rhythm and no murmurs and no lower extremity edema ABDOMEN:abdomen soft, non-tender and normal bowel sounds. Noted well-healed surgical scar. No splenomegaly. Musculoskeletal:no cyanosis of digits and no clubbing  NEURO: alert & oriented x 3 with fluent speech, no focal motor/sensory deficits  LABORATORY DATA:  I have reviewed the data as listed    Component Value Date/Time   NA 144 02/28/2014 1419   NA 140 06/25/2012 1651   K 3.6 02/28/2014 1419   K 4.4 06/25/2012 1651   CL 106 06/25/2012 1651   CO2 24 02/28/2014 1419   CO2 27 06/25/2012 1651   GLUCOSE 108 02/28/2014 1419   GLUCOSE 112* 06/25/2012 1651   BUN 10.2 02/28/2014 1419   BUN 15 09/10/2012 1638   CREATININE 0.8 02/28/2014 1419   CREATININE 0.9 09/10/2012 1638   CREATININE 0.86 05/12/2006 1511   CALCIUM 8.8 02/28/2014 1419   CALCIUM 9.2 06/25/2012 1651   PROT 6.7 02/28/2014 1419   PROT 7.4 06/25/2012 1651   ALBUMIN 3.6 02/28/2014 1419   ALBUMIN 3.7 06/25/2012 1651   AST 17 02/28/2014 1419   AST 13 06/25/2012 1651   ALT 15 02/28/2014 1419   ALT 11 06/25/2012 1651   ALKPHOS 100 02/28/2014 1419   ALKPHOS 83 06/25/2012 1651   BILITOT 0.63 02/28/2014 1419   BILITOT 0.4 06/25/2012 1651   GFRNONAA 83* 06/25/2012 1651   GFRAA >90 06/25/2012 1651    No results found for: SPEP, UPEP  Lab Results  Component Value Date   WBC 3.7* 02/28/2014   NEUTROABS 1.7 02/28/2014   HGB 11.8 02/28/2014   HCT 36.5 02/28/2014   MCV 93.8 02/28/2014   PLT 157 02/28/2014      Chemistry      Component Value Date/Time   NA 144 02/28/2014 1419   NA 140 06/25/2012 1651   K 3.6 02/28/2014 1419   K 4.4 06/25/2012 1651   CL 106 06/25/2012 1651    CO2 24 02/28/2014 1419   CO2 27 06/25/2012 1651   BUN 10.2 02/28/2014 1419   BUN 15 09/10/2012 1638   CREATININE 0.8 02/28/2014 1419   CREATININE 0.9 09/10/2012 1638   CREATININE 0.86 05/12/2006 1511      Component Value Date/Time   CALCIUM 8.8 02/28/2014 1419   CALCIUM 9.2 06/25/2012 1651   ALKPHOS 100 02/28/2014 1419   ALKPHOS 83 06/25/2012 1651   AST 17 02/28/2014 1419   AST 13 06/25/2012 1651   ALT 15 02/28/2014 1419   ALT 11 06/25/2012 1651   BILITOT 0.63 02/28/2014 1419   BILITOT 0.4 06/25/2012 1651     ASSESSMENT & PLAN:  Follicular lymphoma We discussed the role of maintenance rituximab. She does not want any maintenance treatment. She has concern about pain in the abdomen and a palpable  mass. I recommend PET/CT scan for further evaluation.   TOBACCO DEPENDENCE I spent some time counseling the patient the importance of tobacco cessation. she is currently attempting to quit on her own   Left upper quadrant pain The site of pain is related to prior surgical scar. She has no palpable splenomegaly. The patient is not reassured. I recommend PET CT scan for further evaluation to exclude recurrence of lymphoma and splenomegaly and she agreed to proceed.   Depression She has clinical signs of depression, anorexia, and poor sleep. I recommend a trial of Remeron.   Insomnia She has very poor sleep hygiene. I recommend a trial of Remeron.   Headache This is chronic in nature. Likely related to poor sleep. CT scan of the head done last year showed no evidence of intracranial malignancy. She is reassured. Hopefully, with the trial Remeron, if she sleeps better, her headache will go away.    Orders Placed This Encounter  Procedures  . NM PET Image Restag (PS) Skull Base To Thigh    Standing Status: Future     Number of Occurrences:      Standing Expiration Date: 04/30/2015    Order Specific Question:  Reason for Exam (SYMPTOM  OR DIAGNOSIS REQUIRED)    Answer:   staging lymphoma, exclude residual disease. Palpable mass left upper duqdrant    Order Specific Question:  Preferred imaging location?    Answer:  [facility]   All questions were answered. The patient knows to call the clinic with any problems, questions or concerns. No barriers to learning was detected. I spent 30 minutes counseling the patient face to face. The total time spent in the appointment was 40 minutes and more than 50% was on counseling and review of test results     [facility], [place], MD 02/28/2014 3:51 PM

## 2014-02-28 NOTE — Assessment & Plan Note (Signed)
We discussed the role of maintenance rituximab. She does not want any maintenance treatment. She has concern about pain in the abdomen and a palpable mass. I recommend PET/CT scan for further evaluation.

## 2014-02-28 NOTE — Assessment & Plan Note (Signed)
She has very poor sleep hygiene. I recommend a trial of Remeron.

## 2014-03-11 ENCOUNTER — Encounter: Payer: Self-pay | Admitting: Internal Medicine

## 2014-03-24 ENCOUNTER — Ambulatory Visit (HOSPITAL_COMMUNITY)
Admission: RE | Admit: 2014-03-24 | Discharge: 2014-03-24 | Disposition: A | Discharge status: Home / Self Care | Payer: Medicare Other | Source: Ambulatory Visit | Attending: Hematology and Oncology | Admitting: Hematology and Oncology

## 2014-03-24 ENCOUNTER — Encounter (HOSPITAL_COMMUNITY): Payer: Self-pay

## 2014-03-24 DIAGNOSIS — C8290 Follicular lymphoma, unspecified, unspecified site: Secondary | ICD-10-CM | POA: Diagnosis not present

## 2014-03-24 DIAGNOSIS — R591 Generalized enlarged lymph nodes: Secondary | ICD-10-CM | POA: Diagnosis not present

## 2014-03-24 DIAGNOSIS — Z9221 Personal history of antineoplastic chemotherapy: Secondary | ICD-10-CM | POA: Diagnosis not present

## 2014-03-24 DIAGNOSIS — K868 Other specified diseases of pancreas: Secondary | ICD-10-CM | POA: Diagnosis not present

## 2014-03-24 DIAGNOSIS — C8590 Non-Hodgkin lymphoma, unspecified, unspecified site: Secondary | ICD-10-CM | POA: Diagnosis not present

## 2014-03-24 DIAGNOSIS — N281 Cyst of kidney, acquired: Secondary | ICD-10-CM | POA: Diagnosis not present

## 2014-03-24 LAB — GLUCOSE, CAPILLARY: GLUCOSE-CAPILLARY: 88 mg/dL (ref 70–99)

## 2014-03-24 MED ORDER — FLUDEOXYGLUCOSE F - 18 (FDG) INJECTION
6.8100 | Freq: Once | INTRAVENOUS | Status: AC | PRN
  Administered 2014-03-24: 6.81 via INTRAVENOUS

## 2014-03-25 ENCOUNTER — Encounter: Payer: Self-pay | Admitting: Hematology and Oncology

## 2014-03-25 ENCOUNTER — Ambulatory Visit (HOSPITAL_BASED_OUTPATIENT_CLINIC_OR_DEPARTMENT_OTHER): Payer: Medicare Other | Admitting: Hematology and Oncology

## 2014-03-25 VITALS — BP 138/81 | HR 96 | Temp 98.0°F | Resp 18 | Ht 62.0 in | Wt 145.3 lb

## 2014-03-25 DIAGNOSIS — Z720 Tobacco use: Secondary | ICD-10-CM

## 2014-03-25 DIAGNOSIS — M7918 Myalgia, other site: Secondary | ICD-10-CM

## 2014-03-25 DIAGNOSIS — G8929 Other chronic pain: Secondary | ICD-10-CM

## 2014-03-25 DIAGNOSIS — M791 Myalgia: Secondary | ICD-10-CM | POA: Diagnosis not present

## 2014-03-25 DIAGNOSIS — F17200 Nicotine dependence, unspecified, uncomplicated: Secondary | ICD-10-CM

## 2014-03-25 DIAGNOSIS — C8290 Follicular lymphoma, unspecified, unspecified site: Secondary | ICD-10-CM

## 2014-03-26 ENCOUNTER — Telehealth: Payer: Self-pay | Admitting: Hematology and Oncology

## 2014-03-26 DIAGNOSIS — M7918 Myalgia, other site: Secondary | ICD-10-CM | POA: Insufficient documentation

## 2014-03-26 DIAGNOSIS — G8929 Other chronic pain: Secondary | ICD-10-CM | POA: Insufficient documentation

## 2014-03-26 NOTE — Progress Notes (Signed)
[place] OFFICE PROGRESS NOTE  Patient Care Team: Birdie Riddle, MD as PCP - General (Internal Medicine)  SUMMARY OF ONCOLOGIC HISTORY: Oncology History   Follicular lymphoma, FLIPI score of 3 due to age >20, extranodal disease >4 and stage IV at presentation   Primary site: Lymphoid Neoplasms   Staging method: AJCC 6th Edition   Clinical: Stage IV signed by Heath Lark, MD on 10/07/2013  3:59 PM   Summary: Stage IV       Follicular lymphoma   2/87/6811 Imaging PET/CT scan showed diffuse lymphadenopathy and splenomegaly.   09/27/2013 Bone Marrow Biopsy Bone marrow biopsy show lymphoma involvement most consistent with follicular lymphoma.   10/18/2013 - 11/08/2013 Chemotherapy She is given rituximab with prednisone   12/23/2013 Imaging Repeat PET/CT scan showed near-complete response.   03/24/2014 Imaging Repeat PET CT scan showed no evidence of cancer    INTERVAL HISTORY: Please see below for problem oriented charting. She returns to follow-up on recent PET CT scan report. She continues to intermittent abdominal pain and chronic muscular skeletal pain throughout.  REVIEW OF SYSTEMS:   Constitutional: Denies fevers, chills or abnormal weight loss Eyes: Denies blurriness of vision Ears, nose, mouth, throat, and face: Denies mucositis or sore throat Respiratory: Denies cough, dyspnea or wheezes Cardiovascular: Denies palpitation, chest discomfort or lower extremity swelling Gastrointestinal:  Denies nausea, heartburn or change in bowel habits Skin: Denies abnormal skin rashes Lymphatics: Denies new lymphadenopathy or easy bruising Neurological:Denies numbness, tingling or new weaknesses Behavioral/Psych: Mood is stable, no new changes  All other systems were reviewed with the patient and are negative.  I have reviewed the past medical history, past surgical history, social history and family history with the patient and they are unchanged from previous  note.  ALLERGIES:  has No Known Allergies.  MEDICATIONS:  Current Outpatient Prescriptions  Medication Sig Dispense Refill  . aspirin 81 MG tablet Take 81 mg by mouth daily.      [name] atorvastatin (LIPITOR) 20 MG tablet Take 20 mg by mouth daily.    . clopidogrel (PLAVIX) 75 MG tablet Take 75 mg by mouth daily.    . famotidine (PEPCID) 20 MG tablet Take 20 mg by mouth 2 (two) times daily.    [name] HYDROcodone-acetaminophen (NORCO) 10-325 MG per tablet Take 1-2 tablets by mouth every 4 (four) hours as needed for pain.     [name] LORazepam (ATIVAN) 2 MG tablet TAKE 1 TABLET BY MOUTH THREE TIMES DAILY 90 tablet 1  . losartan (COZAAR) 100 MG tablet Take 100 mg by mouth 2 (two) times daily.     . megestrol (MEGACE) 40 MG/ML suspension Take 200 mg by mouth 2 (two) times a week.     . mirtazapine (REMERON) 15 MG tablet Take 1 tablet (15 mg total) by mouth at bedtime. 30 tablet 0  . nitroGLYCERIN (NITROLINGUAL) 0.4 MG/SPRAY spray Place 1 spray under the tongue every 5 (five) minutes x 3 doses as needed for chest pain.    [name] ondansetron (ZOFRAN) 8 MG tablet Take 1 tablet (8 mg total) by mouth every 8 (eight) hours as needed for nausea. 30 tablet 3  . oxybutynin (DITROPAN-XL) 5 MG 24 hr tablet Take 5 mg by mouth daily.     . polyethylene glycol powder (GLYCOLAX/MIRALAX) powder     . potassium chloride (K-DUR) 10 MEQ tablet   2   No current facility-administered medications for this visit.    PHYSICAL EXAMINATION: ECOG PERFORMANCE STATUS: 1 - Symptomatic  but completely ambulatory  Filed Vitals:   03/25/14 1510  BP: 138/81  Pulse: 96  Temp: 98 F (36.7 C)  Resp: 18   Filed Weights   03/25/14 1510  Weight: 145 lb 4.8 oz (65.908 kg)    GENERAL:alert, no distress and comfortable SKIN: skin color, texture, turgor are normal, no rashes or significant lesions EYES: normal, Conjunctiva are pink and non-injected, sclera clear OROPHARYNX:no exudate, no erythema and lips, buccal mucosa, and tongue normal   NECK: supple, thyroid normal size, non-tender, without nodularity LYMPH:  no palpable lymphadenopathy in the cervical, axillary or inguinal LUNGS: clear to auscultation and percussion with normal breathing effort HEART: regular rate & rhythm and no murmurs and no lower extremity edema ABDOMEN:abdomen soft, non-tender and normal bowel sounds Musculoskeletal:no cyanosis of digits and no clubbing  NEURO: alert & oriented x 3 with fluent speech, no focal motor/sensory deficits  LABORATORY DATA:  I have reviewed the data as listed    Component Value Date/Time   NA 144 02/28/2014 1419   NA 140 06/25/2012 1651   K 3.6 02/28/2014 1419   K 4.4 06/25/2012 1651   CL 106 06/25/2012 1651   CO2 24 02/28/2014 1419   CO2 27 06/25/2012 1651   GLUCOSE 108 02/28/2014 1419   GLUCOSE 112* 06/25/2012 1651   BUN 10.2 02/28/2014 1419   BUN 15 09/10/2012 1638   CREATININE 0.8 02/28/2014 1419   CREATININE 0.9 09/10/2012 1638   CREATININE 0.86 05/12/2006 1511   CALCIUM 8.8 02/28/2014 1419   CALCIUM 9.2 06/25/2012 1651   PROT 6.7 02/28/2014 1419   PROT 7.4 06/25/2012 1651   ALBUMIN 3.6 02/28/2014 1419   ALBUMIN 3.7 06/25/2012 1651   AST 17 02/28/2014 1419   AST 13 06/25/2012 1651   ALT 15 02/28/2014 1419   ALT 11 06/25/2012 1651   ALKPHOS 100 02/28/2014 1419   ALKPHOS 83 06/25/2012 1651   BILITOT 0.63 02/28/2014 1419   BILITOT 0.4 06/25/2012 1651   GFRNONAA 83* 06/25/2012 1651   GFRAA >90 06/25/2012 1651    No results found for: SPEP, UPEP  Lab Results  Component Value Date   WBC 3.7* 02/28/2014   NEUTROABS 1.7 02/28/2014   HGB 11.8 02/28/2014   HCT 36.5 02/28/2014   MCV 93.8 02/28/2014   PLT 157 02/28/2014      Chemistry      Component Value Date/Time   NA 144 02/28/2014 1419   NA 140 06/25/2012 1651   K 3.6 02/28/2014 1419   K 4.4 06/25/2012 1651   CL 106 06/25/2012 1651   CO2 24 02/28/2014 1419   CO2 27 06/25/2012 1651   BUN 10.2 02/28/2014 1419   BUN 15 09/10/2012 1638    CREATININE 0.8 02/28/2014 1419   CREATININE 0.9 09/10/2012 1638   CREATININE 0.86 05/12/2006 1511      Component Value Date/Time   CALCIUM 8.8 02/28/2014 1419   CALCIUM 9.2 06/25/2012 1651   ALKPHOS 100 02/28/2014 1419   ALKPHOS 83 06/25/2012 1651   AST 17 02/28/2014 1419   AST 13 06/25/2012 1651   ALT 15 02/28/2014 1419   ALT 11 06/25/2012 1651   BILITOT 0.63 02/28/2014 1419   BILITOT 0.4 06/25/2012 1651       RADIOGRAPHIC STUDIES: I reviewed the PET CT scan with her and her daughter I have personally reviewed the radiological images as listed and agreed with the findings in the report.   ASSESSMENT & PLAN:  Follicular lymphoma I reassured the patient had  a PET/CT scan was negative. I felt that her intermittent abdominal pain was related to prior surgery. I do not feel that the patient needs to be on chronic pain management. I will see her back in 6 months for repeat history, physical examination and blood work for her lymphoma follow-up.   TOBACCO DEPENDENCE I spent some time counseling the patient the importance of tobacco cessation. she is currently attempting to quit on her own   Chronic musculoskeletal pain This is felt not to be related to her cancer or treatment. I recommend vitamin D supplements that seems to help in some of my patients.    No orders of the defined types were placed in this encounter.   All questions were answered. The patient knows to call the clinic with any problems, questions or concerns. No barriers to learning was detected. I spent 15 minutes counseling the patient face to face. The total time spent in the appointment was 20 minutes and more than 50% was on counseling and review of test results     [facility], [place], MD 03/26/2014 3:52 PM

## 2014-03-26 NOTE — Assessment & Plan Note (Signed)
I spent some time counseling the patient the importance of tobacco cessation. she is currently attempting to quit on her own 

## 2014-03-26 NOTE — Assessment & Plan Note (Signed)
This is felt not to be related to her cancer or treatment. I recommend vitamin D supplements that seems to help in some of my patients.

## 2014-03-26 NOTE — Telephone Encounter (Signed)
s.w. pt son and advised on Aug appt...ok and aware

## 2014-03-26 NOTE — Assessment & Plan Note (Signed)
I reassured the patient had a PET/CT scan was negative. I felt that her intermittent abdominal pain was related to prior surgery. I do not feel that the patient needs to be on chronic pain management. I will see her back in 6 months for repeat history, physical examination and blood work for her lymphoma follow-up.

## 2014-03-27 ENCOUNTER — Other Ambulatory Visit: Payer: Self-pay | Admitting: Hematology and Oncology

## 2014-04-07 ENCOUNTER — Other Ambulatory Visit: Payer: Self-pay | Admitting: Internal Medicine

## 2014-04-08 ENCOUNTER — Telehealth: Payer: Self-pay | Admitting: *Deleted

## 2014-04-08 ENCOUNTER — Other Ambulatory Visit: Payer: Self-pay | Admitting: Internal Medicine

## 2014-04-08 MED ORDER — LORAZEPAM 2 MG PO TABS: 2.0000 mg | ORAL_TABLET | Freq: Three times a day (TID) | ORAL | Status: DC

## 2014-04-08 NOTE — Telephone Encounter (Signed)
Sent Rx for Lorazepam (ATIVAN), 2 mg, #90 with one refill to [facility].

## 2014-04-08 NOTE — Telephone Encounter (Signed)
Original Rx for Lorazepam (ATIVAN) did not print correctly. Re-printed Rx and waiting for Dr. Olevia Perches to sign. Then will fax to [facility] for patient.

## 2014-04-09 NOTE — Telephone Encounter (Signed)
Faxed Rx for Lorazepam (ATIVAN), 2 mg, #90 with one refill to [facility] on 04/09/14.

## 2014-04-22 ENCOUNTER — Telehealth: Payer: Self-pay | Admitting: *Deleted

## 2014-04-22 NOTE — Telephone Encounter (Signed)
Instructed pt to call PCP to evaluate the swelling in her legs.  Informed her of Dr. Calton Dach reply below.  She verbalized understanding.

## 2014-04-22 NOTE — Telephone Encounter (Signed)
Pt reports new swelling in bilat lower legs/feet started about 3 to 4 weeks ago.  Says it was just starting when she saw Dr. Alvy Bimler at the beginning of this month.  Pt says right leg seems a little more swollen than left.  Swelling does go down overnight and gets worse as the day goes on.  She also c/o feeling more "sore all over" than usual.

## 2014-04-22 NOTE — Telephone Encounter (Signed)
She had complete eval recently that was normal. I recommend PCP follow-up. Her symptoms are not likely to be related to cancer

## 2014-05-28 DIAGNOSIS — I10 Essential (primary) hypertension: Secondary | ICD-10-CM | POA: Diagnosis not present

## 2014-05-28 DIAGNOSIS — J449 Chronic obstructive pulmonary disease, unspecified: Secondary | ICD-10-CM | POA: Diagnosis not present

## 2014-05-28 DIAGNOSIS — F411 Generalized anxiety disorder: Secondary | ICD-10-CM | POA: Diagnosis not present

## 2014-05-28 DIAGNOSIS — I2510 Atherosclerotic heart disease of native coronary artery without angina pectoris: Secondary | ICD-10-CM | POA: Diagnosis not present

## 2014-06-02 ENCOUNTER — Emergency Department (HOSPITAL_COMMUNITY)
Admission: EM | Admit: 2014-06-02 | Discharge: 2014-06-02 | Discharge status: Against Medical Advice | Payer: Medicare Other | Attending: Emergency Medicine | Admitting: Emergency Medicine

## 2014-06-02 ENCOUNTER — Encounter (HOSPITAL_COMMUNITY): Payer: Self-pay | Admitting: Emergency Medicine

## 2014-06-02 DIAGNOSIS — Y9389 Activity, other specified: Secondary | ICD-10-CM | POA: Diagnosis not present

## 2014-06-02 DIAGNOSIS — S8990XA Unspecified injury of unspecified lower leg, initial encounter: Secondary | ICD-10-CM | POA: Insufficient documentation

## 2014-06-02 DIAGNOSIS — I10 Essential (primary) hypertension: Secondary | ICD-10-CM | POA: Diagnosis not present

## 2014-06-02 DIAGNOSIS — S3992XA Unspecified injury of lower back, initial encounter: Secondary | ICD-10-CM | POA: Insufficient documentation

## 2014-06-02 DIAGNOSIS — Z720 Tobacco use: Secondary | ICD-10-CM | POA: Insufficient documentation

## 2014-06-02 DIAGNOSIS — Y998 Other external cause status: Secondary | ICD-10-CM | POA: Insufficient documentation

## 2014-06-02 DIAGNOSIS — I2510 Atherosclerotic heart disease of native coronary artery without angina pectoris: Secondary | ICD-10-CM | POA: Diagnosis not present

## 2014-06-02 DIAGNOSIS — W010XXA Fall on same level from slipping, tripping and stumbling without subsequent striking against object, initial encounter: Secondary | ICD-10-CM | POA: Insufficient documentation

## 2014-06-02 DIAGNOSIS — Y9289 Other specified places as the place of occurrence of the external cause: Secondary | ICD-10-CM | POA: Diagnosis not present

## 2014-06-02 NOTE — ED Notes (Signed)
Pt c/o back pain x 14 days, pt states she tripped and fell on May 1, has had soreness since then. Pt has difficulty walking due to pain. Pt also c/o lower extremity edema onset 3 weeks ago.

## 2014-06-03 DIAGNOSIS — M1712 Unilateral primary osteoarthritis, left knee: Secondary | ICD-10-CM | POA: Diagnosis not present

## 2014-06-03 DIAGNOSIS — M170 Bilateral primary osteoarthritis of knee: Secondary | ICD-10-CM | POA: Diagnosis not present

## 2014-06-03 DIAGNOSIS — M4806 Spinal stenosis, lumbar region: Secondary | ICD-10-CM | POA: Diagnosis not present

## 2014-06-03 DIAGNOSIS — M5136 Other intervertebral disc degeneration, lumbar region: Secondary | ICD-10-CM | POA: Diagnosis not present

## 2014-06-03 DIAGNOSIS — M25561 Pain in right knee: Secondary | ICD-10-CM | POA: Diagnosis not present

## 2014-06-03 DIAGNOSIS — M1711 Unilateral primary osteoarthritis, right knee: Secondary | ICD-10-CM | POA: Diagnosis not present

## 2014-06-19 DIAGNOSIS — M4806 Spinal stenosis, lumbar region: Secondary | ICD-10-CM | POA: Diagnosis not present

## 2014-06-19 DIAGNOSIS — M5136 Other intervertebral disc degeneration, lumbar region: Secondary | ICD-10-CM | POA: Diagnosis not present

## 2014-06-20 ENCOUNTER — Other Ambulatory Visit: Payer: Self-pay | Admitting: Internal Medicine

## 2014-06-20 NOTE — Telephone Encounter (Signed)
Pt called b/c ativan rx was not at pharmacy Chronic med from Dr. Olevia Perches 2mg  TID PRN I called her pharmacy to renew x 1 month

## 2014-06-21 NOTE — Telephone Encounter (Signed)
Thanks, Ulice Dash. I have been refilling it for a long time.

## 2014-07-29 ENCOUNTER — Other Ambulatory Visit: Payer: Self-pay | Admitting: Internal Medicine

## 2014-07-30 DIAGNOSIS — H2513 Age-related nuclear cataract, bilateral: Secondary | ICD-10-CM | POA: Diagnosis not present

## 2014-08-27 DIAGNOSIS — I2510 Atherosclerotic heart disease of native coronary artery without angina pectoris: Secondary | ICD-10-CM | POA: Diagnosis not present

## 2014-08-27 DIAGNOSIS — F411 Generalized anxiety disorder: Secondary | ICD-10-CM | POA: Diagnosis not present

## 2014-08-27 DIAGNOSIS — I10 Essential (primary) hypertension: Secondary | ICD-10-CM | POA: Diagnosis not present

## 2014-08-27 DIAGNOSIS — J449 Chronic obstructive pulmonary disease, unspecified: Secondary | ICD-10-CM | POA: Diagnosis not present

## 2014-08-29 ENCOUNTER — Other Ambulatory Visit: Payer: Self-pay | Admitting: Internal Medicine

## 2014-08-29 ENCOUNTER — Telehealth: Payer: Self-pay | Admitting: Internal Medicine

## 2014-08-29 ENCOUNTER — Ambulatory Visit: Payer: Medicare Other | Admitting: Internal Medicine

## 2014-08-29 NOTE — Telephone Encounter (Signed)
Denied. She had an appointment today and did not show up.

## 2014-09-01 NOTE — Telephone Encounter (Signed)
No refill, she was no show for OV

## 2014-09-01 NOTE — Telephone Encounter (Signed)
No charge. 

## 2014-09-04 ENCOUNTER — Ambulatory Visit: Payer: Medicare Other | Admitting: Internal Medicine

## 2014-09-05 ENCOUNTER — Encounter: Payer: Self-pay | Admitting: Internal Medicine

## 2014-09-05 ENCOUNTER — Other Ambulatory Visit: Payer: Self-pay

## 2014-09-05 ENCOUNTER — Ambulatory Visit (INDEPENDENT_AMBULATORY_CARE_PROVIDER_SITE_OTHER): Payer: Medicare Other | Admitting: Internal Medicine

## 2014-09-05 VITALS — BP 138/84 | Ht 60.5 in | Wt 141.0 lb

## 2014-09-05 DIAGNOSIS — K5901 Slow transit constipation: Secondary | ICD-10-CM

## 2014-09-05 DIAGNOSIS — R195 Other fecal abnormalities: Secondary | ICD-10-CM

## 2014-09-05 MED ORDER — LORAZEPAM 2 MG PO TABS: 2.0000 mg | ORAL_TABLET | Freq: Three times a day (TID) | ORAL | Status: DC

## 2014-09-05 MED ORDER — POLYETHYLENE GLYCOL 3350 17 GM/SCOOP PO POWD: 17.0000 g | Freq: Every day | ORAL | Status: DC

## 2014-09-05 MED ORDER — FAMOTIDINE 20 MG PO TABS: 20.0000 mg | ORAL_TABLET | Freq: Two times a day (BID) | ORAL | Status: DC

## 2014-09-05 NOTE — Patient Instructions (Addendum)
We have sent the following medications to your pharmacy for you to pick up at your convenience: Ativan  I appreciate the opportunity to care for you.  Dr Rise Patience

## 2014-09-05 NOTE — Progress Notes (Signed)
[name] [birth year] [id number]  Note: This dictation was prepared with Dragon digital system. Any transcriptional errors that result from this procedure are unintentional.   History of Present Illness: This is a 79 year old African-American female with irritable bowel syndrome and history of of right hemicolectomy forl perforated cecum during a gynecological procedure in 1982. She has been recently diagnosed with the follicular lymphoma stage IV after having a monoclonal gammopathy for several years. Dr Alvy Bimler. I have been seeing her for many years for  diarrhea due to  bile overflow and bacterial overgrowth. She is now having  Constipation. Prior colonoscopy in 2008, showed   widely open ileocolonic anastomosis. No polyps. She is also followed for coronary artery disease by Dr. Doylene Canard and has been on Plavix. She is a smoker. She has a history of depression and anxiety for which she takes Ativan 2 mg 3 times a day. She needs a refill today.    Past Medical History  Diagnosis Date  . Duodenitis   . Gastritis   . GERD (gastroesophageal reflux disease)   . Chronic diarrhea   . IBS (irritable bowel syndrome)   . Internal hemorrhoids   . Monoclonal gammopathy   . Urge incontinence   . Benign hypertension   . DJD (degenerative joint disease)   . Depressive disorder   . Anxiety   . Hyperlipidemia   . Vitamin B12 deficiency   . Constipation   . CAD (coronary artery disease)   . Anemia   . Lymphadenopathy 10/22/2012  . Follicular lymphoma 77/58/0998  . Insomnia 02/28/2014    Past Surgical History  Procedure Laterality Date  . Abdominal hysterectomy  1992    with ovarian cystectomy  . Hemicolectomy    . Ileocolonic anasomosis    . Spine surgery    . Coronary stent placement      No Known Allergies  Family history and social history have been reviewed.  Review of Systems: Welling in her feet. Numbness in her feet. Instability. Constipation  The remainder of the 10 point ROS  is negative except as outlined in the H&P  Physical Exam: General Appearance Well developed, in no distress Eyes  Non icteric  HEENT  Non traumatic, normocephalic  Mouth No lesion, tongue papillated, no cheilosis Neck Supple without adenopathy, thyroid not enlarged, no carotid bruits, no JVD Lungs Clear to auscultation bilaterally COR Normal S1, normal S2, regular rhythm, no murmur, quiet precordium Abdomen well-healed surgical scars. Normoactive bowel sounds. No mass. No tenderness Rectal not done Extremities  No pedal edema Skin No lesions Neurological Alert and oriented x 3 Psychological Normal mood and affect  Assessment and Plan:   79 year old African-American female with a functional constipation. She had a remote right hemicolectomy  She will continue MiraLAX 17 g when necessary constipation. She is up-to-date on colonoscopy. Last exam 2008. She may not need a recall colonoscopy due to age  Anxiety depression. Refill Ativan 2 mg by mouth 3 times a day with one refill. She will be seeing her new PCP Dr. Doug Sou next month    Delfin Edis 09/05/2014

## 2014-09-10 ENCOUNTER — Ambulatory Visit: Payer: Medicare Other | Admitting: Internal Medicine

## 2014-09-23 ENCOUNTER — Telehealth: Payer: Self-pay | Admitting: Hematology and Oncology

## 2014-09-23 ENCOUNTER — Other Ambulatory Visit (HOSPITAL_BASED_OUTPATIENT_CLINIC_OR_DEPARTMENT_OTHER): Payer: Medicare Other

## 2014-09-23 ENCOUNTER — Encounter: Payer: Self-pay | Admitting: Hematology and Oncology

## 2014-09-23 ENCOUNTER — Ambulatory Visit (HOSPITAL_BASED_OUTPATIENT_CLINIC_OR_DEPARTMENT_OTHER): Payer: Medicare Other | Admitting: Hematology and Oncology

## 2014-09-23 VITALS — BP 130/86 | HR 90 | Temp 98.0°F | Resp 18 | Ht 60.5 in | Wt 140.4 lb

## 2014-09-23 DIAGNOSIS — G8929 Other chronic pain: Secondary | ICD-10-CM | POA: Diagnosis not present

## 2014-09-23 DIAGNOSIS — M791 Myalgia: Secondary | ICD-10-CM | POA: Diagnosis not present

## 2014-09-23 DIAGNOSIS — Z720 Tobacco use: Secondary | ICD-10-CM

## 2014-09-23 DIAGNOSIS — F17200 Nicotine dependence, unspecified, uncomplicated: Secondary | ICD-10-CM

## 2014-09-23 DIAGNOSIS — C8290 Follicular lymphoma, unspecified, unspecified site: Secondary | ICD-10-CM

## 2014-09-23 DIAGNOSIS — M7918 Myalgia, other site: Secondary | ICD-10-CM

## 2014-09-23 LAB — COMPREHENSIVE METABOLIC PANEL (CC13)
ALBUMIN: 3.8 g/dL (ref 3.5–5.0)
ALT: 29 U/L (ref 0–55)
AST: 28 U/L (ref 5–34)
Alkaline Phosphatase: 112 U/L (ref 40–150)
Anion Gap: 8 mEq/L (ref 3–11)
BILIRUBIN TOTAL: 0.64 mg/dL (ref 0.20–1.20)
BUN: 14.1 mg/dL (ref 7.0–26.0)
CALCIUM: 9.2 mg/dL (ref 8.4–10.4)
CO2: 26 mEq/L (ref 22–29)
CREATININE: 0.8 mg/dL (ref 0.6–1.1)
Chloride: 110 mEq/L — ABNORMAL HIGH (ref 98–109)
EGFR: 84 mL/min/{1.73_m2} — ABNORMAL LOW (ref >90)
Glucose: 100 mg/dl (ref 70–140)
Potassium: 4.3 mEq/L (ref 3.5–5.1)
Sodium: 143 mEq/L (ref 136–145)
TOTAL PROTEIN: 7.1 g/dL (ref 6.4–8.3)

## 2014-09-23 LAB — CBC WITH DIFFERENTIAL/PLATELET
BASO%: 0.6 % (ref 0.0–2.0)
Basophils Absolute: 0 10*3/uL (ref 0.0–0.1)
EOS ABS: 0.1 10*3/uL (ref 0.0–0.5)
EOS%: 2.8 % (ref 0.0–7.0)
HCT: 38.3 % (ref 34.8–46.6)
HGB: 12.7 g/dL (ref 11.6–15.9)
LYMPH%: 40.5 % (ref 14.0–49.7)
MCH: 31.6 pg (ref 25.1–34.0)
MCHC: 33.1 g/dL (ref 31.5–36.0)
MCV: 95.5 fL (ref 79.5–101.0)
MONO#: 0.4 10*3/uL (ref 0.1–0.9)
MONO%: 10.7 % (ref 0.0–14.0)
NEUT#: 1.8 10*3/uL (ref 1.5–6.5)
NEUT%: 45.4 % (ref 38.4–76.8)
Platelets: 188 10*3/uL (ref 145–400)
RBC: 4.01 10*6/uL (ref 3.70–5.45)
RDW: 14.5 % (ref 11.2–14.5)
WBC: 3.9 10*3/uL (ref 3.9–10.3)
lymph#: 1.6 10*3/uL (ref 0.9–3.3)

## 2014-09-23 LAB — LACTATE DEHYDROGENASE (CC13): LDH: 238 U/L (ref 125–245)

## 2014-09-23 NOTE — Assessment & Plan Note (Signed)
This is felt not to be related to her cancer or treatment. I recommend her to quit smoking and graduated exercise as tolerated.

## 2014-09-23 NOTE — Telephone Encounter (Signed)
Appointments made and avs printed for patient °

## 2014-09-23 NOTE — Assessment & Plan Note (Signed)
I reassured the patient had a PET/CT scan was negative. I felt that her intermittent abdominal pain was related to prior surgery. I do not feel that the patient needs to be on chronic pain management. I will see her back in 6 months for repeat history, physical examination and blood work for her lymphoma follow-up. 

## 2014-09-23 NOTE — Assessment & Plan Note (Signed)
I spent some time counseling the patient the importance of tobacco cessation. she is currently attempting to quit on her own 

## 2014-09-23 NOTE — Progress Notes (Signed)
[place] OFFICE PROGRESS NOTE  No care team member to display  SUMMARY OF ONCOLOGIC HISTORY: Oncology History   Follicular lymphoma, FLIPI score of 3 due to age >28, extranodal disease >4 and stage IV at presentation   Primary site: Lymphoid Neoplasms   Staging method: AJCC 6th Edition   Clinical: Stage IV signed by Heath Lark, MD on 10/07/2013  3:59 PM   Summary: Stage IV       Follicular lymphoma   2/70/6237 Imaging PET/CT scan showed diffuse lymphadenopathy and splenomegaly.   09/27/2013 Bone Marrow Biopsy Bone marrow biopsy show lymphoma involvement most consistent with follicular lymphoma.   10/18/2013 - 11/08/2013 Chemotherapy She is given rituximab with prednisone   12/23/2013 Imaging Repeat PET/CT scan showed near-complete response.   03/24/2014 Imaging Repeat PET CT scan showed no evidence of cancer    INTERVAL HISTORY: Please see below for problem oriented charting. She returns for further follow-up. She continues to have chronic musculoskeletal pain. The granddaughter reports that the patient continues to smoke, is up all night and sleep during daytime. She also has been eating sporadically due to poor appetite. She have chronic constipation and admits that she is not using miralax regularly.  REVIEW OF SYSTEMS:   Constitutional: Denies fevers, chills or abnormal weight loss Eyes: Denies blurriness of vision Ears, nose, mouth, throat, and face: Denies mucositis or sore throat Respiratory: Denies cough, dyspnea or wheezes Cardiovascular: Denies palpitation, chest discomfort or lower extremity swelling Skin: Denies abnormal skin rashes Lymphatics: Denies new lymphadenopathy or easy bruising Neurological:Denies numbness, tingling or new weaknesses Behavioral/Psych: Mood is stable, no new changes  All other systems were reviewed with the patient and are negative.  I have reviewed the past medical history, past surgical history, social history and family  history with the patient and they are unchanged from previous note.  ALLERGIES:  has No Known Allergies.  MEDICATIONS:  Current Outpatient Prescriptions  Medication Sig Dispense Refill  . aspirin 81 MG tablet Take 81 mg by mouth daily.      . clopidogrel (PLAVIX) 75 MG tablet Take 75 mg by mouth daily.    . famotidine (PEPCID) 20 MG tablet Take 1 tablet (20 mg total) by mouth 2 (two) times daily. 180 tablet 3  . HYDROcodone-acetaminophen (NORCO) 10-325 MG per tablet Take 1-2 tablets by mouth every 4 (four) hours as needed for pain.     [name] LORazepam (ATIVAN) 2 MG tablet Take 1 tablet (2 mg total) by mouth 3 (three) times daily. 90 tablet 1  . losartan (COZAAR) 100 MG tablet Take 100 mg by mouth 2 (two) times daily.     . mirtazapine (REMERON) 15 MG tablet TAKE 1 TABLET BY MOUTH AT BEDTIME 30 tablet 0  . nitroGLYCERIN (NITROLINGUAL) 0.4 MG/SPRAY spray Place 1 spray under the tongue every 5 (five) minutes x 3 doses as needed for chest pain.    . polyethylene glycol powder (GLYCOLAX/MIRALAX) powder Take 17 g by mouth daily. 500 g 11  . potassium chloride (K-DUR) 10 MEQ tablet   2   No current facility-administered medications for this visit.    PHYSICAL EXAMINATION: ECOG PERFORMANCE STATUS: 2 - Symptomatic, <50% confined to bed  Filed Vitals:   09/23/14 1407  BP: 130/86  Pulse: 90  Temp: 98 F (36.7 C)  Resp: 18   Filed Weights   09/23/14 1407  Weight: 140 lb 6.4 oz (63.685 kg)    GENERAL:alert, no distress and comfortable SKIN: skin color, texture, turgor  are normal, no rashes or significant lesions EYES: normal, Conjunctiva are pink and non-injected, sclera clear OROPHARYNX:no exudate, no erythema and lips, buccal mucosa, and tongue normal  NECK: supple, thyroid normal size, non-tender, without nodularity LYMPH:  no palpable lymphadenopathy in the cervical, axillary or inguinal LUNGS: clear to auscultation and percussion with normal breathing effort HEART: regular rate &  rhythm and no murmurs and no lower extremity edema ABDOMEN:abdomen soft, non-tender and normal bowel sounds Musculoskeletal:no cyanosis of digits and no clubbing  NEURO: alert & oriented x 3 with fluent speech, no focal motor/sensory deficits  LABORATORY DATA:  I have reviewed the data as listed    Component Value Date/Time   NA 143 09/23/2014 1349   NA 140 06/25/2012 1651   K 4.3 09/23/2014 1349   K 4.4 06/25/2012 1651   CL 106 06/25/2012 1651   CO2 26 09/23/2014 1349   CO2 27 06/25/2012 1651   GLUCOSE 100 09/23/2014 1349   GLUCOSE 112* 06/25/2012 1651   BUN 14.1 09/23/2014 1349   BUN 15 09/10/2012 1638   CREATININE 0.8 09/23/2014 1349   CREATININE 0.9 09/10/2012 1638   CREATININE 0.86 05/12/2006 1511   CALCIUM 9.2 09/23/2014 1349   CALCIUM 9.2 06/25/2012 1651   PROT 7.1 09/23/2014 1349   PROT 7.4 06/25/2012 1651   ALBUMIN 3.8 09/23/2014 1349   ALBUMIN 3.7 06/25/2012 1651   AST 28 09/23/2014 1349   AST 13 06/25/2012 1651   ALT 29 09/23/2014 1349   ALT 11 06/25/2012 1651   ALKPHOS 112 09/23/2014 1349   ALKPHOS 83 06/25/2012 1651   BILITOT 0.64 09/23/2014 1349   BILITOT 0.4 06/25/2012 1651   GFRNONAA 83* 06/25/2012 1651   GFRAA >90 06/25/2012 1651    No results found for: SPEP, UPEP  Lab Results  Component Value Date   WBC 3.9 09/23/2014   NEUTROABS 1.8 09/23/2014   HGB 12.7 09/23/2014   HCT 38.3 09/23/2014   MCV 95.5 09/23/2014   PLT 188 09/23/2014      Chemistry      Component Value Date/Time   NA 143 09/23/2014 1349   NA 140 06/25/2012 1651   K 4.3 09/23/2014 1349   K 4.4 06/25/2012 1651   CL 106 06/25/2012 1651   CO2 26 09/23/2014 1349   CO2 27 06/25/2012 1651   BUN 14.1 09/23/2014 1349   BUN 15 09/10/2012 1638   CREATININE 0.8 09/23/2014 1349   CREATININE 0.9 09/10/2012 1638   CREATININE 0.86 05/12/2006 1511      Component Value Date/Time   CALCIUM 9.2 09/23/2014 1349   CALCIUM 9.2 06/25/2012 1651   ALKPHOS 112 09/23/2014 1349   ALKPHOS 83  06/25/2012 1651   AST 28 09/23/2014 1349   AST 13 06/25/2012 1651   ALT 29 09/23/2014 1349   ALT 11 06/25/2012 1651   BILITOT 0.64 09/23/2014 1349   BILITOT 0.4 06/25/2012 1651       ASSESSMENT & PLAN:  Follicular lymphoma I reassured the patient had a PET/CT scan was negative. I felt that her intermittent abdominal pain was related to prior surgery. I do not feel that the patient needs to be on chronic pain management. I will see her back in 6 months for repeat history, physical examination and blood work for her lymphoma follow-up.    TOBACCO DEPENDENCE I spent some time counseling the patient the importance of tobacco cessation. she is currently attempting to quit on her own    Chronic musculoskeletal pain This is felt  not to be related to her cancer or treatment. I recommend her to quit smoking and graduated exercise as tolerated.   Orders Placed This Encounter  Procedures  . NM PET Image Restag (PS) Skull Base To Thigh    Standing Status: Future     Number of Occurrences:      Standing Expiration Date: 11/23/2015    Order Specific Question:  Reason for Exam (SYMPTOM  OR DIAGNOSIS REQUIRED)    Answer:  lymphoma staging    Order Specific Question:  Preferred imaging location?    Answer:  [facility]   All questions were answered. The patient knows to call the clinic with any problems, questions or concerns. No barriers to learning was detected. I spent 15 minutes counseling the patient face to face. The total time spent in the appointment was 20 minutes and more than 50% was on counseling and review of test results     [facility], [place], MD 09/23/2014 2:57 PM

## 2014-09-25 ENCOUNTER — Ambulatory Visit (INDEPENDENT_AMBULATORY_CARE_PROVIDER_SITE_OTHER): Payer: Medicare Other | Admitting: Internal Medicine

## 2014-09-25 ENCOUNTER — Encounter: Payer: Self-pay | Admitting: Internal Medicine

## 2014-09-25 ENCOUNTER — Other Ambulatory Visit: Payer: Self-pay | Admitting: Internal Medicine

## 2014-09-25 VITALS — BP 120/60 | HR 97 | Temp 98.4°F | Resp 14 | Ht 63.0 in | Wt 140.8 lb

## 2014-09-25 DIAGNOSIS — F17200 Nicotine dependence, unspecified, uncomplicated: Secondary | ICD-10-CM

## 2014-09-25 DIAGNOSIS — R51 Headache: Secondary | ICD-10-CM | POA: Diagnosis not present

## 2014-09-25 DIAGNOSIS — F411 Generalized anxiety disorder: Secondary | ICD-10-CM

## 2014-09-25 DIAGNOSIS — R519 Headache, unspecified: Secondary | ICD-10-CM

## 2014-09-25 DIAGNOSIS — G4700 Insomnia, unspecified: Secondary | ICD-10-CM

## 2014-09-25 DIAGNOSIS — M791 Myalgia: Secondary | ICD-10-CM

## 2014-09-25 DIAGNOSIS — I10 Essential (primary) hypertension: Secondary | ICD-10-CM

## 2014-09-25 DIAGNOSIS — F32A Depression, unspecified: Secondary | ICD-10-CM

## 2014-09-25 DIAGNOSIS — F329 Major depressive disorder, single episode, unspecified: Secondary | ICD-10-CM

## 2014-09-25 DIAGNOSIS — K589 Irritable bowel syndrome without diarrhea: Secondary | ICD-10-CM

## 2014-09-25 DIAGNOSIS — Z720 Tobacco use: Secondary | ICD-10-CM

## 2014-09-25 DIAGNOSIS — G8929 Other chronic pain: Secondary | ICD-10-CM

## 2014-09-25 DIAGNOSIS — M7918 Myalgia, other site: Secondary | ICD-10-CM

## 2014-09-25 MED ORDER — DULOXETINE HCL 30 MG PO CPEP: 30.0000 mg | ORAL_CAPSULE | Freq: Every day | ORAL | Status: DC

## 2014-09-25 NOTE — Patient Instructions (Signed)
We have sent in the low dose cymbalta to see if it will help with the pain. Since it is working on the signal from the nerves to the brain it may take 3-4 weeks to reach full effect. Give it at least 3 weeks before you stop taking it unless you have side effects. Then stop taking the medicine and call the office.   The lorazepam is definitely a medicine that can cause a lot of problems as you get older and the dose on it is very high. My recommendation would be to try to skip a lunchtime dose at least a couple times per week to see if you even notice the difference. It is normal to feel irritated during a day and is not a need to take the medicine. That medicine is best used for true panic attacks and only for short term usage. It has been associated in recent years (the last 2-3) with memory problems and balance problems and increased risk of falls. It is listed on the list of medicines that are recommended to avoid in those 43 and older if able. Even decreasing the dose or how often you take it can help.

## 2014-09-25 NOTE — Assessment & Plan Note (Signed)
Suspect some of this is from chronic pain medication usage versus muscle strain. She does take tylenol on top of her hydrocodone/APAP and advised her on the maximal tylenol recommended per day.

## 2014-09-25 NOTE — Progress Notes (Signed)
   Subjective:    Patient ID: [name], female    DOB: [birth year], 79 y.o.   MRN: [id number]  HPI The patient is a 79 YO female coming in new with several concerns. Her biggest concern is all over muscle pain since May. She did fall in May (tripped at night time in the dark). She has 1 other fall in the last year. The pain is in most of her muscles and all the time. She has tried her pain medication for it but it does not help much. She also uses ativan every day TID and does not notice difference with that.  Her second problem is mood and anxiety. She takes remeron nightly and does not notice that this has helped much. She was started on it for appetite but she gets up very late per her son. She takes an ensure then eats dinner only. She states that she does her work best in the evening hours when people are not bothering her. She also takes ativan TID for mood and she does not always notice if she misses a does. Does admit to getting annoyed with people some if she misses a dose. Denies much from the medicine. Denies memory problems. Has had 2 falls in the last year. Tripped over purse strings and then fell in the dark in May.   Her son is with her today and helps to provide some history.   PMH, [facility], social history reviewed and updated.   Review of Systems  Constitutional: Positive for activity change and appetite change. Negative for fever, chills, fatigue and unexpected weight change.  HENT: Negative.   Eyes: Negative.   Respiratory: Negative for cough, chest tightness, shortness of breath and wheezing.   Cardiovascular: Negative for chest pain, palpitations and leg swelling.  Gastrointestinal: Positive for constipation. Negative for nausea, abdominal pain, diarrhea, blood in stool and abdominal distention.  Musculoskeletal: Positive for myalgias, back pain and arthralgias. Negative for gait problem.  Skin: Negative.   Neurological: Positive for weakness. Negative for dizziness,  light-headedness, numbness and headaches.  Psychiatric/Behavioral: Positive for sleep disturbance, dysphoric mood and decreased concentration. Negative for suicidal ideas, self-injury and agitation. The patient is not nervous/anxious and is not hyperactive.        Sleeping more      Objective:   Physical Exam  Constitutional: She is oriented to person, place, and time. She appears well-developed and well-nourished.  HENT:  Head: Normocephalic and atraumatic.  Eyes: EOM are normal.  Neck: Normal range of motion.  Cardiovascular: Normal rate.   Pulmonary/Chest: Effort normal and breath sounds normal. No respiratory distress. She has no wheezes.  Abdominal: Soft. Bowel sounds are normal. She exhibits no distension. There is no tenderness. There is no rebound.  Musculoskeletal: She exhibits no edema.  Neurological: She is alert and oriented to person, place, and time.  Slow gait  Skin: Skin is warm and dry.  Psychiatric: She has a normal mood and affect.   Filed Vitals:   09/25/14 1502  BP: 120/60  Pulse: 97  Temp: 98.4 F (36.9 C)  TempSrc: Oral  Resp: 14  Height: 5\' 3"  (1.6 m)  Weight: 140 lb 12.8 oz (63.866 kg)  SpO2: 97%     Assessment & Plan:

## 2014-09-25 NOTE — Progress Notes (Signed)
Pre visit review using our clinic review tool, if applicable. No additional management support is needed unless otherwise documented below in the visit note. 

## 2014-09-25 NOTE — Assessment & Plan Note (Signed)
She would like to quit and would not like to complicate her regimen further today. Talked to her about nicotine patches and she has tried those in the past. She will work on quitting and at next visit will check in with progress. No quit date yet.

## 2014-09-25 NOTE — Assessment & Plan Note (Signed)
Would not add more narcotics or other sedating agents. Rx for cymbalta 30 mg daily today. Advised she needs to wait 3 weeks at least. Could consider trial of lyrica if she fails cymbalta. Additionally water aerobics would be good for her, she was not willing to think about that today.

## 2014-09-25 NOTE — Assessment & Plan Note (Signed)
She does still have poor sleep hygiene and her son adds that she sleeps most of the day and stays up late. She denies. She is taking remeron but have not noticed much difference. Will monitor and would not advise any further sedating agents for sleep.

## 2014-09-25 NOTE — Assessment & Plan Note (Signed)
Will try adding cymbalta as this may help with her chronic pain as well. She was prescribed this in the past and did not take. She agreed to try it for 3 weeks unless she gets side effect. Has not noticed anything from remeron and if does okay on cymbalta will stop at follow up.

## 2014-09-25 NOTE — Assessment & Plan Note (Signed)
She is on exceptionally high dose regimen of ativan 2 mg TID from previous provider. She admits to me that she does not notice much when she takes it and once went several days before feeling different. She does state that she gets annoyed some when she misses a dose at people living in her house. Advised her to decrease to twice a day and talked to her extensively about the beers list and the potential for falls and memory impairment (she adamantly denies but states that her grandchildren have told her she has problems with this). Adding cymbalta for better coverage and hopefully will be able to decrease more at follow up in 1-2 months.

## 2014-09-25 NOTE — Assessment & Plan Note (Signed)
Previously was having more diarrheal predominant. In the recent years more constipation predominant. This is likely from medications. She does manage fairly well with miralax and dietary changes. Would be better if we could get her off narcotics.

## 2014-09-25 NOTE — Assessment & Plan Note (Signed)
Currently controlled on losartan 100 mg daily (confirmed with her dosing)

## 2014-09-30 DIAGNOSIS — H02839 Dermatochalasis of unspecified eye, unspecified eyelid: Secondary | ICD-10-CM | POA: Diagnosis not present

## 2014-09-30 DIAGNOSIS — H2512 Age-related nuclear cataract, left eye: Secondary | ICD-10-CM | POA: Diagnosis not present

## 2014-09-30 DIAGNOSIS — H2513 Age-related nuclear cataract, bilateral: Secondary | ICD-10-CM | POA: Diagnosis not present

## 2014-09-30 DIAGNOSIS — H18413 Arcus senilis, bilateral: Secondary | ICD-10-CM | POA: Diagnosis not present

## 2014-10-28 DIAGNOSIS — I361 Nonrheumatic tricuspid (valve) insufficiency: Secondary | ICD-10-CM | POA: Diagnosis not present

## 2014-10-28 DIAGNOSIS — I340 Nonrheumatic mitral (valve) insufficiency: Secondary | ICD-10-CM | POA: Diagnosis not present

## 2014-10-28 DIAGNOSIS — I425 Other restrictive cardiomyopathy: Secondary | ICD-10-CM | POA: Diagnosis not present

## 2014-11-17 ENCOUNTER — Telehealth: Payer: Self-pay | Admitting: Internal Medicine

## 2014-11-17 NOTE — Telephone Encounter (Signed)
Appt made for 11/18/14 w/Dr. Doug Sou...Johny Chess

## 2014-11-17 NOTE — Telephone Encounter (Signed)
Patient Name: NAYLANI BRADNER  DOB: 21-Oct-1935    Initial Comment caller states mother has a burn on wrist   Nurse Assessment  Nurse: Leilani Merl, RN, Nira Conn Date/Time (Eastern Time): 11/17/2014 2:50:42 PM  Confirm and document reason for call. If symptomatic, describe symptoms. ---Her mother got a burn on her wrist that is about the size of a half dollar on Saturday, it looks like a 2nd degree burn  Has the patient traveled out of the country within the last 30 days? ---Not Applicable  Does the patient have any new or worsening symptoms? ---Yes  Will a triage be completed? ---Yes  Related visit to physician within the last 2 weeks? ---No  Does the PT have any chronic conditions? (i.e. diabetes, asthma, etc.) ---Yes  List chronic conditions. ---see MR     Guidelines    Guideline Title Affirmed Question Affirmed Notes  Burns - Thermal Minor thermal burn (all triage questions negative)    Final Disposition User   See Physician within 24 Hours Stand[place], RN, [employer]    Referrals  REFERRED TO PCP OFFICE   Disagree/Comply: Comply

## 2014-11-18 ENCOUNTER — Encounter: Payer: Self-pay | Admitting: Family

## 2014-11-18 ENCOUNTER — Ambulatory Visit (INDEPENDENT_AMBULATORY_CARE_PROVIDER_SITE_OTHER): Payer: Medicare Other | Admitting: Family

## 2014-11-18 ENCOUNTER — Telehealth: Payer: Self-pay | Admitting: Internal Medicine

## 2014-11-18 VITALS — BP 154/86 | HR 84 | Temp 98.0°F | Resp 18 | Ht 63.0 in | Wt 139.0 lb

## 2014-11-18 DIAGNOSIS — T23279A Burn of second degree of unspecified wrist, initial encounter: Secondary | ICD-10-CM

## 2014-11-18 DIAGNOSIS — Z23 Encounter for immunization: Secondary | ICD-10-CM

## 2014-11-18 MED ORDER — LORAZEPAM 2 MG PO TABS: 2.0000 mg | ORAL_TABLET | Freq: Two times a day (BID) | ORAL | Status: DC | PRN

## 2014-11-18 NOTE — Assessment & Plan Note (Signed)
Partial-thickness burn of left wrist with no evidence of infection. Wound dressed with dry gauze and bandage. Instructed to keep clean with soap and water. Antibiotic ointment as needed and used sparingly. Change dressing daily.  Follow up for worsening or signs of infection.

## 2014-11-18 NOTE — Patient Instructions (Addendum)
Thank you for choosing [facility].  Summary/Instructions:  If your symptoms worsen or fail to improve, please contact our office for further instruction, or in case of emergency go directly to the emergency room at the closest medical facility.   Burn Care Your skin is a natural barrier to infection. It is the largest organ of your body. Burns damage this natural protection. To help prevent infection, it is very important to follow your caregiver's instructions in the care of your burn. Burns are classified as:  First degree. There is only redness of the skin (erythema). No scarring is expected.  Second degree. There is blistering of the skin. Scarring may occur with deeper burns.  Third degree. All layers of the skin are injured, and scarring is expected. HOME CARE INSTRUCTIONS   Wash your hands well before changing your bandage.  Change your bandage as often as directed by your caregiver.  Remove the old bandage. If the bandage sticks, you may soak it off with cool, clean water.  Cleanse the burn thoroughly but gently with mild soap and water.  Pat the area dry with a clean, dry cloth.  Apply a clean bandage as instructed by your caregiver.  Keep the bandage as clean and dry as possible.  Elevate the affected area for the first 24 hours, then as instructed by your caregiver.  Only take over-the-counter or prescription medicines for pain, discomfort, or fever as directed by your caregiver. SEEK IMMEDIATE MEDICAL CARE IF:   You develop excessive pain.  You develop redness, tenderness, swelling, or red streaks near the burn.  The burned area develops yellowish-white fluid (pus) or a bad smell.  You have a fever. MAKE SURE YOU:   Understand these instructions.  Will watch your condition.  Will get help right away if you are not doing well or get worse.   This information is not intended to replace advice given to you by your health care provider. Make sure you  discuss any questions you have with your health care provider.   Document Released: 01/10/2005 Document Revised: 04/04/2011 Document Reviewed: 06/02/2010 Elsevier Interactive Patient Education [employer].

## 2014-11-18 NOTE — Progress Notes (Signed)
Pre visit review using our clinic review tool, if applicable. No additional management support is needed unless otherwise documented below in the visit note. 

## 2014-11-18 NOTE — Progress Notes (Signed)
Subjective:    Patient ID: [name], female    DOB: 1935-10-25, 79 y.o.   MRN: [id number]  Chief Complaint  Patient presents with  . Burn    Got burned on her left wrist by grits she had just taken out of the microwave     HPI:  [name] is a 79 y.o. female who  has a past medical history of Duodenitis; Gastritis; GERD (gastroesophageal reflux disease); Chronic diarrhea; IBS (irritable bowel syndrome); Internal hemorrhoids; Monoclonal gammopathy; Urge incontinence; Benign hypertension; DJD (degenerative joint disease); Depressive disorder; Anxiety; Hyperlipidemia; Vitamin B12 deficiency; Constipation; CAD (coronary artery disease); Anemia; Lymphadenopathy (10/22/2012); Follicular lymphoma ([place]) (10/07/2013); and Insomnia (02/28/2014). and presents today for an acute office visit.  This is a new problem. Associated symptom of a burn located on her left wrist has been going on for approximately 3 days following removing food from the microwave. The hot foot spilled on her arm resulting in a second degree burn on her left wrist.  Modifying factors include over the counter burn cream which has helped a little   No Known Allergies   Current Outpatient Prescriptions on File Prior to Visit  Medication Sig Dispense Refill  . aspirin 81 MG tablet Take 81 mg by mouth daily.      . clopidogrel (PLAVIX) 75 MG tablet Take 75 mg by mouth daily.    . famotidine (PEPCID) 20 MG tablet Take 1 tablet (20 mg total) by mouth 2 (two) times daily. 180 tablet 3  . HYDROcodone-acetaminophen (NORCO) 10-325 MG per tablet Take 1-2 tablets by mouth every 4 (four) hours as needed for pain.     [name] LORazepam (ATIVAN) 2 MG tablet Take 1 tablet (2 mg total) by mouth 3 (three) times daily. 90 tablet 1  . losartan (COZAAR) 100 MG tablet Take 100 mg by mouth 2 (two) times daily.     . mirtazapine (REMERON) 15 MG tablet TAKE 1 TABLET BY MOUTH AT BEDTIME 30 tablet 0  . nitroGLYCERIN (NITROLINGUAL) 0.4 MG/SPRAY spray  Place 1 spray under the tongue every 5 (five) minutes x 3 doses as needed for chest pain.    . polyethylene glycol powder (GLYCOLAX/MIRALAX) powder Take 17 g by mouth daily. 500 g 11  . potassium chloride (K-DUR) 10 MEQ tablet Take 10 mEq by mouth once.   2   No current facility-administered medications on file prior to visit.    Review of Systems  Constitutional: Negative for fever and chills.  Skin: Positive for wound.      Objective:    BP 154/86 mmHg  Pulse 84  Temp(Src) 98 F (36.7 C) (Oral)  Resp 18  Ht 5\' 3"  (1.6 m)  Wt 139 lb (63.05 kg)  BMI 24.63 kg/m2  SpO2 98% Nursing note and vital signs reviewed.  Physical Exam  Constitutional: She is oriented to person, place, and time. She appears well-developed and well-nourished. No distress.  Cardiovascular: Normal rate, regular rhythm, normal heart sounds and intact distal pulses.   Pulmonary/Chest: Effort normal and breath sounds normal.  Neurological: She is alert and oriented to person, place, and time.  Skin: Skin is warm and dry.  7 cm x 2.5 cm oblong shaped partial thickness burn located on the left wrist.   Psychiatric: She has a normal mood and affect. Her behavior is normal. Judgment and thought content normal.       Assessment & Plan:   Problem List Items Addressed This Visit  Other   Partial thickness burn of wrist - Primary     Partial-thickness burn of left wrist with no evidence of infection. Wound dressed with dry gauze and bandage. Instructed to keep clean with soap and water. Antibiotic ointment as needed and used sparingly. Change dressing daily.  Follow up for worsening or signs of infection.

## 2014-11-18 NOTE — Telephone Encounter (Signed)
Will refill for BID 1 month (as directed at last visit) but overdue for follow up visit.

## 2014-11-18 NOTE — Telephone Encounter (Signed)
Patient is requesting a refill for LORazepam (ATIVAN) 2 MG tablet [[id number]] . She states that the cymbalta is not working. She states that she cannot take it because it makes her sick. Pharmacy is [facility] @ [address].

## 2014-11-21 ENCOUNTER — Other Ambulatory Visit: Payer: Self-pay | Admitting: Internal Medicine

## 2014-11-25 ENCOUNTER — Ambulatory Visit (INDEPENDENT_AMBULATORY_CARE_PROVIDER_SITE_OTHER): Payer: Medicare Other | Admitting: Internal Medicine

## 2014-11-25 ENCOUNTER — Encounter: Payer: Self-pay | Admitting: Internal Medicine

## 2014-11-25 VITALS — BP 112/60 | HR 98 | Temp 98.3°F | Resp 12 | Ht 62.0 in | Wt 136.0 lb

## 2014-11-25 DIAGNOSIS — F17200 Nicotine dependence, unspecified, uncomplicated: Secondary | ICD-10-CM | POA: Diagnosis not present

## 2014-11-25 DIAGNOSIS — T23279A Burn of second degree of unspecified wrist, initial encounter: Secondary | ICD-10-CM

## 2014-11-25 DIAGNOSIS — I10 Essential (primary) hypertension: Secondary | ICD-10-CM | POA: Diagnosis not present

## 2014-11-25 DIAGNOSIS — F411 Generalized anxiety disorder: Secondary | ICD-10-CM | POA: Diagnosis not present

## 2014-11-25 MED ORDER — DICLOFENAC SODIUM 1 % TD GEL: 2.0000 g | Freq: Three times a day (TID) | TRANSDERMAL | Status: DC | PRN

## 2014-11-25 MED ORDER — BENZONATATE 200 MG PO CAPS: 200.0000 mg | ORAL_CAPSULE | Freq: Three times a day (TID) | ORAL | Status: DC | PRN

## 2014-11-25 NOTE — Assessment & Plan Note (Signed)
She was not able to take cymbalta although she did not call to let us know. She is using the lorazepam BID prn and will continue this, may try to decrease dosing since she is on such a strong dose. Is taking remeron and can increase that dose if she is still having sleeping problems, she did not want to make changes today.

## 2014-11-25 NOTE — Assessment & Plan Note (Signed)
Still smoking and no intention to stop. She does have a cough which is likely related to her smoking. Asked her to quit and she will not. Reminded about the risks and harms. Tessalon perles rxed for cough. No indication for antibiotics today.

## 2014-11-25 NOTE — Patient Instructions (Signed)
We have sent in [place] for the cough to help with it. This should decrease the cough. The smoking is likely causing some of the cough.   The burn will keep healing and should not impact the cataract healing.   I will check into the biopsy to see what was done last year and we have sent in a cream that you can use on the spot for pain. You can also use the cream on other spots that hurt.

## 2014-11-25 NOTE — Assessment & Plan Note (Signed)
BP at goal on losartan. Lab checked recently and no indication to repeat today.

## 2014-11-25 NOTE — Assessment & Plan Note (Signed)
Appears to be healing, not infected appearing. Can continue with local wound care and avoid re-injury. Not sure that this would delay cataract surgery and would clear her for that today.

## 2014-11-25 NOTE — Progress Notes (Signed)
Pre visit review using our clinic review tool, if applicable. No additional management support is needed unless otherwise documented below in the visit note. 

## 2014-11-25 NOTE — Progress Notes (Signed)
   Subjective:    Patient ID: [name], female    DOB: 03-07-1935, 79 y.o.   MRN: [id number]  HPI The patient is a 79 YO female coming in for follow up of her depression. She started taking the cymbalta and is not having any side effects. Not sure that it is helping that much. Still taking the lorazepam scheduled and not prn. She was advised by her husband to use to TID but I directed her to use only BID prn. Her mood is fine and she does not have a problem with it. Also wants me to look at a burn on her wrist. Happened about 1-2 weeks ago. Still sore, no purulent drainage. No fevers or chills. Healing but has delayed her cataract surgery. She has been careful not to re-injure it.   Review of Systems  Constitutional: Positive for activity change and appetite change. Negative for fever, chills, fatigue and unexpected weight change.  HENT: Negative.   Eyes: Negative.   Respiratory: Negative for cough, chest tightness, shortness of breath and wheezing.   Cardiovascular: Negative for chest pain, palpitations and leg swelling.  Gastrointestinal: Negative for nausea, abdominal pain, diarrhea, blood in stool and abdominal distention.  Musculoskeletal: Positive for myalgias, back pain and arthralgias. Negative for gait problem.  Skin: Positive for wound. Negative for color change and pallor.  Neurological: Positive for weakness. Negative for dizziness, light-headedness, numbness and headaches.  Psychiatric/Behavioral: Positive for sleep disturbance. Negative for suicidal ideas, self-injury, dysphoric mood, decreased concentration and agitation. The patient is not nervous/anxious and is not hyperactive.        Sleeping less      Objective:   Physical Exam  Constitutional: She is oriented to person, place, and time. She appears well-developed and well-nourished.  HENT:  Head: Normocephalic and atraumatic.  Eyes: EOM are normal.  Neck: Normal range of motion.  Cardiovascular: Normal rate.     Pulmonary/Chest: Effort normal and breath sounds normal. No respiratory distress. She has no wheezes.  Abdominal: Soft. Bowel sounds are normal. She exhibits no distension. There is no tenderness. There is no rebound.  Musculoskeletal: She exhibits no edema.  Neurological: She is alert and oriented to person, place, and time.  Slow gait  Skin: Skin is warm and dry.  Psychiatric: She has a normal mood and affect.   Filed Vitals:   11/25/14 1438  BP: 112/60  Pulse: 98  Temp: 98.3 F (36.8 C)  TempSrc: Oral  Resp: 12  Height: 5\' 2"  (1.575 m)  Weight: 136 lb (61.689 kg)  SpO2: 97%      Assessment & Plan:

## 2014-11-27 ENCOUNTER — Telehealth: Payer: Self-pay

## 2014-11-27 NOTE — Telephone Encounter (Signed)
PA initiated via covermymeds. ELM:[id number]

## 2014-11-29 NOTE — Telephone Encounter (Signed)
Pt called and wanted to know if there was a different cream that could be called in. Informed pt that we are working on a PA to have insurance cover it. Pt stated understanding. Will request alternative from PCP if PA is denied.

## 2014-12-03 NOTE — Telephone Encounter (Signed)
A user error has taken place.

## 2014-12-04 NOTE — Telephone Encounter (Signed)
PA for diclofenac approved.   Pharmacy informed via faxed with note to inform pt when ready for pick up.

## 2014-12-08 DIAGNOSIS — H25812 Combined forms of age-related cataract, left eye: Secondary | ICD-10-CM | POA: Diagnosis not present

## 2014-12-08 DIAGNOSIS — H2512 Age-related nuclear cataract, left eye: Secondary | ICD-10-CM | POA: Diagnosis not present

## 2014-12-09 DIAGNOSIS — H2511 Age-related nuclear cataract, right eye: Secondary | ICD-10-CM | POA: Diagnosis not present

## 2014-12-23 DIAGNOSIS — H2513 Age-related nuclear cataract, bilateral: Secondary | ICD-10-CM | POA: Diagnosis not present

## 2014-12-25 ENCOUNTER — Telehealth: Payer: Self-pay | Admitting: *Deleted

## 2014-12-25 MED ORDER — LORAZEPAM 1 MG PO TABS: 1.0000 mg | ORAL_TABLET | Freq: Two times a day (BID) | ORAL | Status: DC | PRN

## 2014-12-25 NOTE — Telephone Encounter (Signed)
Notified pt rx fax to pharmacy.../lmb 

## 2014-12-25 NOTE — Telephone Encounter (Signed)
Rx refilled.

## 2014-12-25 NOTE — Telephone Encounter (Signed)
Left msg on triage stating need refill on her Lorazepam.../lmb

## 2014-12-31 DIAGNOSIS — J449 Chronic obstructive pulmonary disease, unspecified: Secondary | ICD-10-CM | POA: Diagnosis not present

## 2014-12-31 DIAGNOSIS — I10 Essential (primary) hypertension: Secondary | ICD-10-CM | POA: Diagnosis not present

## 2014-12-31 DIAGNOSIS — I2510 Atherosclerotic heart disease of native coronary artery without angina pectoris: Secondary | ICD-10-CM | POA: Diagnosis not present

## 2014-12-31 DIAGNOSIS — F411 Generalized anxiety disorder: Secondary | ICD-10-CM | POA: Diagnosis not present

## 2015-01-09 DIAGNOSIS — M5136 Other intervertebral disc degeneration, lumbar region: Secondary | ICD-10-CM | POA: Diagnosis not present

## 2015-01-09 DIAGNOSIS — M4806 Spinal stenosis, lumbar region: Secondary | ICD-10-CM | POA: Diagnosis not present

## 2015-01-09 DIAGNOSIS — G894 Chronic pain syndrome: Secondary | ICD-10-CM | POA: Diagnosis not present

## 2015-01-09 DIAGNOSIS — M25561 Pain in right knee: Secondary | ICD-10-CM | POA: Diagnosis not present

## 2015-02-16 ENCOUNTER — Other Ambulatory Visit: Payer: Self-pay | Admitting: Internal Medicine

## 2015-02-17 NOTE — Telephone Encounter (Signed)
Sent to pharmacy 

## 2015-02-17 NOTE — Telephone Encounter (Signed)
Sent to pharmacy,

## 2015-03-17 ENCOUNTER — Other Ambulatory Visit: Payer: Self-pay | Admitting: Internal Medicine

## 2015-03-18 NOTE — Telephone Encounter (Signed)
Faxed to pharmacy

## 2015-03-19 ENCOUNTER — Ambulatory Visit: Payer: Medicare Other | Admitting: Internal Medicine

## 2015-03-25 ENCOUNTER — Other Ambulatory Visit: Payer: Medicare Other

## 2015-03-25 ENCOUNTER — Ambulatory Visit (HOSPITAL_COMMUNITY): Payer: Medicare Other

## 2015-03-26 ENCOUNTER — Encounter (HOSPITAL_COMMUNITY)
Admission: RE | Admit: 2015-03-26 | Discharge: 2015-03-26 | Disposition: A | Discharge status: Home / Self Care | Payer: Medicare Other | Source: Ambulatory Visit | Attending: Hematology and Oncology | Admitting: Hematology and Oncology

## 2015-03-26 ENCOUNTER — Other Ambulatory Visit (HOSPITAL_BASED_OUTPATIENT_CLINIC_OR_DEPARTMENT_OTHER): Payer: Medicare Other

## 2015-03-26 ENCOUNTER — Encounter: Payer: Medicare Other | Admitting: Hematology and Oncology

## 2015-03-26 DIAGNOSIS — C8290 Follicular lymphoma, unspecified, unspecified site: Secondary | ICD-10-CM | POA: Diagnosis not present

## 2015-03-26 DIAGNOSIS — E041 Nontoxic single thyroid nodule: Secondary | ICD-10-CM | POA: Diagnosis not present

## 2015-03-26 DIAGNOSIS — C8590 Non-Hodgkin lymphoma, unspecified, unspecified site: Secondary | ICD-10-CM | POA: Diagnosis not present

## 2015-03-26 LAB — GLUCOSE, CAPILLARY: Glucose-Capillary: 84 mg/dL (ref 65–99)

## 2015-03-26 MED ORDER — FLUDEOXYGLUCOSE F - 18 (FDG) INJECTION
6.8000 | Freq: Once | INTRAVENOUS | Status: AC | PRN
  Administered 2015-03-26: 6.8 via INTRAVENOUS

## 2015-03-26 NOTE — Progress Notes (Signed)
This encounter was created in error - please disregard.

## 2015-03-27 ENCOUNTER — Other Ambulatory Visit: Payer: Self-pay | Admitting: *Deleted

## 2015-03-27 ENCOUNTER — Telehealth: Payer: Self-pay | Admitting: Hematology and Oncology

## 2015-03-27 NOTE — Telephone Encounter (Signed)
s.w. pt and r/s missed appt.....pt ok and aware °

## 2015-04-06 ENCOUNTER — Other Ambulatory Visit (HOSPITAL_BASED_OUTPATIENT_CLINIC_OR_DEPARTMENT_OTHER): Payer: Medicare Other

## 2015-04-06 ENCOUNTER — Telehealth: Payer: Self-pay | Admitting: Hematology and Oncology

## 2015-04-06 ENCOUNTER — Ambulatory Visit (HOSPITAL_BASED_OUTPATIENT_CLINIC_OR_DEPARTMENT_OTHER): Payer: Medicare Other | Admitting: Hematology and Oncology

## 2015-04-06 ENCOUNTER — Encounter: Payer: Self-pay | Admitting: Hematology and Oncology

## 2015-04-06 VITALS — BP 160/76 | HR 85 | Temp 98.2°F | Resp 17 | Ht 62.0 in | Wt 140.6 lb

## 2015-04-06 DIAGNOSIS — J029 Acute pharyngitis, unspecified: Secondary | ICD-10-CM | POA: Insufficient documentation

## 2015-04-06 DIAGNOSIS — Z8572 Personal history of non-Hodgkin lymphomas: Secondary | ICD-10-CM

## 2015-04-06 DIAGNOSIS — F17200 Nicotine dependence, unspecified, uncomplicated: Secondary | ICD-10-CM

## 2015-04-06 DIAGNOSIS — G4700 Insomnia, unspecified: Secondary | ICD-10-CM

## 2015-04-06 DIAGNOSIS — C8200 Follicular lymphoma grade I, unspecified site: Secondary | ICD-10-CM

## 2015-04-06 DIAGNOSIS — R9389 Abnormal findings on diagnostic imaging of other specified body structures: Secondary | ICD-10-CM

## 2015-04-06 DIAGNOSIS — M7918 Myalgia, other site: Secondary | ICD-10-CM

## 2015-04-06 DIAGNOSIS — M791 Myalgia: Secondary | ICD-10-CM

## 2015-04-06 DIAGNOSIS — R946 Abnormal results of thyroid function studies: Secondary | ICD-10-CM | POA: Diagnosis not present

## 2015-04-06 DIAGNOSIS — R1310 Dysphagia, unspecified: Secondary | ICD-10-CM | POA: Diagnosis not present

## 2015-04-06 DIAGNOSIS — G8929 Other chronic pain: Secondary | ICD-10-CM

## 2015-04-06 HISTORY — DX: Dysphagia, unspecified: R13.10

## 2015-04-06 HISTORY — DX: Acute pharyngitis, unspecified: J02.9

## 2015-04-06 HISTORY — DX: Abnormal findings on diagnostic imaging of other specified body structures: R93.89

## 2015-04-06 LAB — COMPREHENSIVE METABOLIC PANEL
ALBUMIN: 3.8 g/dL (ref 3.5–5.0)
ALK PHOS: 90 U/L (ref 40–150)
ALT: 14 U/L (ref 0–55)
ANION GAP: 8 meq/L (ref 3–11)
AST: 15 U/L (ref 5–34)
BUN: 14.4 mg/dL (ref 7.0–26.0)
CALCIUM: 8.7 mg/dL (ref 8.4–10.4)
CO2: 24 mEq/L (ref 22–29)
CREATININE: 0.9 mg/dL (ref 0.6–1.1)
Chloride: 109 mEq/L (ref 98–109)
EGFR: 73 mL/min/{1.73_m2} — ABNORMAL LOW (ref >90)
Glucose: 91 mg/dl (ref 70–140)
POTASSIUM: 3.6 meq/L (ref 3.5–5.1)
Sodium: 141 mEq/L (ref 136–145)
Total Bilirubin: 0.69 mg/dL (ref 0.20–1.20)
Total Protein: 7.2 g/dL (ref 6.4–8.3)

## 2015-04-06 LAB — CBC WITH DIFFERENTIAL/PLATELET
BASO%: 0.4 % (ref 0.0–2.0)
Basophils Absolute: 0 10*3/uL (ref 0.0–0.1)
EOS ABS: 0.2 10*3/uL (ref 0.0–0.5)
EOS%: 3.1 % (ref 0.0–7.0)
HCT: 36.5 % (ref 34.8–46.6)
HEMOGLOBIN: 12.4 g/dL (ref 11.6–15.9)
LYMPH%: 43 % (ref 14.0–49.7)
MCH: 32.3 pg (ref 25.1–34.0)
MCHC: 34 g/dL (ref 31.5–36.0)
MCV: 95.1 fL (ref 79.5–101.0)
MONO#: 0.5 10*3/uL (ref 0.1–0.9)
MONO%: 10.2 % (ref 0.0–14.0)
NEUT%: 43.3 % (ref 38.4–76.8)
NEUTROS ABS: 2.3 10*3/uL (ref 1.5–6.5)
PLATELETS: 167 10*3/uL (ref 145–400)
RBC: 3.84 10*6/uL (ref 3.70–5.45)
RDW: 14.1 % (ref 11.2–14.5)
WBC: 5.2 10*3/uL (ref 3.9–10.3)
lymph#: 2.2 10*3/uL (ref 0.9–3.3)

## 2015-04-06 LAB — LACTATE DEHYDROGENASE: LDH: 244 U/L (ref 125–245)

## 2015-04-06 MED ORDER — MIRTAZAPINE 15 MG PO TABS: 15.0000 mg | ORAL_TABLET | Freq: Every day | ORAL | Status: DC

## 2015-04-06 NOTE — Telephone Encounter (Signed)
per pof to sch pt appt-made appt @ Dr Lucia Gaskins office for 3/14 @2 :45-gave copy of avs

## 2015-04-06 NOTE — Assessment & Plan Note (Signed)
I spent some time counseling the patient the importance of tobacco cessation. she is currently attempting to quit on her own 

## 2015-04-06 NOTE — Progress Notes (Signed)
[facility] OFFICE PROGRESS NOTE  Patient Care Team: Hoyt Koch, MD as PCP - General (Internal Medicine)  SUMMARY OF ONCOLOGIC HISTORY: Oncology History   Follicular lymphoma, FLIPI score of 3 due to age >72, extranodal disease >4 and stage IV at presentation   Primary site: Lymphoid Neoplasms   Staging method: AJCC 6th Edition   Clinical: Stage IV signed by Heath Lark, MD on 10/07/2013  3:59 PM   Summary: Stage IV       History of B-cell lymphoma   09/03/2013 Imaging PET/CT scan showed diffuse lymphadenopathy and splenomegaly.   09/27/2013 Bone Marrow Biopsy Bone marrow biopsy show lymphoma involvement most consistent with follicular lymphoma.   10/18/2013 - 11/08/2013 Chemotherapy She is given rituximab with prednisone   12/23/2013 Imaging Repeat PET/CT scan showed near-complete response.   03/24/2014 Imaging Repeat PET CT scan showed no evidence of cancer   03/26/2015 Imaging PET scan is negative    INTERVAL HISTORY: Please see below for problem oriented charting. She returns for further follow-up. She complained of sensation of sore throat and diffuse chronic Musculoskeletal pain. She also complained of mild dysphagia. She continues to smoke. Denies recent infection  REVIEW OF SYSTEMS:   Constitutional: Denies fevers, chills or abnormal weight loss Eyes: Denies blurriness of vision Respiratory: Denies cough, dyspnea or wheezes Cardiovascular: Denies palpitation, chest discomfort or lower extremity swelling Gastrointestinal:  Denies nausea, heartburn or change in bowel habits Skin: Denies abnormal skin rashes Lymphatics: Denies new lymphadenopathy or easy bruising Neurological:Denies numbness, tingling or new weaknesses Behavioral/Psych: Mood is stable, no new changes  All other systems were reviewed with the patient and are negative.  I have reviewed the past medical history, past surgical history, social history and family history with the patient and  they are unchanged from previous note.  ALLERGIES:  has No Known Allergies.  MEDICATIONS:  Current Outpatient Prescriptions  Medication Sig Dispense Refill  . aspirin 81 MG tablet Take 81 mg by mouth daily.      . clopidogrel (PLAVIX) 75 MG tablet Take 75 mg by mouth daily.    . diclofenac sodium (VOLTAREN) 1 % GEL Apply 2 g topically 3 (three) times daily as needed (pain). 100 g 0  . famotidine (PEPCID) 20 MG tablet Take 1 tablet (20 mg total) by mouth 2 (two) times daily. 180 tablet 3  . HYDROcodone-acetaminophen (NORCO) 10-325 MG per tablet Take 1-2 tablets by mouth every 4 (four) hours as needed for pain.     [name] LORazepam (ATIVAN) 1 MG tablet TAKE 1 TABLET BY MOUTH TWICE DAILY AS NEEDED FOR ANXIETY 60 tablet 2  . losartan (COZAAR) 100 MG tablet Take 100 mg by mouth 2 (two) times daily.     . mirtazapine (REMERON) 15 MG tablet Take 1 tablet (15 mg total) by mouth at bedtime. 30 tablet 6  . nitroGLYCERIN (NITROLINGUAL) 0.4 MG/SPRAY spray Place 1 spray under the tongue every 5 (five) minutes x 3 doses as needed for chest pain.    . polyethylene glycol powder (GLYCOLAX/MIRALAX) powder Take 17 g by mouth daily. 500 g 11  . potassium chloride (K-DUR) 10 MEQ tablet Take 10 mEq by mouth 2 (two) times daily.   2   No current facility-administered medications for this visit.    PHYSICAL EXAMINATION: ECOG PERFORMANCE STATUS: 1 - Symptomatic but completely ambulatory  Filed Vitals:   04/06/15 1519  BP: 160/76  Pulse: 85  Temp: 98.2 F (36.8 C)  Resp: 17   Filed  Weights   04/06/15 1519  Weight: 140 lb 9.6 oz (63.776 kg)    GENERAL:alert, no distress and comfortable SKIN: skin color, texture, turgor are normal, no rashes or significant lesions EYES: normal, Conjunctiva are pink and non-injected, sclera clear Musculoskeletal:no cyanosis of digits and no clubbing  NEURO: alert & oriented x 3 with fluent speech, no focal motor/sensory deficits  LABORATORY DATA:  I have reviewed the  data as listed    Component Value Date/Time   NA 141 04/06/2015 1507   NA 140 06/25/2012 1651   K 3.6 04/06/2015 1507   K 4.4 06/25/2012 1651   CL 106 06/25/2012 1651   CO2 24 04/06/2015 1507   CO2 27 06/25/2012 1651   GLUCOSE 91 04/06/2015 1507   GLUCOSE 112* 06/25/2012 1651   BUN 14.4 04/06/2015 1507   BUN 15 09/10/2012 1638   CREATININE 0.9 04/06/2015 1507   CREATININE 0.9 09/10/2012 1638   CREATININE 0.86 05/12/2006 1511   CALCIUM 8.7 04/06/2015 1507   CALCIUM 9.2 06/25/2012 1651   PROT 7.2 04/06/2015 1507   PROT 7.4 06/25/2012 1651   ALBUMIN 3.8 04/06/2015 1507   ALBUMIN 3.7 06/25/2012 1651   AST 15 04/06/2015 1507   AST 13 06/25/2012 1651   ALT 14 04/06/2015 1507   ALT 11 06/25/2012 1651   ALKPHOS 90 04/06/2015 1507   ALKPHOS 83 06/25/2012 1651   BILITOT 0.69 04/06/2015 1507   BILITOT 0.4 06/25/2012 1651   GFRNONAA 83* 06/25/2012 1651   GFRAA >90 06/25/2012 1651    No results found for: SPEP, UPEP  Lab Results  Component Value Date   WBC 5.2 04/06/2015   NEUTROABS 2.3 04/06/2015   HGB 12.4 04/06/2015   HCT 36.5 04/06/2015   MCV 95.1 04/06/2015   PLT 167 04/06/2015      Chemistry      Component Value Date/Time   NA 141 04/06/2015 1507   NA 140 06/25/2012 1651   K 3.6 04/06/2015 1507   K 4.4 06/25/2012 1651   CL 106 06/25/2012 1651   CO2 24 04/06/2015 1507   CO2 27 06/25/2012 1651   BUN 14.4 04/06/2015 1507   BUN 15 09/10/2012 1638   CREATININE 0.9 04/06/2015 1507   CREATININE 0.9 09/10/2012 1638   CREATININE 0.86 05/12/2006 1511      Component Value Date/Time   CALCIUM 8.7 04/06/2015 1507   CALCIUM 9.2 06/25/2012 1651   ALKPHOS 90 04/06/2015 1507   ALKPHOS 83 06/25/2012 1651   AST 15 04/06/2015 1507   AST 13 06/25/2012 1651   ALT 14 04/06/2015 1507   ALT 11 06/25/2012 1651   BILITOT 0.69 04/06/2015 1507   BILITOT 0.4 06/25/2012 1651       RADIOGRAPHIC STUDIES:I reviewed imaging study with her I have personally reviewed the  radiological images as listed and agreed with the findings in the report.    ASSESSMENT & PLAN:  History of B-cell lymphoma I reassured the patient had a PET/CT scan was negative. I will see her back in 8 months for repeat history, physical examination and blood work for her lymphoma follow-up.      Dysphagia She had recent dysphagia, sensation of sore throat and abnormal imaging in her throat and thyroid region. She requested ENT evaluation and will proceed to refer her  Chronic musculoskeletal pain She had recent fall with abnormal imaging study. Overall, she did not have any major injury. Observe only.  TOBACCO DEPENDENCE I spent some time counseling the patient the importance  of tobacco cessation. she is currently attempting to quit on her own       Orders Placed This Encounter  Procedures  . CBC with Differential/Platelet    Standing Status: Future     Number of Occurrences:      Standing Expiration Date: 05/10/2016  . Comprehensive metabolic panel    Standing Status: Future     Number of Occurrences:      Standing Expiration Date: 05/10/2016  . Ambulatory referral to ENT    Referral Priority:  Routine    Referral Type:  Consultation    Referral Reason:  Specialty Services Required    Referred to Provider:  Rozetta Nunnery, MD    Requested Specialty:  Otolaryngology    Number of Visits Requested:  1   All questions were answered. The patient knows to call the clinic with any problems, questions or concerns. No barriers to learning was detected. I spent 15 minutes counseling the patient face to face. The total time spent in the appointment was 20 minutes and more than 50% was on counseling and review of test results     [facility], [place], MD 04/06/2015 4:35 PM

## 2015-04-06 NOTE — Assessment & Plan Note (Signed)
She had recent fall with abnormal imaging study. Overall, she did not have any major injury. Observe only.

## 2015-04-06 NOTE — Assessment & Plan Note (Signed)
She had recent dysphagia, sensation of sore throat and abnormal imaging in her throat and thyroid region. She requested ENT evaluation and will proceed to refer her

## 2015-04-06 NOTE — Assessment & Plan Note (Signed)
I reassured the patient had a PET/CT scan was negative. I will see her back in 8 months for repeat history, physical examination and blood work for her lymphoma follow-up.

## 2015-04-07 DIAGNOSIS — E041 Nontoxic single thyroid nodule: Secondary | ICD-10-CM | POA: Diagnosis not present

## 2015-04-07 DIAGNOSIS — R490 Dysphonia: Secondary | ICD-10-CM | POA: Diagnosis not present

## 2015-04-10 ENCOUNTER — Telehealth: Payer: Self-pay | Admitting: Internal Medicine

## 2015-04-10 NOTE — Telephone Encounter (Signed)
Pt called in said that she has been more stressed out lately and wants to see if she can refill her LORazepam (ATIVAN) 1 MG tablet [id number]  Early ?

## 2015-04-10 NOTE — Telephone Encounter (Signed)
Patient aware. She will make an appt to be seen for extra stress

## 2015-04-10 NOTE — Telephone Encounter (Signed)
No, this would be dangerous.

## 2015-04-14 ENCOUNTER — Encounter: Payer: Self-pay | Admitting: Internal Medicine

## 2015-04-14 ENCOUNTER — Ambulatory Visit (INDEPENDENT_AMBULATORY_CARE_PROVIDER_SITE_OTHER): Payer: Medicare Other | Admitting: Internal Medicine

## 2015-04-14 VITALS — BP 136/58 | HR 109 | Temp 98.5°F | Resp 18 | Ht 62.0 in | Wt 142.0 lb

## 2015-04-14 DIAGNOSIS — F411 Generalized anxiety disorder: Secondary | ICD-10-CM

## 2015-04-14 NOTE — Progress Notes (Signed)
   Subjective:    Patient ID: [name], female    DOB: [birth year], 80 y.o.   MRN: [id number]  HPI The patient is a 80 YO female coming in for follow up of her anxiety. She is going through extra stress right now due to her husband recently being diagnosed with cancer. She had taken extra lorazepam and ran out early. She was upset that we would not fill the medicine early. She wants to fill it today. She is having problems every day. She is also upset that we decreased from 2 mg to 1 mg of the lorazepam. She thinks that it is her son's fault for coming to the last visit. She does not want him to receive any more information about her health.   Review of Systems  Constitutional: Positive for activity change and appetite change. Negative for fever, chills, fatigue and unexpected weight change.  Respiratory: Negative for cough, chest tightness, shortness of breath and wheezing.   Cardiovascular: Negative for chest pain, palpitations and leg swelling.  Gastrointestinal: Positive for constipation. Negative for nausea, abdominal pain, diarrhea, blood in stool and abdominal distention.  Musculoskeletal: Positive for myalgias, back pain and arthralgias. Negative for gait problem.  Neurological: Positive for weakness. Negative for dizziness, light-headedness, numbness and headaches.  Psychiatric/Behavioral: Positive for sleep disturbance, dysphoric mood and decreased concentration. Negative for suicidal ideas, self-injury and agitation. The patient is not nervous/anxious and is not hyperactive.        Sleeping less      Objective:   Physical Exam  Constitutional: She is oriented to person, place, and time. She appears well-developed and well-nourished.  HENT:  Head: Normocephalic and atraumatic.  Eyes: EOM are normal.  Neck: Normal range of motion.  Cardiovascular: Normal rate.   Pulmonary/Chest: Effort normal and breath sounds normal. No respiratory distress. She has no wheezes.  Abdominal:  Soft. She exhibits no distension. There is no tenderness. There is no rebound.  Musculoskeletal: She exhibits no edema.  Neurological: She is alert and oriented to person, place, and time.  Slow gait  Skin: Skin is warm and dry.   Filed Vitals:   04/14/15 1417  BP: 136/58  Pulse: 109  Temp: 98.5 F (36.9 C)  TempSrc: Oral  Resp: 18  Height: 5\' 2"  (1.575 m)  Weight: 142 lb (64.411 kg)  SpO2: 98%      Assessment & Plan:

## 2015-04-14 NOTE — Assessment & Plan Note (Signed)
I would not recommend increasing her lorazepam back to 2 mg as it was not the input of her son but the safety of the medication that caused the decrease. She was advised that she has refills at the pharmacy and she should talk to them about filling the medicine. She was also advised that we should increase her remeron but she does not want to right now.

## 2015-04-14 NOTE — Progress Notes (Signed)
Pre visit review using our clinic review tool, if applicable. No additional management support is needed unless otherwise documented below in the visit note. 

## 2015-04-14 NOTE — Patient Instructions (Signed)
We have given you the form to fill out so you can list who is able to get information about your health.   If ever you do not feel comfortable with someone in your visit please stop [place] as our duty is to you, our patient. We would be happy to ask someone to wait in the lobby while we have our visit.

## 2015-04-15 ENCOUNTER — Other Ambulatory Visit (HOSPITAL_COMMUNITY): Payer: Self-pay | Admitting: Otolaryngology

## 2015-04-15 ENCOUNTER — Other Ambulatory Visit (HOSPITAL_COMMUNITY): Payer: Self-pay

## 2015-04-15 DIAGNOSIS — E041 Nontoxic single thyroid nodule: Secondary | ICD-10-CM

## 2015-04-22 ENCOUNTER — Ambulatory Visit (HOSPITAL_COMMUNITY)
Admission: RE | Admit: 2015-04-22 | Discharge: 2015-04-22 | Disposition: A | Discharge status: Home / Self Care | Payer: Medicare Other | Source: Ambulatory Visit | Attending: Otolaryngology | Admitting: Otolaryngology

## 2015-04-22 DIAGNOSIS — E041 Nontoxic single thyroid nodule: Secondary | ICD-10-CM

## 2015-04-22 DIAGNOSIS — E042 Nontoxic multinodular goiter: Secondary | ICD-10-CM | POA: Diagnosis not present

## 2015-05-05 ENCOUNTER — Other Ambulatory Visit (HOSPITAL_COMMUNITY): Payer: Self-pay | Admitting: Otolaryngology

## 2015-05-05 ENCOUNTER — Other Ambulatory Visit: Payer: Self-pay | Admitting: Otolaryngology

## 2015-05-05 DIAGNOSIS — E041 Nontoxic single thyroid nodule: Secondary | ICD-10-CM

## 2015-05-06 ENCOUNTER — Other Ambulatory Visit: Payer: Self-pay | Admitting: Otolaryngology

## 2015-05-06 DIAGNOSIS — E041 Nontoxic single thyroid nodule: Secondary | ICD-10-CM

## 2015-06-04 ENCOUNTER — Other Ambulatory Visit: Payer: Self-pay | Admitting: Family

## 2015-06-04 ENCOUNTER — Ambulatory Visit (INDEPENDENT_AMBULATORY_CARE_PROVIDER_SITE_OTHER): Payer: Medicare Other | Admitting: Family

## 2015-06-04 ENCOUNTER — Encounter: Payer: Self-pay | Admitting: Family

## 2015-06-04 VITALS — BP 116/64 | HR 100 | Temp 97.9°F | Ht 62.0 in | Wt 141.8 lb

## 2015-06-04 DIAGNOSIS — M4806 Spinal stenosis, lumbar region: Secondary | ICD-10-CM | POA: Diagnosis not present

## 2015-06-04 DIAGNOSIS — M5136 Other intervertebral disc degeneration, lumbar region: Secondary | ICD-10-CM | POA: Diagnosis not present

## 2015-06-04 DIAGNOSIS — M25562 Pain in left knee: Secondary | ICD-10-CM | POA: Diagnosis not present

## 2015-06-04 DIAGNOSIS — F329 Major depressive disorder, single episode, unspecified: Secondary | ICD-10-CM

## 2015-06-04 DIAGNOSIS — M25561 Pain in right knee: Secondary | ICD-10-CM | POA: Diagnosis not present

## 2015-06-04 DIAGNOSIS — F32A Depression, unspecified: Secondary | ICD-10-CM

## 2015-06-04 MED ORDER — CITALOPRAM HYDROBROMIDE 10 MG PO TABS: 10.0000 mg | ORAL_TABLET | Freq: Every day | ORAL | Status: DC

## 2015-06-04 NOTE — Patient Instructions (Signed)
Trial of antidepressant  Our hope is for gradual improvement of mood since starting medication; however this may take several weeks.   If you start to have unusual thoughts, thoughts of hurting yourself, or anyone else, please go immediately to the emergency department.   Follow up in one month.    National Suicide Prevention Hotline - available 24 hours a day, 7 days a week.  (631)846-6639  Major Depressive Disorder Major depressive disorder is a mental illness. It also may be called clinical depression or unipolar depression. Major depressive disorder usually causes feelings of sadness, hopelessness, or helplessness. Some people with this disorder do not feel particularly sad but lose interest in doing things they used to enjoy (anhedonia). Major depressive disorder also can cause physical symptoms. It can interfere with work, school, relationships, and other normal everyday activities. The disorder varies in severity but is longer lasting and more serious than the sadness we all feel from time to time in our lives. Major depressive disorder often is triggered by stressful life events or major life changes. Examples of these triggers include divorce, loss of your job or home, a move, and the death of a family member or close friend. Sometimes this disorder occurs for no obvious reason at all. People who have family members with major depressive disorder or bipolar disorder are at higher risk for developing this disorder, with or without life stressors. Major depressive disorder can occur at any age. It may occur just once in your life (single episode major depressive disorder). It may occur multiple times (recurrent major depressive disorder). SYMPTOMS People with major depressive disorder have either anhedonia or depressed mood on nearly a daily basis for at least 2 weeks or longer. Symptoms of depressed mood include:  Feelings of sadness (blue or down in the dumps) or emptiness.  Feelings of  hopelessness or helplessness.  Tearfulness or episodes of crying (may be observed by others).  Irritability (children and adolescents). In addition to depressed mood or anhedonia or both, people with this disorder have at least four of the following symptoms:  Difficulty sleeping or sleeping too much.   Significant change (increase or decrease) in appetite or weight.   Lack of energy or motivation.  Feelings of guilt and worthlessness.   Difficulty concentrating, remembering, or making decisions.  Unusually slow movement (psychomotor retardation) or restlessness (as observed by others).   Recurrent wishes for death, recurrent thoughts of self-harm (suicide), or a suicide attempt. People with major depressive disorder commonly have persistent negative thoughts about themselves, other people, and the world. People with severe major depressive disorder may experiencedistorted beliefs or perceptions about the world (psychotic delusions). They also may see or hear things that are not real (psychotic hallucinations). DIAGNOSIS Major depressive disorder is diagnosed through an assessment by your health care provider. Your health care provider will ask aboutaspects of your daily life, such as mood,sleep, and appetite, to see if you have the diagnostic symptoms of major depressive disorder. Your health care provider may ask about your medical history and use of alcohol or drugs, including prescription medicines. Your health care provider also may do a physical exam and blood work. This is because certain medical conditions and the use of certain substances can cause major depressive disorder-like symptoms (secondary depression). Your health care provider also may refer you to a mental health specialist for further evaluation and treatment. TREATMENT It is important to recognize the symptoms of major depressive disorder and seek treatment. The following treatments can be  prescribed for this  disorder:   Medicine. Antidepressant medicines usually are prescribed. Antidepressant medicines are thought to correct chemical imbalances in the brain that are commonly associated with major depressive disorder. Other types of medicine may be added if the symptoms do not respond to antidepressant medicines alone or if psychotic delusions or hallucinations occur.  Talk therapy. Talk therapy can be helpful in treating major depressive disorder by providing support, education, and guidance. Certain types of talk therapy also can help with negative thinking (cognitive behavioral therapy) and with relationship issues that trigger this disorder (interpersonal therapy). A mental health specialist can help determine which treatment is best for you. Most people with major depressive disorder do well with a combination of medicine and talk therapy. Treatments involving electrical stimulation of the brain can be used in situations with extremely severe symptoms or when medicine and talk therapy do not work over time. These treatments include electroconvulsive therapy, transcranial magnetic stimulation, and vagal nerve stimulation.   This information is not intended to replace advice given to you by your health care provider. Make sure you discuss any questions you have with your health care provider.   Document Released: 05/07/2012 Document Revised: 01/31/2014 Document Reviewed: 05/07/2012 Elsevier Interactive Patient Education [employer].

## 2015-06-04 NOTE — Progress Notes (Signed)
Pre visit review using our clinic review tool, if applicable. No additional management support is needed unless otherwise documented below in the visit note. 

## 2015-06-04 NOTE — Progress Notes (Signed)
Subjective:    Patient ID: [name], female    DOB: 06-29-1935, 80 y.o.   MRN: [id number]   GALENA BRIGGS is a 80 y.o. female who presents today for an acute visit.    HPI Comments: Patient evaluation of depression.Husband died last week of cancer. Endorses lost of interest in doing things. Granddaughter lives with her.Stays involved with church. Sleeps approx 3-4 hrs at a time. Eating and drinking well.Denies negative thoughts of hurting oneself or another person.  History of anxiety and depression. Prescribed Cymbalta ( also for chronic pain management) and Zoloft in the past however hasn't taken either due to concern of side effects. Of note, her ativan has been decreased from prior dosage years ago; patient feels that ativan would help her symptoms.   Past Medical History  Diagnosis Date  . Duodenitis   . Gastritis   . GERD (gastroesophageal reflux disease)   . Chronic diarrhea   . IBS (irritable bowel syndrome)   . Internal hemorrhoids   . Monoclonal gammopathy   . Urge incontinence   . Benign hypertension   . DJD (degenerative joint disease)   . Depressive disorder   . Anxiety   . Hyperlipidemia   . Vitamin B12 deficiency   . Constipation   . CAD (coronary artery disease)   . Anemia   . Lymphadenopathy 10/22/2012  . Follicular lymphoma ([place]) 10/07/2013  . Insomnia 02/28/2014  . Sore throat 04/06/2015  . Abnormal imaging of thyroid 04/06/2015  . Dysphagia 04/06/2015   Allergies: Review of patient's allergies indicates no known allergies. Current Outpatient Prescriptions on File Prior to Visit  Medication Sig Dispense Refill  . aspirin 81 MG tablet Take 81 mg by mouth daily.      . clopidogrel (PLAVIX) 75 MG tablet Take 75 mg by mouth daily.    . diclofenac sodium (VOLTAREN) 1 % GEL Apply 2 g topically 3 (three) times daily as needed (pain). 100 g 0  . famotidine (PEPCID) 20 MG tablet Take 1 tablet (20 mg total) by mouth 2 (two) times daily. 180 tablet 3  .  HYDROcodone-acetaminophen (NORCO) 10-325 MG per tablet Take 1-2 tablets by mouth every 4 (four) hours as needed for pain.     [name] LORazepam (ATIVAN) 1 MG tablet TAKE 1 TABLET BY MOUTH TWICE DAILY AS NEEDED FOR ANXIETY 60 tablet 2  . losartan (COZAAR) 100 MG tablet Take 100 mg by mouth 2 (two) times daily.     . mirtazapine (REMERON) 15 MG tablet Take 1 tablet (15 mg total) by mouth at bedtime. 30 tablet 6  . nitroGLYCERIN (NITROLINGUAL) 0.4 MG/SPRAY spray Place 1 spray under the tongue every 5 (five) minutes x 3 doses as needed for chest pain.    . polyethylene glycol powder (GLYCOLAX/MIRALAX) powder Take 17 g by mouth daily. 500 g 11  . potassium chloride (K-DUR) 10 MEQ tablet Take 10 mEq by mouth 2 (two) times daily.   2   No current facility-administered medications on file prior to visit.    Social History  Substance Use Topics  . Smoking status: Current Every Day Smoker -- 0.50 packs/day for 50 years    Types: Cigarettes  . Smokeless tobacco: Never Used  . Alcohol Use: No    Review of Systems  Constitutional: Positive for activity change and unexpected weight change. Negative for fever and chills.      Objective:    BP 116/64 mmHg  Pulse 100  Temp(Src) 97.9 F (36.6 C) (  Oral)  Ht 5\' 2"  (1.575 m)  Wt 141 lb 12 oz (64.297 kg)  BMI 25.92 kg/m2  SpO2 99%   Physical Exam  Constitutional: She appears well-developed and well-nourished.  Eyes: Conjunctivae are normal.  Cardiovascular: Normal rate, regular rhythm, normal heart sounds and normal pulses.   Pulmonary/Chest: Effort normal and breath sounds normal. She has no wheezes. She has no rhonchi. She has no rales.  Neurological: She is alert.  Skin: Skin is warm and dry.  Psychiatric: She has a normal mood and affect. Her speech is normal and behavior is normal. Thought content normal.  Vitals reviewed.      Assessment & Plan:   1. Depression Trial of antidepressant.  F/u 4 weeks.  Politely declined referral to  counselor at this time. - citalopram (CELEXA) 10 MG tablet; Take 1 tablet (10 mg total) by mouth daily.  Dispense: 30 tablet; Refill: 1    I am having Ms. Lorber maintain her aspirin, HYDROcodone-acetaminophen, clopidogrel, losartan, nitroGLYCERIN, potassium chloride, polyethylene glycol powder, famotidine, diclofenac sodium, LORazepam, and mirtazapine.   No orders of the defined types were placed in this encounter.     Start medications as prescribed and explained to patient on After Visit Summary ( AVS). Risks, benefits, and alternatives of the medications and treatment plan prescribed today were discussed, and patient expressed understanding.   Education regarding symptom management and diagnosis given to patient.   Follow-up:Plan follow-up as discussed or as needed if any worsening symptoms or change in condition.   Continue to follow with Hoyt Koch, MD for routine health maintenance.   [name] and I agreed with plan.   Mable Paris, FNP

## 2015-06-15 ENCOUNTER — Ambulatory Visit: Payer: Medicare Other | Admitting: Internal Medicine

## 2015-06-17 DIAGNOSIS — I10 Essential (primary) hypertension: Secondary | ICD-10-CM | POA: Diagnosis not present

## 2015-06-17 DIAGNOSIS — I2510 Atherosclerotic heart disease of native coronary artery without angina pectoris: Secondary | ICD-10-CM | POA: Diagnosis not present

## 2015-06-17 DIAGNOSIS — R072 Precordial pain: Secondary | ICD-10-CM | POA: Diagnosis not present

## 2015-06-17 DIAGNOSIS — J449 Chronic obstructive pulmonary disease, unspecified: Secondary | ICD-10-CM | POA: Diagnosis not present

## 2015-06-23 ENCOUNTER — Telehealth: Payer: Self-pay | Admitting: Internal Medicine

## 2015-06-23 NOTE — Telephone Encounter (Signed)
Would recommend zyrtec or the generic which is called cetirizine.

## 2015-06-23 NOTE — Telephone Encounter (Signed)
Pt called called in said nose is stopped up and but not sure what she can take over the counter for it because of all her other meds.  Can you call her when you get a chance???

## 2015-06-23 NOTE — Telephone Encounter (Signed)
Patient aware.

## 2015-07-09 ENCOUNTER — Encounter: Payer: Self-pay | Admitting: Internal Medicine

## 2015-07-09 ENCOUNTER — Ambulatory Visit (INDEPENDENT_AMBULATORY_CARE_PROVIDER_SITE_OTHER): Payer: Medicare Other | Admitting: Internal Medicine

## 2015-07-09 VITALS — BP 116/74 | HR 109 | Temp 98.1°F | Resp 18 | Ht 62.0 in | Wt 142.1 lb

## 2015-07-09 DIAGNOSIS — F32A Depression, unspecified: Secondary | ICD-10-CM

## 2015-07-09 DIAGNOSIS — B070 Plantar wart: Secondary | ICD-10-CM | POA: Diagnosis not present

## 2015-07-09 DIAGNOSIS — F411 Generalized anxiety disorder: Secondary | ICD-10-CM | POA: Diagnosis not present

## 2015-07-09 DIAGNOSIS — F329 Major depressive disorder, single episode, unspecified: Secondary | ICD-10-CM | POA: Diagnosis not present

## 2015-07-09 MED ORDER — LORAZEPAM 1 MG PO TABS: 1.0000 mg | ORAL_TABLET | Freq: Two times a day (BID) | ORAL | Status: DC | PRN

## 2015-07-09 NOTE — Assessment & Plan Note (Signed)
Okay to come off celexa. Maintain remeron and refill ativan today.

## 2015-07-09 NOTE — Progress Notes (Signed)
   Subjective:    Patient ID: [name], female    DOB: 03/28/1935, 80 y.o.   MRN: [id number]  HPI The patient is a 80 YO female coming in for follow up of her depression. She was started on celexa and had side effects. She took the medicine for 2 days. Is out of her ativan and still resents being cut down on dose. She is taking remeron and is not sure it is doing anything. No crying spells. Getting over the recent loss of her husband (was ill for some time). No panic attacks. No SI/HI.   Review of Systems  Constitutional: Positive for activity change and appetite change. Negative for fever, chills, fatigue and unexpected weight change.  Respiratory: Negative for cough, chest tightness, shortness of breath and wheezing.   Cardiovascular: Negative for chest pain, palpitations and leg swelling.  Gastrointestinal: Negative for nausea, abdominal pain, diarrhea, blood in stool and abdominal distention.  Musculoskeletal: Positive for back pain and arthralgias. Negative for gait problem.  Neurological: Negative for dizziness, weakness, light-headedness, numbness and headaches.  Psychiatric/Behavioral: Positive for sleep disturbance, dysphoric mood and decreased concentration. Negative for suicidal ideas, self-injury and agitation. The patient is not nervous/anxious and is not hyperactive.        Sleeping less      Objective:   Physical Exam  Constitutional: She is oriented to person, place, and time. She appears well-developed and well-nourished.  HENT:  Head: Normocephalic and atraumatic.  Eyes: EOM are normal.  Neck: Normal range of motion.  Cardiovascular: Normal rate.   Pulmonary/Chest: Effort normal and breath sounds normal. No respiratory distress. She has no wheezes.  Abdominal: Soft. She exhibits no distension. There is no tenderness. There is no rebound.  Musculoskeletal: She exhibits no edema.  Neurological: She is alert and oriented to person, place, and time.  Slow gait  Skin:  Skin is warm and dry.  Psychiatric:  Slightly flat, appropriate.    Filed Vitals:   07/09/15 1537  BP: 116/74  Pulse: 109  Temp: 98.1 F (36.7 C)  TempSrc: Oral  Resp: 18  Height: 5\' 2"  (1.575 m)  Weight: 142 lb 1.9 oz (64.465 kg)  SpO2: 98%      Assessment & Plan:

## 2015-07-09 NOTE — Patient Instructions (Signed)
We will have the podiatrist call you to get in with Dr. Paulla Dolly.   We have refilled the lorazepam today and sent it in to the pharmacy.

## 2015-07-09 NOTE — Progress Notes (Signed)
Pre visit review using our clinic review tool, if applicable. No additional management support is needed unless otherwise documented below in the visit note. 

## 2015-07-12 NOTE — Assessment & Plan Note (Signed)
Taking ativan and remeron. Not fully controlled but some of this is also adjustment from loss of spouse recently. Okay with continuing but will not increase dose of ativan.

## 2015-07-29 ENCOUNTER — Ambulatory Visit
Admission: RE | Admit: 2015-07-29 | Discharge: 2015-07-29 | Disposition: A | Discharge status: Home / Self Care | Payer: Medicare Other | Source: Ambulatory Visit | Attending: Otolaryngology | Admitting: Otolaryngology

## 2015-07-29 ENCOUNTER — Other Ambulatory Visit (HOSPITAL_COMMUNITY)
Admission: RE | Admit: 2015-07-29 | Discharge: 2015-07-29 | Disposition: A | Discharge status: Home / Self Care | Payer: Medicare Other | Source: Ambulatory Visit | Attending: Diagnostic Radiology | Admitting: Diagnostic Radiology

## 2015-07-29 DIAGNOSIS — E041 Nontoxic single thyroid nodule: Secondary | ICD-10-CM

## 2015-07-29 DIAGNOSIS — E042 Nontoxic multinodular goiter: Secondary | ICD-10-CM | POA: Diagnosis not present

## 2015-07-29 DIAGNOSIS — E079 Disorder of thyroid, unspecified: Secondary | ICD-10-CM | POA: Diagnosis not present

## 2015-08-04 ENCOUNTER — Ambulatory Visit: Payer: Self-pay | Admitting: Podiatry

## 2015-08-07 ENCOUNTER — Ambulatory Visit: Payer: Self-pay | Admitting: Podiatry

## 2015-08-07 ENCOUNTER — Other Ambulatory Visit: Payer: Self-pay | Admitting: Family

## 2015-08-10 NOTE — Telephone Encounter (Signed)
Duplicate request; medication discontinued.

## 2015-08-11 DIAGNOSIS — H903 Sensorineural hearing loss, bilateral: Secondary | ICD-10-CM | POA: Diagnosis not present

## 2015-08-11 DIAGNOSIS — E041 Nontoxic single thyroid nodule: Secondary | ICD-10-CM | POA: Diagnosis not present

## 2015-08-14 ENCOUNTER — Ambulatory Visit: Payer: Medicare Other | Admitting: Internal Medicine

## 2015-08-20 ENCOUNTER — Ambulatory Visit: Payer: Self-pay | Admitting: Podiatry

## 2015-08-20 ENCOUNTER — Ambulatory Visit (INDEPENDENT_AMBULATORY_CARE_PROVIDER_SITE_OTHER): Payer: Medicare Other | Admitting: Internal Medicine

## 2015-08-20 DIAGNOSIS — F411 Generalized anxiety disorder: Secondary | ICD-10-CM

## 2015-08-20 NOTE — Patient Instructions (Signed)
We will not check any labs today and will not change the medicines.

## 2015-08-20 NOTE — Progress Notes (Signed)
Pre visit review using our clinic review tool, if applicable. No additional management support is needed unless otherwise documented below in the visit note. 

## 2015-08-21 NOTE — Assessment & Plan Note (Signed)
Taking remeron which is helping with her sleep and some of the attention. She is improving overall. Continue the lorazepam for now and she is aware of the risks and possible harms from long term benzo usage.

## 2015-08-21 NOTE — Progress Notes (Signed)
   Subjective:    Patient ID: [name], female    DOB: 1935/05/20, 80 y.o.   MRN: [id number]  HPI The patient is a 80 YO female coming in for follow up of her mood. She is working through some depression and feels that she is doing some better. She is grieving her husband still. Use lorazepam as needed and does not feel that it affects her negatively. No sleep problems. No SI/HI.   Review of Systems  Constitutional: Negative for activity change, appetite change, chills, fatigue, fever and unexpected weight change.  Respiratory: Negative for cough, chest tightness, shortness of breath and wheezing.   Cardiovascular: Negative for chest pain, palpitations and leg swelling.  Gastrointestinal: Negative for abdominal distention, abdominal pain, blood in stool, diarrhea and nausea.  Musculoskeletal: Positive for arthralgias and back pain. Negative for gait problem.  Neurological: Negative for dizziness, weakness, light-headedness, numbness and headaches.  Psychiatric/Behavioral: Positive for dysphoric mood. Negative for agitation, decreased concentration, self-injury, sleep disturbance and suicidal ideas. The patient is not nervous/anxious and is not hyperactive.        Sleeping less      Objective:   Physical Exam  Constitutional: She is oriented to person, place, and time. She appears well-developed and well-nourished.  HENT:  Head: Normocephalic and atraumatic.  Eyes: EOM are normal.  Neck: Normal range of motion.  Cardiovascular: Normal rate.   Pulmonary/Chest: Effort normal and breath sounds normal. No respiratory distress. She has no wheezes.  Abdominal: Soft. She exhibits no distension. There is no tenderness. There is no rebound.  Musculoskeletal: She exhibits no edema.  Neurological: She is alert and oriented to person, place, and time.  Slow gait  Skin: Skin is warm and dry.  Psychiatric:  Affect better   Vitals:   08/20/15 1554  BP: (!) 154/82  Pulse: (!) 105  Resp: 18   Temp: 98.3 F (36.8 C)  TempSrc: Oral  SpO2: 97%  Weight: 150 lb (68 kg)      Assessment & Plan:

## 2015-08-26 ENCOUNTER — Encounter: Payer: Self-pay | Admitting: Podiatry

## 2015-08-26 ENCOUNTER — Ambulatory Visit: Payer: Self-pay | Admitting: Podiatry

## 2015-08-26 ENCOUNTER — Ambulatory Visit (INDEPENDENT_AMBULATORY_CARE_PROVIDER_SITE_OTHER): Payer: Medicare Other | Admitting: Podiatry

## 2015-08-26 VITALS — BP 82/57 | HR 70 | Resp 16 | Ht 62.0 in | Wt 150.0 lb

## 2015-08-26 DIAGNOSIS — Q828 Other specified congenital malformations of skin: Secondary | ICD-10-CM

## 2015-08-26 DIAGNOSIS — B351 Tinea unguium: Secondary | ICD-10-CM | POA: Diagnosis not present

## 2015-08-26 DIAGNOSIS — M779 Enthesopathy, unspecified: Secondary | ICD-10-CM

## 2015-08-26 DIAGNOSIS — M79676 Pain in unspecified toe(s): Secondary | ICD-10-CM | POA: Diagnosis not present

## 2015-08-26 DIAGNOSIS — M79605 Pain in left leg: Secondary | ICD-10-CM | POA: Diagnosis not present

## 2015-08-26 DIAGNOSIS — M2040 Other hammer toe(s) (acquired), unspecified foot: Secondary | ICD-10-CM | POA: Diagnosis not present

## 2015-08-26 DIAGNOSIS — M79604 Pain in right leg: Secondary | ICD-10-CM

## 2015-08-26 MED ORDER — TRIAMCINOLONE ACETONIDE 10 MG/ML IJ SUSP
10.0000 mg | Freq: Once | INTRAMUSCULAR | Status: AC
  Administered 2015-08-26: 10 mg

## 2015-08-26 NOTE — Progress Notes (Signed)
   Subjective:    Patient ID: [name], female    DOB: November 26, 1935, 80 y.o.   MRN: [id number]  HPI Chief Complaint  Patient presents with  . Painful lesion    Bilateral; midfoot-above heel  . Debridement    Bilateral nail trim      Review of Systems  All other systems reviewed and are negative.      Objective:   Physical Exam        Assessment & Plan:

## 2015-08-27 NOTE — Progress Notes (Signed)
Subjective:     Patient ID: [name], female   DOB: 1935/09/14, 80 y.o.   MRN: [id number]  HPI patient presents with severe pain underneath the fifth metatarsal base of both feet with right being worse than left with deep keratotic lesions that are painful when pressed and nucleated and states that she cannot walk with any degree of comfort   Review of Systems  All other systems reviewed and are negative.      Objective:   Physical Exam  Constitutional: She is oriented to person, place, and time.  Cardiovascular: Intact distal pulses.   Musculoskeletal: Normal range of motion.  Neurological: She is oriented to person, place, and time.  Skin: Skin is warm and dry.  Nursing note and vitals reviewed.  neurovascular status intact muscle strength adequate range of motion was diminished subtalar midtarsal joint. Patient's found have deep keratotic nucleated lesions fifth metatarsal right over left with inflammation and fluid around the fifth metatarsal base right. Patient has difficulty with ambulation but was found to have good digital perfusion and is well oriented 3     Assessment:     Lesion secondary to pressure with fluid buildup around the base of fifth metatarsal right over left with keratotic tissue formation    Plan:     H&P and conditions reviewed with patient. At this point I went ahead and I injected the base of the fifth metatarsal right with 3 mg dexamethasone Kenalog 5 mg Xylocaine and did deep debridement of lesions bilateral. Patient is instructed that these will most likely recur at one point and we will see her back as needed

## 2015-08-31 IMAGING — PT NM PET TUM IMG INITIAL (PI) SKULL BASE T - THIGH
5 series · 25 of 25 positions shown · non-contrast
Comparison: 11/08/2007

CLINICAL DATA: Subsequent treatment strategy for lymphoma.

EXAM:
NUCLEAR MEDICINE PET SKULL BASE TO THIGH
FASTING BLOOD GLUCOSE:  Value: 97mg/dl
TECHNIQUE: 17.8 mCi F-18 FDG was injected intravenously. CT data was obtained
and used for attenuation correction and anatomic localization only.
(This was not acquired as a diagnostic CT examination.) Additional
exam technical data entered on technologist worksheet.

[Series 1: pet ac · axial · 3.3mm · 4.69mm/px · z∈[-888,-18]mm · 10 of 267 slices shown]
[im 1/267]
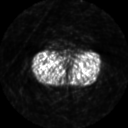
[im 30/267]
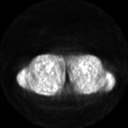
[im 60/267]
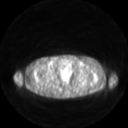
[im 89/267]
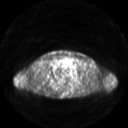
[im 119/267]
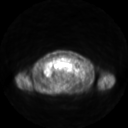
[im 148/267]
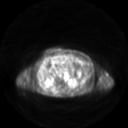
[im 178/267]
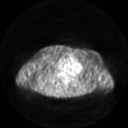
[im 207/267]
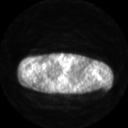
[im 237/267]
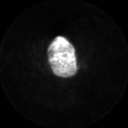
[im 267/267]
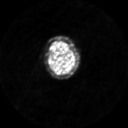

[Series 2: pet nac · axial · 3.3mm · 4.69mm/px · z∈[-888,-18]mm · 11 of 267 slices shown]
[im 1/267]
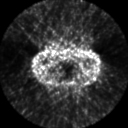
[im 27/267]
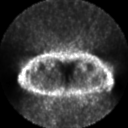
[im 54/267]
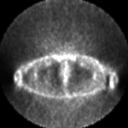
[im 80/267]
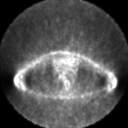
[im 107/267]
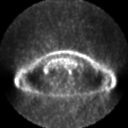
[im 134/267]
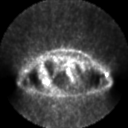
[im 160/267]
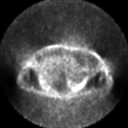
[im 187/267]
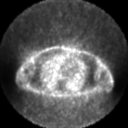
[im 213/267]
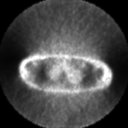
[im 240/267]
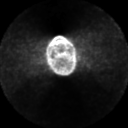
[im 267/267]
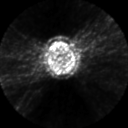

[Series 123: mip · coronal · 3.3mm · 4.69mm/px · 1 of 30 slices shown]
[im 1/30]
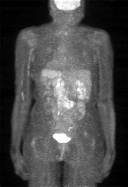

[Series 153: reformatted · coronal · 4.7mm · 6.98mm/px · 2 of 61 slices shown]
[im 1/61]
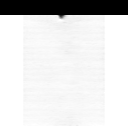
[im 61/61]
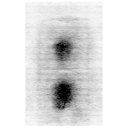

[Series 1080: results mm oncology reading · 1.49mm/px · 1 of 1 slices shown]
[im 1/1]
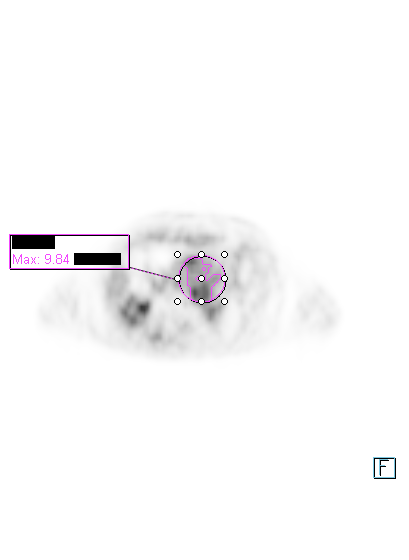

[25 of 25 positions shown; findings below may reference images not displayed]

FINDINGS: NECK

No hypermetabolic lymph nodes in the neck.

CHEST

No hypermetabolic mediastinal or hilar nodes. No suspicious
pulmonary nodules on the CT scan. Prominent Coronary artery
calcifications involve the LAD.

ABDOMEN/PELVIS

No abnormal hypermetabolic activity within the liver, pancreas,
adrenal glands. Proximal periaortic lymph node measures 1.9 cm and
has an SUV max equal to 9.8, image 121/ series 2. More distally
there is a 2.4 cm periaortic lymph node, image 134/ series 2. This
has an SUV max equal to 9.6. Increased FDG uptake associated with
the jejunal mesenteric lymph nodes identified. Index lymph node
measures 1.3 cm in short axis diameter and has an SUV max equal to
11.7, image 158/series 2. There is diffuse abnormal increased
radiotracer uptake throughout this normal-sized spleen.

SKELETON

No focal hypermetabolic activity to suggest skeletal metastasis.
IMPRESSION: 1. Examination is positive for malignant range FDG uptake associated
with retroperitoneal and mesenteric adenopathy within the upper
abdomen. These findings are worrisome for recurrence of lymphoma.

2. Diffuse increased uptake throughout the spleen is identified and
worrisome for splenic involvement by lymphoma.

## 2015-09-17 ENCOUNTER — Emergency Department (HOSPITAL_COMMUNITY)
Admission: EM | Admit: 2015-09-17 | Discharge: 2015-09-18 | Disposition: A | Discharge status: Home / Self Care | Payer: Medicare Other | Attending: Emergency Medicine | Admitting: Emergency Medicine

## 2015-09-17 ENCOUNTER — Encounter (HOSPITAL_COMMUNITY): Payer: Self-pay | Admitting: Emergency Medicine

## 2015-09-17 DIAGNOSIS — I2510 Atherosclerotic heart disease of native coronary artery without angina pectoris: Secondary | ICD-10-CM | POA: Diagnosis not present

## 2015-09-17 DIAGNOSIS — Z7982 Long term (current) use of aspirin: Secondary | ICD-10-CM | POA: Diagnosis not present

## 2015-09-17 DIAGNOSIS — R51 Headache: Secondary | ICD-10-CM | POA: Diagnosis not present

## 2015-09-17 DIAGNOSIS — R519 Headache, unspecified: Secondary | ICD-10-CM

## 2015-09-17 DIAGNOSIS — Z79899 Other long term (current) drug therapy: Secondary | ICD-10-CM | POA: Insufficient documentation

## 2015-09-17 DIAGNOSIS — F17210 Nicotine dependence, cigarettes, uncomplicated: Secondary | ICD-10-CM | POA: Diagnosis not present

## 2015-09-17 DIAGNOSIS — I10 Essential (primary) hypertension: Secondary | ICD-10-CM | POA: Insufficient documentation

## 2015-09-17 DIAGNOSIS — Z955 Presence of coronary angioplasty implant and graft: Secondary | ICD-10-CM | POA: Diagnosis not present

## 2015-09-17 DIAGNOSIS — R52 Pain, unspecified: Secondary | ICD-10-CM | POA: Diagnosis not present

## 2015-09-17 DIAGNOSIS — G4489 Other headache syndrome: Secondary | ICD-10-CM | POA: Diagnosis not present

## 2015-09-17 NOTE — ED Provider Notes (Signed)
[facility] DEPT Provider Note   CSN: [id number] Arrival date & time: 09/17/15  2334  By signing my name below, I, [name], attest that this documentation has been prepared under the direction and in the presence of [name], MD . Electronically Signed: Dolores Guerrero, Scribe. 09/17/2015. 11:49 PM.  History   Chief Complaint Chief Complaint  Patient presents with  . Migraine   The history is provided by the patient and a relative. No language interpreter was used.    HPI Comments:  [name] is a 80 y.o. female with PMHx of CAD, Headaches and HLD who presents to the Emergency Department complaining of worsening recurring headaches onset 8-9 days ago. Pt states that she has had 3 headaches over the past 8 days, and the most recent today was the worst. She notes she took a hydrocodone with mild relief. Pt states she then took some nitroglycerine spray, which worsened her headache. Pt denies any recent fall, weakness, fever, chills, cough, congestion, nausea, vomiting or diarrhea. She also notes that she has been compliant with her blood thinner medications.   Past Medical History:  Diagnosis Date  . Abnormal imaging of thyroid 04/06/2015  . Anemia   . Anxiety   . Benign hypertension   . CAD (coronary artery disease)   . Chronic diarrhea   . Constipation   . Depressive disorder   . DJD (degenerative joint disease)   . Duodenitis   . Dysphagia 04/06/2015  . Follicular lymphoma ([place]) 10/07/2013  . Gastritis   . GERD (gastroesophageal reflux disease)   . Hyperlipidemia   . IBS (irritable bowel syndrome)   . Insomnia 02/28/2014  . Internal hemorrhoids   . Lymphadenopathy 10/22/2012  . Monoclonal gammopathy   . Sore throat 04/06/2015  . Urge incontinence   . Vitamin B12 deficiency     Patient Active Problem List   Diagnosis Date Noted  . Sore throat 04/06/2015  . Abnormal imaging of thyroid 04/06/2015  . Dysphagia 04/06/2015  . Partial thickness burn of wrist  11/18/2014  . Chronic musculoskeletal pain 03/26/2014  . Insomnia 02/28/2014  . Coronary artery calcification seen on CAT scan 12/26/2013  . Angina effort ([place]) 11/08/2013  . History of B-cell lymphoma 10/07/2013  . ALLERGIC RHINITIS 02/08/2008  . B12 DEFICIENCY 10/25/2007  . GERD 03/19/2007  . IBS 03/19/2007  . Headache 03/19/2007  . MONOCLONAL GAMMOPATHY 01/01/2007  . Anxiety state 03/23/2006  . TOBACCO DEPENDENCE 03/23/2006  . Depression 03/23/2006  . HEARING LOSS NOS OR DEAFNESS 03/23/2006  . Essential hypertension, benign 03/23/2006  . DJD, UNSPECIFIED 03/23/2006  . INCONTINENCE, URGE 03/23/2006    Past Surgical History:  Procedure Laterality Date  . ABDOMINAL HYSTERECTOMY  1992   with ovarian cystectomy  . CORONARY STENT PLACEMENT    . HEMICOLECTOMY    . ileocolonic anasomosis    . SPINE SURGERY      OB History    No data available       Home Medications    Prior to Admission medications   Medication Sig Start Date End Date Taking? Authorizing Provider  aspirin 81 MG tablet Take 81 mg by mouth daily.     Yes Historical Provider, MD  clopidogrel (PLAVIX) 75 MG tablet Take 75 mg by mouth daily.   Yes Historical Provider, MD  famotidine (PEPCID) 20 MG tablet Take 1 tablet (20 mg total) by mouth 2 (two) times daily. 09/05/14  Yes Lafayette Dragon, MD  HYDROcodone-acetaminophen [facility]) 10-325 MG per tablet  Take 1-2 tablets by mouth every 4 (four) hours as needed for pain.    Yes Historical Provider, MD  LORazepam (ATIVAN) 1 MG tablet Take 1 tablet (1 mg total) by mouth 2 (two) times daily as needed. for anxiety 07/09/15  Yes Hoyt Koch, MD  losartan (COZAAR) 100 MG tablet Take 100 mg by mouth 2 (two) times daily.  07/31/12  Yes Historical Provider, MD  nitroGLYCERIN (NITROLINGUAL) 0.4 MG/SPRAY spray Place 1 spray under the tongue every 5 (five) minutes x 3 doses as needed for chest pain.   Yes Historical Provider, MD  oxybutynin (DITROPAN) 5 MG tablet Take 5 mg by  mouth daily. 06/14/15  Yes Historical Provider, MD  potassium chloride (K-DUR) 10 MEQ tablet Take 10 mEq by mouth 2 (two) times daily.  01/31/14  Yes Historical Provider, MD  diclofenac sodium (VOLTAREN) 1 % GEL Apply 2 g topically 3 (three) times daily as needed (pain). Patient not taking: Reported on 09/18/2015 11/25/14   Hoyt Koch, MD  mirtazapine (REMERON) 15 MG tablet Take 1 tablet (15 mg total) by mouth at bedtime. Patient not taking: Reported on 09/18/2015 04/06/15   Heath Lark, MD  polyethylene glycol powder (GLYCOLAX/MIRALAX) powder Take 17 g by mouth daily. Patient not taking: Reported on 09/18/2015 09/05/14   Lafayette Dragon, MD    Family History Family History  Problem Relation Age of Onset  . Kidney disease Mother   . Stroke Mother   . Diabetes      sibling  . Colon cancer Neg Hx     Social History Social History  Substance Use Topics  . Smoking status: Current Every Day Smoker    Packs/day: 0.50    Years: 50.00    Types: Cigarettes  . Smokeless tobacco: Never Used  . Alcohol use No     Allergies   Review of patient's allergies indicates no known allergies.   Review of Systems Review of Systems 10 Systems reviewed and are negative for acute change except as noted in the HPI.   Physical Exam Updated Vital Signs BP 134/69   Pulse 75   Temp 98.3 F (36.8 C) (Oral)   Resp 16   Ht 5\' 4"  (1.626 m)   Wt 150 lb (68 kg)   SpO2 100%   BMI 25.75 kg/m   Physical Exam  Constitutional: She is oriented to person, place, and time. She appears well-developed and well-nourished. No distress.  HENT:  Head: Normocephalic and atraumatic.  Eyes: EOM are normal. Pupils are equal, round, and reactive to light.  Neck: Normal range of motion.  Cardiovascular: Normal rate, regular rhythm and normal heart sounds.   Pulmonary/Chest: Effort normal and breath sounds normal.  Abdominal: Soft. She exhibits no distension. There is no tenderness.  Musculoskeletal: Normal  range of motion.  Neurological: She is alert and oriented to person, place, and time.  5/5 strength in major muscle groups of  bilateral upper and lower extremities. Speech normal. No facial asymetry.   Skin: Skin is warm and dry.  Psychiatric: She has a normal mood and affect. Judgment normal.  Nursing note and vitals reviewed.  ED Treatments / Results  DIAGNOSTIC STUDIES:  Oxygen Saturation is 100% on RA, normal by my interpretation.    COORDINATION OF CARE:  3:24 AM Discussed treatment plan with pt at bedside and pt agreed to plan.  Labs (all labs ordered are listed, but only abnormal results are displayed) Labs Reviewed - No data to display  EKG  EKG Interpretation None       Radiology Ct Head Wo Contrast  Result Date: 09/18/2015 CLINICAL DATA:  80 y/o F; increasing headache. History of benign hypertension. EXAM: CT HEAD WITHOUT CONTRAST TECHNIQUE: Contiguous axial images were obtained from the base of the skull through the vertex without intravenous contrast. COMPARISON:  CT head 08/08/2012. FINDINGS: Brain: No evidence of acute infarction, hemorrhage, hydrocephalus, extra-axial collection or mass lesion/mass effect. Mild parenchymal volume loss. Nonspecific white matter lucencies probably representing chronic microvascular ischemic changes. Vascular: No hyperdense vessel. Moderate coronary artery calcifications. Skull: Negative. Sinuses/Orbits: Mild diffuse paranasal sinus mucosal thickening and partial opacification of posterior ethmoid air cells. Mastoid air cells are normally aerated. 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 IMPRESSION: 1. No acute intracranial abnormality is identified. 2. Moderate paranasal sinus disease. 3. Mild chronic microvascular ischemic changes and parenchymal volume loss. Electronically Signed   By: Kristine Garbe M.D.   On: 09/18/2015 02:38    Procedures Procedures (including critical care time)  Medications  Ordered in ED Medications  prochlorperazine (COMPAZINE) injection 10 mg (10 mg Intravenous Given 09/18/15 0041)  sodium chloride 0.9 % bolus 1,000 mL (1,000 mLs Intravenous New Bag/Given 09/18/15 0041)  morphine 4 MG/ML injection 4 mg (4 mg Intravenous Given 09/18/15 0041)     Initial Impression / Assessment and Plan / ED Course  I have reviewed the triage vital signs and the nursing notes.  Pertinent labs & imaging results that were available during my care of the patient were reviewed by me and considered in my medical decision making (see chart for details).  Clinical Course    3:36 AM Patient feels much better this time.  Discharge home.  Head CT normal.  No focal neuro deficit.  Outpatient primary care and neurology follow-up.  She understands to return to the ER for new or worsening symptoms  Final Clinical Impressions(s) / ED Diagnoses   Final diagnoses:  Headache, unspecified headache type    New Prescriptions Current Discharge Medication List      I personally performed the services described in this documentation, which was scribed in my presence. The recorded information has been reviewed and is accurate.        [name], MD 09/18/15 [phone]

## 2015-09-17 NOTE — ED Triage Notes (Signed)
Pt brought to ED by [facility] for 10/10 HA, pt states she is been having intermittent HA for the past 2 days took one Hydrocodone and nitro spray, states that she though that Nitro will help her HA. Pt oriented that nitro is only for CP and it will make HA worse, pt verbalize understanding. [facility] VS BP 180/92 on arrival to home, 174/87 on ED arrival, HR 94, R18, SPO2 95% on RA, PT AO x 4 NAD noticed.

## 2015-09-18 ENCOUNTER — Emergency Department (HOSPITAL_COMMUNITY): Payer: Medicare Other

## 2015-09-18 DIAGNOSIS — R51 Headache: Secondary | ICD-10-CM | POA: Diagnosis not present

## 2015-09-18 MED ORDER — PROCHLORPERAZINE EDISYLATE 5 MG/ML IJ SOLN
10.0000 mg | Freq: Four times a day (QID) | INTRAMUSCULAR | Status: DC | PRN
  Administered 2015-09-18: 10 mg via INTRAVENOUS
  Filled 2015-09-18: qty 2

## 2015-09-18 MED ORDER — MORPHINE SULFATE (PF) 4 MG/ML IV SOLN
4.0000 mg | Freq: Once | INTRAVENOUS | Status: AC
  Administered 2015-09-18: 4 mg via INTRAVENOUS
  Filled 2015-09-18: qty 1

## 2015-09-18 MED ORDER — SODIUM CHLORIDE 0.9 % IV BOLUS (SEPSIS)
1000.0000 mL | Freq: Once | INTRAVENOUS | Status: AC
  Administered 2015-09-18: 1000 mL via INTRAVENOUS

## 2015-09-18 NOTE — ED Notes (Signed)
Patient walked independently to the restroom

## 2015-09-24 ENCOUNTER — Emergency Department (HOSPITAL_COMMUNITY)
Admission: EM | Admit: 2015-09-24 | Discharge: 2015-09-24 | Disposition: A | Discharge status: Home / Self Care | Payer: Medicare Other | Attending: Emergency Medicine | Admitting: Emergency Medicine

## 2015-09-24 ENCOUNTER — Encounter (HOSPITAL_COMMUNITY): Payer: Self-pay | Admitting: Emergency Medicine

## 2015-09-24 DIAGNOSIS — F17210 Nicotine dependence, cigarettes, uncomplicated: Secondary | ICD-10-CM | POA: Diagnosis not present

## 2015-09-24 DIAGNOSIS — I10 Essential (primary) hypertension: Secondary | ICD-10-CM | POA: Insufficient documentation

## 2015-09-24 DIAGNOSIS — Z79899 Other long term (current) drug therapy: Secondary | ICD-10-CM | POA: Diagnosis not present

## 2015-09-24 DIAGNOSIS — G43809 Other migraine, not intractable, without status migrainosus: Secondary | ICD-10-CM | POA: Diagnosis not present

## 2015-09-24 DIAGNOSIS — I2510 Atherosclerotic heart disease of native coronary artery without angina pectoris: Secondary | ICD-10-CM | POA: Insufficient documentation

## 2015-09-24 DIAGNOSIS — Z955 Presence of coronary angioplasty implant and graft: Secondary | ICD-10-CM | POA: Diagnosis not present

## 2015-09-24 DIAGNOSIS — Z7982 Long term (current) use of aspirin: Secondary | ICD-10-CM | POA: Insufficient documentation

## 2015-09-24 DIAGNOSIS — K0889 Other specified disorders of teeth and supporting structures: Secondary | ICD-10-CM | POA: Diagnosis not present

## 2015-09-24 DIAGNOSIS — R51 Headache: Secondary | ICD-10-CM | POA: Diagnosis present

## 2015-09-24 LAB — CBC WITH DIFFERENTIAL/PLATELET
BASOS PCT: 0 %
Basophils Absolute: 0 10*3/uL (ref 0.0–0.1)
Eosinophils Absolute: 0.2 10*3/uL (ref 0.0–0.7)
Eosinophils Relative: 3 %
HEMATOCRIT: 38.5 % (ref 36.0–46.0)
HEMOGLOBIN: 13.2 g/dL (ref 12.0–15.0)
LYMPHS ABS: 1.5 10*3/uL (ref 0.7–4.0)
Lymphocytes Relative: 23 %
MCH: 33.1 pg (ref 26.0–34.0)
MCHC: 34.3 g/dL (ref 30.0–36.0)
MCV: 96.5 fL (ref 78.0–100.0)
MONOS PCT: 7 %
Monocytes Absolute: 0.5 10*3/uL (ref 0.1–1.0)
NEUTROS ABS: 4.4 10*3/uL (ref 1.7–7.7)
NEUTROS PCT: 67 %
Platelets: 187 10*3/uL (ref 150–400)
RBC: 3.99 MIL/uL (ref 3.87–5.11)
RDW: 13.3 % (ref 11.5–15.5)
WBC: 6.6 10*3/uL (ref 4.0–10.5)

## 2015-09-24 LAB — BASIC METABOLIC PANEL
ANION GAP: 7 (ref 5–15)
BUN: 11 mg/dL (ref 6–20)
CHLORIDE: 111 mmol/L (ref 101–111)
CO2: 22 mmol/L (ref 22–32)
Calcium: 9.1 mg/dL (ref 8.9–10.3)
Creatinine, Ser: 0.9 mg/dL (ref 0.44–1.00)
GFR calc non Af Amer: >60 mL/min (ref >60)
Glucose, Bld: 114 mg/dL — ABNORMAL HIGH (ref 65–99)
Potassium: 3.6 mmol/L (ref 3.5–5.1)
Sodium: 140 mmol/L (ref 135–145)

## 2015-09-24 LAB — SEDIMENTATION RATE: Sed Rate: 16 mm/hr (ref 0–22)

## 2015-09-24 MED ORDER — AMOXICILLIN 500 MG PO CAPS: 500.0000 mg | ORAL_CAPSULE | Freq: Three times a day (TID) | ORAL | 0 refills | Status: DC

## 2015-09-24 MED ORDER — MORPHINE SULFATE (PF) 4 MG/ML IV SOLN
4.0000 mg | Freq: Once | INTRAVENOUS | Status: AC
  Administered 2015-09-24: 4 mg via INTRAVENOUS
  Filled 2015-09-24: qty 1

## 2015-09-24 MED ORDER — PROMETHAZINE HCL 25 MG PO TABS: 25.0000 mg | ORAL_TABLET | Freq: Four times a day (QID) | ORAL | 0 refills | Status: DC | PRN

## 2015-09-24 MED ORDER — SODIUM CHLORIDE 0.9 % IV BOLUS (SEPSIS)
500.0000 mL | Freq: Once | INTRAVENOUS | Status: AC
  Administered 2015-09-24: 500 mL via INTRAVENOUS

## 2015-09-24 MED ORDER — PROCHLORPERAZINE EDISYLATE 5 MG/ML IJ SOLN
10.0000 mg | Freq: Once | INTRAMUSCULAR | Status: AC
  Administered 2015-09-24: 10 mg via INTRAVENOUS
  Filled 2015-09-24: qty 2

## 2015-09-24 NOTE — ED Notes (Signed)
Lab called, stated that BMP was hemolyzed, reorder

## 2015-09-24 NOTE — ED Provider Notes (Addendum)
[facility] DEPT Provider Note   CSN: [id number] Arrival date & time: 09/24/15  0518     History   Chief Complaint Chief Complaint  Patient presents with  . Headache  . Dental Pain    HPI [name] is a 80 y.o. female.  Patient presents with headache. Patient reports that pain began yesterday and slowly worsened overnight. This is similar to headaches she has had in the past. She only recently started having these headaches again, however. She reports that she was in the ER recently with similar headache and was treated. Her headache resolved, then returned yesterday. No vision change, neck pain, fever. She does report that she has noticed that she has a tooth that is loose on the right side and thinks that the pain may be coming from this area.      Past Medical History:  Diagnosis Date  . Abnormal imaging of thyroid 04/06/2015  . Anemia   . Anxiety   . Benign hypertension   . CAD (coronary artery disease)   . Chronic diarrhea   . Constipation   . Depressive disorder   . DJD (degenerative joint disease)   . Duodenitis   . Dysphagia 04/06/2015  . Follicular lymphoma ([place]) 10/07/2013  . Gastritis   . GERD (gastroesophageal reflux disease)   . Hyperlipidemia   . IBS (irritable bowel syndrome)   . Insomnia 02/28/2014  . Internal hemorrhoids   . Lymphadenopathy 10/22/2012  . Monoclonal gammopathy   . Sore throat 04/06/2015  . Urge incontinence   . Vitamin B12 deficiency     Patient Active Problem List   Diagnosis Date Noted  . Sore throat 04/06/2015  . Abnormal imaging of thyroid 04/06/2015  . Dysphagia 04/06/2015  . Partial thickness burn of wrist 11/18/2014  . Chronic musculoskeletal pain 03/26/2014  . Insomnia 02/28/2014  . Coronary artery calcification seen on CAT scan 12/26/2013  . Angina effort ([place]) 11/08/2013  . History of B-cell lymphoma 10/07/2013  . ALLERGIC RHINITIS 02/08/2008  . B12 DEFICIENCY 10/25/2007  . GERD 03/19/2007  . IBS 03/19/2007    . Headache 03/19/2007  . MONOCLONAL GAMMOPATHY 01/01/2007  . Anxiety state 03/23/2006  . TOBACCO DEPENDENCE 03/23/2006  . Depression 03/23/2006  . HEARING LOSS NOS OR DEAFNESS 03/23/2006  . Essential hypertension, benign 03/23/2006  . DJD, UNSPECIFIED 03/23/2006  . INCONTINENCE, URGE 03/23/2006    Past Surgical History:  Procedure Laterality Date  . ABDOMINAL HYSTERECTOMY  1992   with ovarian cystectomy  . CORONARY STENT PLACEMENT    . HEMICOLECTOMY    . ileocolonic anasomosis    . SPINE SURGERY      OB History    No data available       Home Medications    Prior to Admission medications   Medication Sig Start Date End Date Taking? Authorizing Provider  aspirin 81 MG tablet Take 81 mg by mouth daily.     Yes Historical Provider, MD  clopidogrel (PLAVIX) 75 MG tablet Take 75 mg by mouth daily.   Yes Historical Provider, MD  famotidine (PEPCID) 20 MG tablet Take 1 tablet (20 mg total) by mouth 2 (two) times daily. 09/05/14  Yes Lafayette Dragon, MD  HYDROcodone-acetaminophen [facility]) 10-325 MG per tablet Take 1-2 tablets by mouth every 4 (four) hours as needed for pain.    Yes Historical Provider, MD  LORazepam (ATIVAN) 1 MG tablet Take 1 tablet (1 mg total) by mouth 2 (two) times daily as needed. for anxiety 07/09/15  Yes Hoyt Koch, MD  losartan (COZAAR) 100 MG tablet Take 100 mg by mouth 2 (two) times daily.  07/31/12  Yes Historical Provider, MD  nitroGLYCERIN (NITROLINGUAL) 0.4 MG/SPRAY spray Place 1 spray under the tongue every 5 (five) minutes x 3 doses as needed for chest pain.   Yes Historical Provider, MD  oxybutynin (DITROPAN) 5 MG tablet Take 5 mg by mouth daily. 06/14/15  Yes Historical Provider, MD  potassium chloride (K-DUR) 10 MEQ tablet Take 10 mEq by mouth 2 (two) times daily.  01/31/14  Yes Historical Provider, MD  amoxicillin (AMOXIL) 500 MG capsule Take 1 capsule (500 mg total) by mouth 3 (three) times daily. 09/24/15   Orpah Greek, MD   promethazine (PHENERGAN) 25 MG tablet Take 1 tablet (25 mg total) by mouth every 6 (six) hours as needed (migraine). 09/24/15   Orpah Greek, MD    Family History Family History  Problem Relation Age of Onset  . Kidney disease Mother   . Stroke Mother   . Diabetes      sibling  . Colon cancer Neg Hx     Social History Social History  Substance Use Topics  . Smoking status: Current Every Day Smoker    Packs/day: 0.50    Years: 50.00    Types: Cigarettes  . Smokeless tobacco: Never Used  . Alcohol use No     Allergies   Review of patient's allergies indicates no known allergies.   Review of Systems Review of Systems  Neurological: Positive for headaches.  All other systems reviewed and are negative.    Physical Exam Updated Vital Signs BP 164/68   Pulse (!) 58   Temp 98.4 F (36.9 C) (Oral)   Resp 16   Ht 5\' 4"  (1.626 m)   Wt 150 lb (68 kg)   SpO2 99%   BMI 25.75 kg/m   Physical Exam  Constitutional: She is oriented to person, place, and time. She appears well-developed and well-nourished. No distress.  HENT:  Head: Normocephalic and atraumatic.  Right Ear: Hearing normal.  Left Ear: Hearing normal.  Nose: Nose normal.  Mouth/Throat: Oropharynx is clear and moist and mucous membranes are normal.  Eyes: Conjunctivae and EOM are normal. Pupils are equal, round, and reactive to light.  Neck: Normal range of motion. Neck supple. No muscular tenderness present. No neck rigidity. Normal range of motion present.  Cardiovascular: Regular rhythm, S1 normal and S2 normal.  Exam reveals no gallop and no friction rub.   No murmur heard. Pulmonary/Chest: Effort normal and breath sounds normal. No respiratory distress. She exhibits no tenderness.  Abdominal: Soft. Normal appearance and bowel sounds are normal. There is no hepatosplenomegaly. There is no tenderness. There is no rebound, no guarding, no tenderness at McBurney's point and negative Murphy's sign.  No hernia.  Musculoskeletal: Normal range of motion.  Neurological: She is alert and oriented to person, place, and time. She has normal strength. No cranial nerve deficit or sensory deficit. Coordination normal. GCS eye subscore is 4. GCS verbal subscore is 5. GCS motor subscore is 6.  Normal strength, sensation, coordination in all 4 extremities  Skin: Skin is warm, dry and intact. No rash noted. No cyanosis.  Psychiatric: She has a normal mood and affect. Her speech is normal and behavior is normal. Thought content normal.  Nursing note and vitals reviewed.    ED Treatments / Results  Labs (all labs ordered are listed, but only abnormal results are displayed) Labs  Reviewed  CBC WITH DIFFERENTIAL/PLATELET  SEDIMENTATION RATE  BASIC METABOLIC PANEL    EKG  EKG Interpretation None       Radiology No results found.  Procedures Procedures (including critical care time)  Medications Ordered in ED Medications  sodium chloride 0.9 % bolus 500 mL (0 mLs Intravenous Stopped 09/24/15 0650)  morphine 4 MG/ML injection 4 mg (4 mg Intravenous Given 09/24/15 0552)  prochlorperazine (COMPAZINE) injection 10 mg (10 mg Intravenous Given 09/24/15 0552)     Initial Impression / Assessment and Plan / ED Course  I have reviewed the triage vital signs and the nursing notes.  Pertinent labs & imaging results that were available during my care of the patient were reviewed by me and considered in my medical decision making (see chart for details).  Clinical Course   Patient presents to the emergency department for evaluation of headache. Patient does have a history of chronic recurrent headaches years ago, but hadn't had any for some time. In the last week or so, however, she has had return of headaches. Patient reports sharp and stabbing pains behind the right eye in the right forehead area. No nausea, vomiting. She has not had any vision change. No neck pain or stiffness. Denies fever. Patient  had CT of head and her previous visit, does not require repeat imaging as her headache is identical to previous. Patient reports a headache is improving after treatment. Await sedimentation rate to rule out temporal arteritis.Sedimentation rate is 16 making Temporal Arteritis unlikely.  Final Clinical Impressions(s) / ED Diagnoses   Final diagnoses:  Other type of migraine  Pain, dental    New Prescriptions New Prescriptions   AMOXICILLIN (AMOXIL) 500 MG CAPSULE    Take 1 capsule (500 mg total) by mouth 3 (three) times daily.   PROMETHAZINE (PHENERGAN) 25 MG TABLET    Take 1 tablet (25 mg total) by mouth every 6 (six) hours as needed (migraine).     Orpah Greek, MD 09/24/15 [place], MD 09/24/15 [phone]

## 2015-09-24 NOTE — ED Notes (Signed)
EDP at bedside  

## 2015-09-24 NOTE — ED Triage Notes (Signed)
Pt in reports HA, seen here other day for similar S/S, pt has "loose tooth" on R lower side which she thinks is causing the HA. No neuro deficits. Denies N/V, photophobia.

## 2015-10-01 ENCOUNTER — Telehealth: Payer: Self-pay | Admitting: Internal Medicine

## 2015-10-01 ENCOUNTER — Encounter: Payer: Self-pay | Admitting: Internal Medicine

## 2015-10-01 ENCOUNTER — Ambulatory Visit (INDEPENDENT_AMBULATORY_CARE_PROVIDER_SITE_OTHER): Payer: Medicare Other | Admitting: Internal Medicine

## 2015-10-01 VITALS — BP 128/82 | HR 96 | Temp 97.7°F | Resp 20 | Wt 147.0 lb

## 2015-10-01 DIAGNOSIS — F411 Generalized anxiety disorder: Secondary | ICD-10-CM

## 2015-10-01 DIAGNOSIS — G43809 Other migraine, not intractable, without status migrainosus: Secondary | ICD-10-CM | POA: Diagnosis not present

## 2015-10-01 DIAGNOSIS — F329 Major depressive disorder, single episode, unspecified: Secondary | ICD-10-CM

## 2015-10-01 DIAGNOSIS — I10 Essential (primary) hypertension: Secondary | ICD-10-CM | POA: Diagnosis not present

## 2015-10-01 DIAGNOSIS — K0889 Other specified disorders of teeth and supporting structures: Secondary | ICD-10-CM | POA: Diagnosis not present

## 2015-10-01 DIAGNOSIS — F32A Depression, unspecified: Secondary | ICD-10-CM

## 2015-10-01 MED ORDER — LORAZEPAM 1 MG PO TABS: 1.0000 mg | ORAL_TABLET | Freq: Two times a day (BID) | ORAL | 2 refills | Status: DC | PRN

## 2015-10-01 MED ORDER — CITALOPRAM HYDROBROMIDE 10 MG PO TABS
10.0000 mg | ORAL_TABLET | Freq: Every day | ORAL | 3 refills | Status: DC
Start: 2015-10-01 — End: 2016-05-26

## 2015-10-01 MED ORDER — BUTALBITAL-APAP-CAFFEINE 50-325-40 MG PO TABS: 1.0000 | ORAL_TABLET | Freq: Every day | ORAL | 0 refills | Status: DC | PRN

## 2015-10-01 NOTE — Assessment & Plan Note (Signed)
stable overall by history and exam, recent data reviewed with pt, and pt to continue medical treatment as before,  to f/u any worsening symptoms or concerns BP Readings from Last 3 Encounters:  10/01/15 128/82  09/24/15 165/75  09/18/15 161/70

## 2015-10-01 NOTE — Assessment & Plan Note (Signed)
Recurring, likely not eligible for imitrex tx, pt does not want preventive tx, ok for limited fioricet prn, no further imaging needed

## 2015-10-01 NOTE — Telephone Encounter (Signed)
Patient Name: [name]  DOB: 1936/01/16    Initial Comment seen in ER 3x in last 2wks, told to see dr. asap. having migraines and headaches.   Nurse Assessment  Nurse: Mallie Mussel, RN, Alveta Heimlich Date/Time Eilene Ghazi Time): 10/01/2015 1:03:49 PM  Confirm and document reason for call. If symptomatic, describe symptoms. You must click the next button to save text entered. ---Caller states she has been in ER for headaches. Was told to follow up with her PCP. She has a headache now. She was prescribed Promethazine and Amoxicillin. She rates her current headache pain as 3-4 on 0-10 scale. She denies vomiting in the last 24 hours. She states that her BP keeps going up. Her current reading is 131/87.  Has the patient traveled out of the country within the last 30 days? ---No  Does the patient have any new or worsening symptoms? ---Yes  Will a triage be completed? ---Yes  Related visit to physician within the last 2 weeks? ---Yes  Does the PT have any chronic conditions? (i.e. diabetes, asthma, etc.) ---Yes  List chronic conditions. ---HTN, Chronic Leg pain, Heart Stints, Reflux, Anxiety, Hypercholesterolemia, Angina, Bladder Spasms  Is this a behavioral health or substance abuse call? ---No     Guidelines    Guideline Title Affirmed Question Affirmed Notes  Headache [1] New headache AND [2] age > 60    Final Disposition User   See Physician within [place], RN, Alveta Heimlich    Comments  Caller was notified that there are appointments available today at 5:45, 6:00, and 6:15 with Dr. Cathlean Cower. She is going to check with her daughter then call us back to schedule an appointment.  Caller is to call back for an appointment.   Referrals  REFERRED TO PCP OFFICE   Disagree/Comply: Comply

## 2015-10-01 NOTE — Patient Instructions (Addendum)
Please take all new medication as prescribed - the generic for Fioricet (for migraine, and only as you need it) - given in hardcopy  Please take all new medication as prescribed - the citalopram 10 mg per day (sent to pharmacy on the computer)  Please continue all other medications as before, and refills have been done if requested. - the lorazepam   Please have the pharmacy call with any other refills you may need.  Please keep your appointments with your specialists as you may have planned  Please see your dentist as soon as possible for the right upper tooth

## 2015-10-01 NOTE — Assessment & Plan Note (Signed)
Mild to mod persistent, "I dont know what I would do without",  for lorazepam refil,  to f/u any worsening symptoms or concerns

## 2015-10-01 NOTE — Assessment & Plan Note (Signed)
Pt to see dental, consider oral surgeon,  to f/u any worsening symptoms or concerns

## 2015-10-01 NOTE — Progress Notes (Signed)
Subjective:    Patient ID: [name], female    DOB: 24-Apr-1935, 80 y.o.   MRN: [id number]  HPI  Here to f/u recent ED visit for migraine and right upper tooth infection aug 25, CT also c/w moderate paranasal sinus disease.  Overall improved sinus pain, pressure and tooth pain improved with amoxil course, denies fever, worsening pain, ST, cough and Pt denies chest pain, increased sob or doe, wheezing, orthopnea, PND, increased LE swelling, palpitations, dizziness or syncope.  Still having recurring right side head and facial pain, throbbing with nausea but no photo or phonophobia, vomiting or worsening with exertion.  HA's occur at least twice per wk for several months.  Recent CT aug 25 neg for acute.  Was suggested to see neuro at ED visit but pt wants to try med tx first.  Husband died 39 mo ago, still grieving, c/o mild worsening depressive symptoms, but no suicidal ideation, or panic; has ongoing anxiety, asks for benzo refill.  Also right upper tooth assoc with infection recently is still somewhat loose, pt debating on what to do, does have a dentist. .  Past Medical History:  Diagnosis Date  . Abnormal imaging of thyroid 04/06/2015  . Anemia   . Anxiety   . Benign hypertension   . CAD (coronary artery disease)   . Chronic diarrhea   . Constipation   . Depressive disorder   . DJD (degenerative joint disease)   . Duodenitis   . Dysphagia 04/06/2015  . Follicular lymphoma ([place]) 10/07/2013  . Gastritis   . GERD (gastroesophageal reflux disease)   . Hyperlipidemia   . IBS (irritable bowel syndrome)   . Insomnia 02/28/2014  . Internal hemorrhoids   . Lymphadenopathy 10/22/2012  . Monoclonal gammopathy   . Sore throat 04/06/2015  . Urge incontinence   . Vitamin B12 deficiency    Past Surgical History:  Procedure Laterality Date  . ABDOMINAL HYSTERECTOMY  1992   with ovarian cystectomy  . CORONARY STENT PLACEMENT    . HEMICOLECTOMY    . ileocolonic anasomosis    . SPINE SURGERY        reports that she has been smoking Cigarettes.  She has a 25.00 pack-year smoking history. She has never used smokeless tobacco. She reports that she does not drink alcohol or use drugs. family history includes Kidney disease in her mother; Stroke in her mother. No Known Allergies Current Outpatient Prescriptions on File Prior to Visit  Medication Sig Dispense Refill  . aspirin 81 MG tablet Take 81 mg by mouth daily.      . clopidogrel (PLAVIX) 75 MG tablet Take 75 mg by mouth daily.    . famotidine (PEPCID) 20 MG tablet Take 1 tablet (20 mg total) by mouth 2 (two) times daily. 180 tablet 3  . HYDROcodone-acetaminophen (NORCO) 10-325 MG per tablet Take 1-2 tablets by mouth every 4 (four) hours as needed for pain.     [name] losartan (COZAAR) 100 MG tablet Take 100 mg by mouth 2 (two) times daily.     . nitroGLYCERIN (NITROLINGUAL) 0.4 MG/SPRAY spray Place 1 spray under the tongue every 5 (five) minutes x 3 doses as needed for chest pain.    [name] oxybutynin (DITROPAN) 5 MG tablet Take 5 mg by mouth daily.  3  . potassium chloride (K-DUR) 10 MEQ tablet Take 10 mEq by mouth 2 (two) times daily.   2  . promethazine (PHENERGAN) 25 MG tablet Take 1 tablet (25 mg total) by  mouth every 6 (six) hours as needed (migraine). 15 tablet 0   No current facility-administered medications on file prior to visit.    Review of Systems  Constitutional: Negative for unusual diaphoresis or night sweats HENT: Negative for ear swelling or discharge Eyes: Negative for worsening visual haziness  Respiratory: Negative for choking and stridor.   Gastrointestinal: Negative for distension or worsening eructation Genitourinary: Negative for retention or change in urine volume.  Musculoskeletal: Negative for other MSK pain or swelling Skin: Negative for color change and worsening wound Neurological: Negative for tremors and numbness other than noted  Psychiatric/Behavioral: Negative for decreased concentration or agitation  other than above       Objective:   Physical Exam BP 128/82   Pulse 96   Temp 97.7 F (36.5 C) (Oral)   Resp 20   Wt 147 lb (66.7 kg)   SpO2 94%   BMI 25.23 kg/m  VS noted,  Constitutional: Pt appears in no apparent distress HENT: Head: NCAT.  Right Ear: External ear normal.  Left Ear: External ear normal.  Eyes: . Pupils are equal, round, and reactive to light. Conjunctivae and EOM are normal Neck: Normal range of motion. Neck supple.  Cardiovascular: Normal rate and regular rhythm.   Pulmonary/Chest: Effort normal and breath sounds without rales or wheezing.  Abd:  Soft, NT, ND, + BS Neurological: Pt is alert. Not confused , motor 5/5 intact, cn 2-12 intact, sens intact to LT, gait intact Skin: Skin is warm. No rash, no LE edema Psychiatric: Pt behavior is normal. No agitation.   CT Head no CM - 09-18-15 IMPRESSION: 1. No acute intracranial abnormality is identified. 2. Moderate paranasal sinus disease. 3. Mild chronic microvascular ischemic changes and parenchymal volume loss. Electronically Signed   By: Kristine Garbe M.D.   On: 09/18/2015 02:38    Assessment & Plan:

## 2015-10-01 NOTE — Progress Notes (Signed)
Pre visit review using our clinic review tool, if applicable. No additional management support is needed unless otherwise documented below in the visit note. 

## 2015-10-01 NOTE — Assessment & Plan Note (Signed)
With recent grief now with mild depression, verified no SI or HI, for citalopram 10 qd,  to f/u any worsening symptoms or concerns

## 2015-11-02 ENCOUNTER — Telehealth: Payer: Self-pay | Admitting: *Deleted

## 2015-11-02 NOTE — Telephone Encounter (Signed)
Rec'd fax pt requesting refills on her Butalbital/Acetamenophen/Caff tabs. MD out of office pls advise on refill...Johny Chess

## 2015-11-03 NOTE — Telephone Encounter (Signed)
This was an acute prescription and should not be refilled. Will deny.

## 2015-11-03 NOTE — Telephone Encounter (Signed)
Sent request back w/MD response. RX Denied...Johny Chess

## 2015-11-23 DIAGNOSIS — I10 Essential (primary) hypertension: Secondary | ICD-10-CM | POA: Diagnosis not present

## 2015-11-23 DIAGNOSIS — I2510 Atherosclerotic heart disease of native coronary artery without angina pectoris: Secondary | ICD-10-CM | POA: Diagnosis not present

## 2015-11-23 DIAGNOSIS — J449 Chronic obstructive pulmonary disease, unspecified: Secondary | ICD-10-CM | POA: Diagnosis not present

## 2015-11-23 DIAGNOSIS — G43119 Migraine with aura, intractable, without status migrainosus: Secondary | ICD-10-CM | POA: Diagnosis not present

## 2015-12-03 ENCOUNTER — Ambulatory Visit: Payer: Medicare Other

## 2015-12-04 DIAGNOSIS — G894 Chronic pain syndrome: Secondary | ICD-10-CM | POA: Diagnosis not present

## 2015-12-04 DIAGNOSIS — M5136 Other intervertebral disc degeneration, lumbar region: Secondary | ICD-10-CM | POA: Diagnosis not present

## 2015-12-04 DIAGNOSIS — Z79891 Long term (current) use of opiate analgesic: Secondary | ICD-10-CM | POA: Diagnosis not present

## 2015-12-04 DIAGNOSIS — M48062 Spinal stenosis, lumbar region with neurogenic claudication: Secondary | ICD-10-CM | POA: Diagnosis not present

## 2015-12-07 ENCOUNTER — Encounter: Payer: Self-pay | Admitting: Hematology and Oncology

## 2015-12-07 ENCOUNTER — Ambulatory Visit (HOSPITAL_BASED_OUTPATIENT_CLINIC_OR_DEPARTMENT_OTHER): Payer: Medicare Other | Admitting: Hematology and Oncology

## 2015-12-07 ENCOUNTER — Other Ambulatory Visit (HOSPITAL_BASED_OUTPATIENT_CLINIC_OR_DEPARTMENT_OTHER): Payer: Medicare Other

## 2015-12-07 ENCOUNTER — Telehealth: Payer: Self-pay | Admitting: Hematology and Oncology

## 2015-12-07 VITALS — BP 170/86 | HR 106 | Temp 98.0°F | Resp 19 | Wt 148.6 lb

## 2015-12-07 DIAGNOSIS — F17200 Nicotine dependence, unspecified, uncomplicated: Secondary | ICD-10-CM

## 2015-12-07 DIAGNOSIS — Z8572 Personal history of non-Hodgkin lymphomas: Secondary | ICD-10-CM

## 2015-12-07 DIAGNOSIS — Z23 Encounter for immunization: Secondary | ICD-10-CM | POA: Diagnosis not present

## 2015-12-07 DIAGNOSIS — N644 Mastodynia: Secondary | ICD-10-CM

## 2015-12-07 DIAGNOSIS — I10 Essential (primary) hypertension: Secondary | ICD-10-CM | POA: Diagnosis not present

## 2015-12-07 DIAGNOSIS — Z1231 Encounter for screening mammogram for malignant neoplasm of breast: Secondary | ICD-10-CM

## 2015-12-07 LAB — CBC WITH DIFFERENTIAL/PLATELET
BASO%: 0.5 % (ref 0.0–2.0)
BASOS ABS: 0 10*3/uL (ref 0.0–0.1)
EOS%: 2.3 % (ref 0.0–7.0)
Eosinophils Absolute: 0.1 10*3/uL (ref 0.0–0.5)
HEMATOCRIT: 39.9 % (ref 34.8–46.6)
HGB: 13.2 g/dL (ref 11.6–15.9)
LYMPH#: 1.9 10*3/uL (ref 0.9–3.3)
LYMPH%: 36.9 % (ref 14.0–49.7)
MCH: 32.2 pg (ref 25.1–34.0)
MCHC: 33.1 g/dL (ref 31.5–36.0)
MCV: 97.5 fL (ref 79.5–101.0)
MONO#: 0.5 10*3/uL (ref 0.1–0.9)
MONO%: 9.3 % (ref 0.0–14.0)
NEUT#: 2.7 10*3/uL (ref 1.5–6.5)
NEUT%: 51 % (ref 38.4–76.8)
PLATELETS: 197 10*3/uL (ref 145–400)
RBC: 4.09 10*6/uL (ref 3.70–5.45)
RDW: 13.6 % (ref 11.2–14.5)
WBC: 5.3 10*3/uL (ref 3.9–10.3)

## 2015-12-07 LAB — COMPREHENSIVE METABOLIC PANEL
ALT: 17 U/L (ref 0–55)
ANION GAP: 9 meq/L (ref 3–11)
AST: 17 U/L (ref 5–34)
Albumin: 3.7 g/dL (ref 3.5–5.0)
Alkaline Phosphatase: 97 U/L (ref 40–150)
BUN: 14.2 mg/dL (ref 7.0–26.0)
CALCIUM: 9.4 mg/dL (ref 8.4–10.4)
CHLORIDE: 109 meq/L (ref 98–109)
CO2: 24 meq/L (ref 22–29)
Creatinine: 0.8 mg/dL (ref 0.6–1.1)
EGFR: 81 mL/min/{1.73_m2} — ABNORMAL LOW (ref >90)
Glucose: 85 mg/dl (ref 70–140)
POTASSIUM: 3.4 meq/L — AB (ref 3.5–5.1)
Sodium: 142 mEq/L (ref 136–145)
Total Bilirubin: 0.94 mg/dL (ref 0.20–1.20)
Total Protein: 7.6 g/dL (ref 6.4–8.3)

## 2015-12-07 MED ORDER — INFLUENZA VAC SPLIT QUAD 0.5 ML IM SUSY
0.5000 mL | PREFILLED_SYRINGE | Freq: Once | INTRAMUSCULAR | Status: AC
  Administered 2015-12-07: 0.5 mL via INTRAMUSCULAR
  Filled 2015-12-07: qty 0.5

## 2015-12-07 NOTE — Telephone Encounter (Signed)
Appointments scheduled per 12/07/15 los. Copy of AVS report and appointment schedule was given to patient, per 12/07/15 los.

## 2015-12-09 DIAGNOSIS — R079 Chest pain, unspecified: Secondary | ICD-10-CM | POA: Insufficient documentation

## 2015-12-09 DIAGNOSIS — I10 Essential (primary) hypertension: Secondary | ICD-10-CM | POA: Insufficient documentation

## 2015-12-09 NOTE — Assessment & Plan Note (Signed)
she will continue current medical management. I recommend close follow-up with primary care doctor for medication adjustment.  

## 2015-12-09 NOTE — Assessment & Plan Note (Signed)
I tried to reassure her. I will order screening mammogram but the patient wants to wait until after the new year.

## 2015-12-09 NOTE — Assessment & Plan Note (Signed)
I reassured the patient had a PET/CT scan was negative. I will see her back next year for repeat history, physical examination and blood work for her lymphoma follow-up.

## 2015-12-09 NOTE — Progress Notes (Signed)
[facility] OFFICE PROGRESS NOTE  Patient Care Team: Hoyt Koch, MD as PCP - General (Internal Medicine)  SUMMARY OF ONCOLOGIC HISTORY: Oncology History   Follicular lymphoma, FLIPI score of 3 due to age >62, extranodal disease >4 and stage IV at presentation   Primary site: Lymphoid Neoplasms   Staging method: AJCC 6th Edition   Clinical: Stage IV signed by Heath Lark, MD on 10/07/2013  3:59 PM   Summary: Stage IV       History of B-cell lymphoma   09/03/2013 Imaging    PET/CT scan showed diffuse lymphadenopathy and splenomegaly.      09/27/2013 Bone Marrow Biopsy    Bone marrow biopsy show lymphoma involvement most consistent with follicular lymphoma.      10/18/2013 - 11/08/2013 Chemotherapy    She is given rituximab with prednisone      12/23/2013 Imaging    Repeat PET/CT scan showed near-complete response.      03/24/2014 Imaging    Repeat PET CT scan showed no evidence of cancer      03/26/2015 Imaging    PET scan is negative       INTERVAL HISTORY: Please see below for problem oriented charting. She is in today with her daughter. She feels well. Denies recent infection. No recent lymphadenopathy. She continues to smoke and is attempting to quit smoking herself She thought she felt some discomfort on the right breast  REVIEW OF SYSTEMS:   Constitutional: Denies fevers, chills or abnormal weight loss Eyes: Denies blurriness of vision Ears, nose, mouth, throat, and face: Denies mucositis or sore throat Respiratory: Denies cough, dyspnea or wheezes Cardiovascular: Denies palpitation, chest discomfort or lower extremity swelling Gastrointestinal:  Denies nausea, heartburn or change in bowel habits Skin: Denies abnormal skin rashes Lymphatics: Denies new lymphadenopathy or easy bruising Neurological:Denies numbness, tingling or new weaknesses Behavioral/Psych: Mood is stable, no new changes  All other systems were reviewed with the  patient and are negative.  I have reviewed the past medical history, past surgical history, social history and family history with the patient and they are unchanged from previous note.  ALLERGIES:  has No Known Allergies.  MEDICATIONS:  Current Outpatient Prescriptions  Medication Sig Dispense Refill  . aspirin 81 MG tablet Take 81 mg by mouth daily.      . citalopram (CELEXA) 10 MG tablet Take 1 tablet (10 mg total) by mouth daily. 90 tablet 3  . clopidogrel (PLAVIX) 75 MG tablet Take 75 mg by mouth daily.    . famotidine (PEPCID) 20 MG tablet Take 1 tablet (20 mg total) by mouth 2 (two) times daily. 180 tablet 3  . HYDROcodone-acetaminophen (NORCO) 10-325 MG per tablet Take 1-2 tablets by mouth every 4 (four) hours as needed for pain.     [name] LORazepam (ATIVAN) 1 MG tablet Take 1 tablet (1 mg total) by mouth 2 (two) times daily as needed. for anxiety 60 tablet 2  . losartan (COZAAR) 100 MG tablet Take 100 mg by mouth 2 (two) times daily.     . megestrol (MEGACE) 40 MG/ML suspension Take by mouth every other day.    . nitroGLYCERIN (NITROLINGUAL) 0.4 MG/SPRAY spray Place 1 spray under the tongue every 5 (five) minutes x 3 doses as needed for chest pain.    [name] oxybutynin (DITROPAN) 5 MG tablet Take 5 mg by mouth daily.  3  . potassium chloride (K-DUR) 10 MEQ tablet Take 10 mEq by mouth 2 (two) times daily.  2  . promethazine (PHENERGAN) 25 MG tablet Take 1 tablet (25 mg total) by mouth every 6 (six) hours as needed (migraine). 15 tablet 0   No current facility-administered medications for this visit.     PHYSICAL EXAMINATION: ECOG PERFORMANCE STATUS: 0 - Asymptomatic  Vitals:   12/07/15 1519  BP: (!) 170/86  Pulse: (!) 106  Resp: 19  Temp: 98 F (36.7 C)   Filed Weights   12/07/15 1519  Weight: 148 lb 9.6 oz (67.4 kg)    GENERAL:alert, no distress and comfortable SKIN: skin color, texture, turgor are normal, no rashes or significant lesions EYES: normal, Conjunctiva are pink  and non-injected, sclera clear OROPHARYNX:no exudate, no erythema and lips, buccal mucosa, and tongue normal  NECK: supple, thyroid normal size, non-tender, without nodularity LYMPH:  no palpable lymphadenopathy in the cervical, axillary or inguinal LUNGS: clear to auscultation and percussion with normal breathing effort HEART: regular rate & rhythm and no murmurs and no lower extremity edema ABDOMEN:abdomen soft, non-tender and normal bowel sounds Musculoskeletal:no cyanosis of digits and no clubbing  NEURO: alert & oriented x 3 with fluent speech, no focal motor/sensory deficits Bilateral breasts examination did not reveal any abnormalities. LABORATORY DATA:  I have reviewed the data as listed    Component Value Date/Time   NA 142 12/07/2015 1501   K 3.4 (L) 12/07/2015 1501   CL 111 09/24/2015 0645   CO2 24 12/07/2015 1501   GLUCOSE 85 12/07/2015 1501   BUN 14.2 12/07/2015 1501   CREATININE 0.8 12/07/2015 1501   CALCIUM 9.4 12/07/2015 1501   PROT 7.6 12/07/2015 1501   ALBUMIN 3.7 12/07/2015 1501   AST 17 12/07/2015 1501   ALT 17 12/07/2015 1501   ALKPHOS 97 12/07/2015 1501   BILITOT 0.94 12/07/2015 1501   GFRNONAA >60 09/24/2015 0645   GFRAA >60 09/24/2015 0645    No results found for: SPEP, UPEP  Lab Results  Component Value Date   WBC 5.3 12/07/2015   NEUTROABS 2.7 12/07/2015   HGB 13.2 12/07/2015   HCT 39.9 12/07/2015   MCV 97.5 12/07/2015   PLT 197 12/07/2015      Chemistry      Component Value Date/Time   NA 142 12/07/2015 1501   K 3.4 (L) 12/07/2015 1501   CL 111 09/24/2015 0645   CO2 24 12/07/2015 1501   BUN 14.2 12/07/2015 1501   CREATININE 0.8 12/07/2015 1501      Component Value Date/Time   CALCIUM 9.4 12/07/2015 1501   ALKPHOS 97 12/07/2015 1501   AST 17 12/07/2015 1501   ALT 17 12/07/2015 1501   BILITOT 0.94 12/07/2015 1501      ASSESSMENT & PLAN:  History of B-cell lymphoma I reassured the patient had a PET/CT scan was negative. I will  see her back next year for repeat history, physical examination and blood work for her lymphoma follow-up.      TOBACCO DEPENDENCE I spent some time counseling the patient the importance of tobacco cessation. she is currently attempting to quit on her own    Benign hypertension she will continue current medical management. I recommend close follow-up with primary care doctor for medication adjustment.   Soreness breast I tried to reassure her. I will order screening mammogram but the patient wants to wait until after the new year.   Orders Placed This Encounter  Procedures  . MM DIGITAL SCREENING BILATERAL    Standing Status:   Future    Standing Expiration Date:  01/10/2017    Scheduling Instructions:     Patient wants to wait until after new year    Order Specific Question:   Reason for exam:    Answer:   screening mammo    Order Specific Question:   Preferred imaging location?    Answer:   [facility]  . CBC with Differential/Platelet    Standing Status:   Future    Standing Expiration Date:   01/10/2017  . Comprehensive metabolic panel    Standing Status:   Future    Standing Expiration Date:   01/10/2017  . Lactate dehydrogenase    Standing Status:   Future    Standing Expiration Date:   01/10/2017   All questions were answered. The patient knows to call the clinic with any problems, questions or concerns. No barriers to learning was detected. I spent 15 minutes counseling the patient face to face. The total time spent in the appointment was 20 minutes and more than 50% was on counseling and review of test results     Heath Lark, MD 12/09/2015 10:25 AM

## 2015-12-09 NOTE — Assessment & Plan Note (Signed)
I spent some time counseling the patient the importance of tobacco cessation. she is currently attempting to quit on her own 

## 2015-12-16 ENCOUNTER — Other Ambulatory Visit: Payer: Self-pay | Admitting: Internal Medicine

## 2015-12-23 ENCOUNTER — Telehealth: Payer: Self-pay | Admitting: *Deleted

## 2015-12-23 NOTE — Telephone Encounter (Signed)
Called [facility] on [address] to make sure they see order for Screening Mammogram placed by Dr. Alvy Bimler.  They see order, it is placed correctly and they will contact pt to make appt for January.

## 2016-01-20 ENCOUNTER — Telehealth: Payer: Self-pay | Admitting: *Deleted

## 2016-01-20 MED ORDER — LORAZEPAM 1 MG PO TABS: 1.0000 mg | ORAL_TABLET | Freq: Two times a day (BID) | ORAL | 0 refills | Status: DC | PRN

## 2016-01-20 NOTE — Telephone Encounter (Signed)
Rec'd call pt states her pharmacy been trying to get a refill on her Lorazepam for over a week now. She haven't had any and having a hard time. Pt is requesting med to be refill ASAP. MD is not in the office pls advise...Johny Chess

## 2016-01-20 NOTE — Telephone Encounter (Signed)
Done hardcopy to Corinne  

## 2016-01-21 NOTE — Telephone Encounter (Signed)
Notified pt rx has been faxed to [facility]...[name]

## 2016-02-16 ENCOUNTER — Encounter: Payer: Self-pay | Admitting: Gastroenterology

## 2016-02-18 ENCOUNTER — Other Ambulatory Visit: Payer: Self-pay | Admitting: Internal Medicine

## 2016-02-25 ENCOUNTER — Encounter: Payer: Medicare Other | Admitting: Internal Medicine

## 2016-03-18 ENCOUNTER — Other Ambulatory Visit: Payer: Self-pay | Admitting: Internal Medicine

## 2016-04-15 ENCOUNTER — Other Ambulatory Visit: Payer: Self-pay | Admitting: Internal Medicine

## 2016-05-12 ENCOUNTER — Encounter: Payer: Medicare Other | Admitting: Internal Medicine

## 2016-05-12 DIAGNOSIS — H2513 Age-related nuclear cataract, bilateral: Secondary | ICD-10-CM | POA: Diagnosis not present

## 2016-05-13 ENCOUNTER — Other Ambulatory Visit: Payer: Self-pay | Admitting: Internal Medicine

## 2016-05-16 ENCOUNTER — Other Ambulatory Visit (INDEPENDENT_AMBULATORY_CARE_PROVIDER_SITE_OTHER): Payer: Medicare Other

## 2016-05-16 ENCOUNTER — Encounter: Payer: Self-pay | Admitting: Internal Medicine

## 2016-05-16 ENCOUNTER — Ambulatory Visit (INDEPENDENT_AMBULATORY_CARE_PROVIDER_SITE_OTHER): Payer: Medicare Other | Admitting: Internal Medicine

## 2016-05-16 VITALS — BP 140/72 | HR 94 | Temp 98.6°F | Resp 12 | Ht 64.0 in | Wt 144.0 lb

## 2016-05-16 DIAGNOSIS — Z23 Encounter for immunization: Secondary | ICD-10-CM

## 2016-05-16 DIAGNOSIS — Z0000 Encounter for general adult medical examination without abnormal findings: Secondary | ICD-10-CM

## 2016-05-16 DIAGNOSIS — G43809 Other migraine, not intractable, without status migrainosus: Secondary | ICD-10-CM

## 2016-05-16 DIAGNOSIS — F411 Generalized anxiety disorder: Secondary | ICD-10-CM | POA: Diagnosis not present

## 2016-05-16 DIAGNOSIS — I10 Essential (primary) hypertension: Secondary | ICD-10-CM | POA: Diagnosis not present

## 2016-05-16 LAB — COMPREHENSIVE METABOLIC PANEL
ALBUMIN: 3.9 g/dL (ref 3.5–5.2)
ALK PHOS: 76 U/L (ref 39–117)
ALT: 23 U/L (ref 0–35)
AST: 22 U/L (ref 0–37)
BUN: 15 mg/dL (ref 6–23)
CO2: 25 mEq/L (ref 19–32)
CREATININE: 0.81 mg/dL (ref 0.40–1.20)
Calcium: 9 mg/dL (ref 8.4–10.5)
Chloride: 111 mEq/L (ref 96–112)
GFR: 87.86 mL/min (ref >60.00)
GLUCOSE: 87 mg/dL (ref 70–99)
POTASSIUM: 3.7 meq/L (ref 3.5–5.1)
SODIUM: 141 meq/L (ref 135–145)
TOTAL PROTEIN: 7 g/dL (ref 6.0–8.3)
Total Bilirubin: 1 mg/dL (ref 0.2–1.2)

## 2016-05-16 LAB — LIPID PANEL
CHOLESTEROL: 169 mg/dL (ref 0–200)
HDL: 37.8 mg/dL — ABNORMAL LOW (ref >39.00)
LDL Cholesterol: 110 mg/dL — ABNORMAL HIGH (ref 0–99)
NONHDL: 131.01
Total CHOL/HDL Ratio: 4
Triglycerides: 103 mg/dL (ref 0.0–149.0)
VLDL: 20.6 mg/dL (ref 0.0–40.0)

## 2016-05-16 LAB — CBC
HEMATOCRIT: 36.6 % (ref 36.0–46.0)
HEMOGLOBIN: 12.5 g/dL (ref 12.0–15.0)
MCHC: 34.2 g/dL (ref 30.0–36.0)
MCV: 96.2 fl (ref 78.0–100.0)
Platelets: 158 10*3/uL (ref 150.0–400.0)
RBC: 3.8 Mil/uL — ABNORMAL LOW (ref 3.87–5.11)
RDW: 13.8 % (ref 11.5–15.5)
WBC: 5.1 10*3/uL (ref 4.0–10.5)

## 2016-05-16 MED ORDER — BUTALBITAL-APAP-CAFFEINE 50-325-40 MG PO TABS: 1.0000 | ORAL_TABLET | Freq: Four times a day (QID) | ORAL | 0 refills | Status: DC | PRN

## 2016-05-16 MED ORDER — PROMETHAZINE HCL 25 MG PO TABS: 25.0000 mg | ORAL_TABLET | Freq: Four times a day (QID) | ORAL | 0 refills | Status: DC | PRN

## 2016-05-16 NOTE — Patient Instructions (Signed)
We are checking the labs today. We have given you the pneumonia shot today.   We have ordered the mammogram so call the breast center to get it scheduled [phone]  Come back for the physical.
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
## 2016-05-16 NOTE — Progress Notes (Signed)
Pre visit review using our clinic review tool, if applicable. No additional management support is needed unless otherwise documented below in the visit note. 

## 2016-05-17 NOTE — Assessment & Plan Note (Signed)
BP at goal on losartan 100 mg daily, checking CMP and adjust as needed. Some headaches which are likely related to sinuses not BP.

## 2016-05-17 NOTE — Assessment & Plan Note (Signed)
Refill celexa and lorazepam and will continue this discussion at her wellness. She is aware of the increase in memory changes and increase in fall risk with long term usage but feels it significantly improves her QOL and is willing to take this risk.

## 2016-05-17 NOTE — Assessment & Plan Note (Addendum)
Rx for promethazine and fiorcet for short term usage and talked to her about using tylenol only when needed. Talked to her about the fact that smoking can bother her sinuses and she should start taking zyrtec daily to help with pressure from sinuses.

## 2016-05-18 DIAGNOSIS — I10 Essential (primary) hypertension: Secondary | ICD-10-CM | POA: Diagnosis not present

## 2016-05-18 DIAGNOSIS — J449 Chronic obstructive pulmonary disease, unspecified: Secondary | ICD-10-CM | POA: Diagnosis not present

## 2016-05-18 DIAGNOSIS — I2510 Atherosclerotic heart disease of native coronary artery without angina pectoris: Secondary | ICD-10-CM | POA: Diagnosis not present

## 2016-05-24 ENCOUNTER — Other Ambulatory Visit: Payer: Self-pay | Admitting: Internal Medicine

## 2016-05-24 DIAGNOSIS — Z1231 Encounter for screening mammogram for malignant neoplasm of breast: Secondary | ICD-10-CM

## 2016-05-25 ENCOUNTER — Ambulatory Visit
Admission: RE | Admit: 2016-05-25 | Discharge: 2016-05-25 | Disposition: A | Discharge status: Home / Self Care | Payer: Medicare Other | Source: Ambulatory Visit | Attending: Internal Medicine | Admitting: Internal Medicine

## 2016-05-25 DIAGNOSIS — Z1231 Encounter for screening mammogram for malignant neoplasm of breast: Secondary | ICD-10-CM

## 2016-05-26 ENCOUNTER — Encounter: Payer: Self-pay | Admitting: Internal Medicine

## 2016-05-26 ENCOUNTER — Ambulatory Visit (INDEPENDENT_AMBULATORY_CARE_PROVIDER_SITE_OTHER): Payer: Medicare Other | Admitting: Internal Medicine

## 2016-05-26 VITALS — BP 120/70 | HR 112 | Temp 99.0°F | Resp 12 | Ht 64.0 in | Wt 145.0 lb

## 2016-05-26 DIAGNOSIS — Z0000 Encounter for general adult medical examination without abnormal findings: Secondary | ICD-10-CM

## 2016-05-26 DIAGNOSIS — F17200 Nicotine dependence, unspecified, uncomplicated: Secondary | ICD-10-CM

## 2016-05-26 DIAGNOSIS — K589 Irritable bowel syndrome without diarrhea: Secondary | ICD-10-CM

## 2016-05-26 MED ORDER — LORAZEPAM 1 MG PO TABS: 1.0000 mg | ORAL_TABLET | Freq: Two times a day (BID) | ORAL | 4 refills | Status: DC | PRN

## 2016-05-26 NOTE — Progress Notes (Signed)
Pre visit review using our clinic review tool, if applicable. No additional management support is needed unless otherwise documented below in the visit note. 

## 2016-05-26 NOTE — Progress Notes (Signed)
   Subjective:    Patient ID: [name], female    DOB: March 15, 1935, 81 y.o.   MRN: [id number]  HPI Here for medicare wellness and physical, no new complaints. Please see A/P for status and treatment of chronic medical problems.   Diet: heart healthy Physical activity: sedentary Depression/mood screen: controlled Hearing: intact to whispered voice Visual acuity: grossly normal with lens, performs annual eye exam  ADLs: capable Fall risk: none Home safety: good Cognitive evaluation: intact to orientation, naming, recall and repetition EOL planning: adv directives discussed  I have personally reviewed and have noted 1. The patient's medical and social history - reviewed today no changes 2. Their use of alcohol, tobacco or illicit drugs 3. Their current medications and supplements 4. The patient's functional ability including ADL's, fall risks, home safety risks and hearing or visual impairment. 5. Diet and physical activities 6. Evidence for depression or mood disorders 7. Care team reviewed and updated (available in snapshot)  Review of Systems  Constitutional: Negative.   HENT: Negative.   Eyes: Negative.   Respiratory: Negative for cough, chest tightness and shortness of breath.   Cardiovascular: Negative for chest pain, palpitations and leg swelling.  Gastrointestinal: Negative for abdominal distention, abdominal pain, constipation, diarrhea, nausea and vomiting.  Musculoskeletal: Negative.   Skin: Negative.   Neurological: Negative.   Psychiatric/Behavioral: Negative.       Objective:   Physical Exam  Constitutional: She is oriented to person, place, and time. She appears well-developed and well-nourished.  HENT:  Head: Normocephalic and atraumatic.  Eyes: EOM are normal.  Neck: Normal range of motion.  Cardiovascular: Normal rate and regular rhythm.   Pulmonary/Chest: Effort normal and breath sounds normal. No respiratory distress. She has no wheezes. She has no  rales.  Abdominal: Soft. Bowel sounds are normal. She exhibits no distension. There is no tenderness. There is no rebound.  Musculoskeletal: She exhibits no edema.  Neurological: She is alert and oriented to person, place, and time. Coordination normal.  Skin: Skin is warm and dry.  Psychiatric: She has a normal mood and affect.   Vitals:   05/26/16 1504  BP: 120/70  Pulse: (!) 112  Resp: 12  Temp: 99 F (37.2 C)  TempSrc: Oral  SpO2: 99%  Weight: 145 lb (65.8 kg)  Height: 5\' 4"  (1.626 m)      Assessment & Plan:

## 2016-05-26 NOTE — Patient Instructions (Signed)
Your mammogram was perfectly normal.   Health Maintenance, Female Adopting a healthy lifestyle and getting preventive care can go a long way to promote health and wellness. Talk with your health care provider about what schedule of regular examinations is right for you. This is a good chance for you to check in with your provider about disease prevention and staying healthy. In between checkups, there are plenty of things you can do on your own. Experts have done a lot of research about which lifestyle changes and preventive measures are most likely to keep you healthy. Ask your health care provider for more information. Weight and diet Eat a healthy diet  Be sure to include plenty of vegetables, fruits, low-fat dairy products, and lean protein.  Do not eat a lot of foods high in solid fats, added sugars, or salt.  Get regular exercise. This is one of the most important things you can do for your health.  Most adults should exercise for at least 150 minutes each week. The exercise should increase your heart rate and make you sweat (moderate-intensity exercise).  Most adults should also do strengthening exercises at least twice a week. This is in addition to the moderate-intensity exercise. Maintain a healthy weight  Body mass index (BMI) is a measurement that can be used to identify possible weight problems. It estimates body fat based on height and weight. Your health care provider can help determine your BMI and help you achieve or maintain a healthy weight.  For females 70 years of age and older:  A BMI below 18.5 is considered underweight.  A BMI of 18.5 to 24.9 is normal.  A BMI of 25 to 29.9 is considered overweight.  A BMI of 30 and above is considered obese. Watch levels of cholesterol and blood lipids  You should start having your blood tested for lipids and cholesterol at 79 years of age, then have this test every 5 years.  You may need to have your cholesterol levels  checked more often if:  Your lipid or cholesterol levels are high.  You are older than 79 years of age.  You are at high risk for heart disease. Cancer screening Lung Cancer  Lung cancer screening is recommended for adults 93-33 years old who are at high risk for lung cancer because of a history of smoking.  A yearly low-dose CT scan of the lungs is recommended for people who:  Currently smoke.  Have quit within the past 15 years.  Have at least a 30-pack-year history of smoking. A pack year is smoking an average of one pack of cigarettes a day for 1 year.  Yearly screening should continue until it has been 15 years since you quit.  Yearly screening should stop if you develop a health problem that would prevent you from having lung cancer treatment. Breast Cancer  Practice breast self-awareness. This means understanding how your breasts normally appear and feel.  It also means doing regular breast self-exams. Let your health care provider know about any changes, no matter how small.  If you are in your 20s or 30s, you should have a clinical breast exam (CBE) by a health care provider every 1-3 years as part of a regular health exam.  If you are 33 or older, have a CBE every year. Also consider having a breast X-ray (mammogram) every year.  If you have a family history of breast cancer, talk to your health care provider about genetic screening.  If you are  at high risk for breast cancer, talk to your health care provider about having an MRI and a mammogram every year.  Breast cancer gene (BRCA) assessment is recommended for women who have family members with BRCA-related cancers. BRCA-related cancers include:  Breast.  Ovarian.  Tubal.  Peritoneal cancers.  Results of the assessment will determine the need for genetic counseling and BRCA1 and BRCA2 testing. Cervical Cancer  Your health care provider may recommend that you be screened regularly for cancer of the pelvic  organs (ovaries, uterus, and vagina). This screening involves a pelvic examination, including checking for microscopic changes to the surface of your cervix (Pap test). You may be encouraged to have this screening done every 3 years, beginning at age 6.  For women ages 36-65, health care providers may recommend pelvic exams and Pap testing every 3 years, or they may recommend the Pap and pelvic exam, combined with testing for human papilloma virus (HPV), every 5 years. Some types of HPV increase your risk of cervical cancer. Testing for HPV may also be done on women of any age with unclear Pap test results.  Other health care providers may not recommend any screening for nonpregnant women who are considered low risk for pelvic cancer and who do not have symptoms. Ask your health care provider if a screening pelvic exam is right for you.  If you have had past treatment for cervical cancer or a condition that could lead to cancer, you need Pap tests and screening for cancer for at least 20 years after your treatment. If Pap tests have been discontinued, your risk factors (such as having a new sexual partner) need to be reassessed to determine if screening should resume. Some women have medical problems that increase the chance of getting cervical cancer. In these cases, your health care provider may recommend more frequent screening and Pap tests. Colorectal Cancer  This type of cancer can be detected and often prevented.  Routine colorectal cancer screening usually begins at 79 years of age and continues through 79 years of age.  Your health care provider may recommend screening at an earlier age if you have risk factors for colon cancer.  Your health care provider may also recommend using home test kits to check for hidden blood in the stool.  A small camera at the end of a tube can be used to examine your colon directly (sigmoidoscopy or colonoscopy). This is done to check for the earliest forms  of colorectal cancer.  Routine screening usually begins at age 60.  Direct examination of the colon should be repeated every 5-10 years through 79 years of age. However, you may need to be screened more often if early forms of precancerous polyps or small growths are found. Skin Cancer  Check your skin from head to toe regularly.  Tell your health care provider about any new moles or changes in moles, especially if there is a change in a mole's shape or color.  Also tell your health care provider if you have a mole that is larger than the size of a pencil eraser.  Always use sunscreen. Apply sunscreen liberally and repeatedly throughout the day.  Protect yourself by wearing long sleeves, pants, a wide-brimmed hat, and sunglasses whenever you are outside. Heart disease, diabetes, and high blood pressure  High blood pressure causes heart disease and increases the risk of stroke. High blood pressure is more likely to develop in:  People who have blood pressure in the high end of  the normal range (130-139/85-89 mm Hg).  People who are overweight or obese.  People who are African American.  If you are 58-46 years of age, have your blood pressure checked every 3-5 years. If you are 35 years of age or older, have your blood pressure checked every year. You should have your blood pressure measured twice-once when you are at a hospital or clinic, and once when you are not at a hospital or clinic. Record the average of the two measurements. To check your blood pressure when you are not at a hospital or clinic, you can use:  An automated blood pressure machine at a pharmacy.  A home blood pressure monitor.  If you are between 35 years and 66 years old, ask your health care provider if you should take aspirin to prevent strokes.  Have regular diabetes screenings. This involves taking a blood sample to check your fasting blood sugar level.  If you are at a normal weight and have a low risk for  diabetes, have this test once every three years after 79 years of age.  If you are overweight and have a high risk for diabetes, consider being tested at a younger age or more often. Preventing infection Hepatitis B  If you have a higher risk for hepatitis B, you should be screened for this virus. You are considered at high risk for hepatitis B if:  You were born in a country where hepatitis B is common. Ask your health care provider which countries are considered high risk.  Your parents were born in a high-risk country, and you have not been immunized against hepatitis B (hepatitis B vaccine).  You have HIV or AIDS.  You use needles to inject street drugs.  You live with someone who has hepatitis B.  You have had sex with someone who has hepatitis B.  You get hemodialysis treatment.  You take certain medicines for conditions, including cancer, organ transplantation, and autoimmune conditions. Hepatitis C  Blood testing is recommended for:  Everyone born from [birth year] through 1963.  Anyone with known risk factors for hepatitis C. Sexually transmitted infections (STIs)  You should be screened for sexually transmitted infections (STIs) including gonorrhea and chlamydia if:  You are sexually active and are younger than 79 years of age.  You are older than 79 years of age and your health care provider tells you that you are at risk for this type of infection.  Your sexual activity has changed since you were last screened and you are at an increased risk for chlamydia or gonorrhea. Ask your health care provider if you are at risk.  If you do not have HIV, but are at risk, it may be recommended that you take a prescription medicine daily to prevent HIV infection. This is called pre-exposure prophylaxis (PrEP). You are considered at risk if:  You are sexually active and do not regularly use condoms or know the HIV status of your partner(s).  You take drugs by injection.  You are  sexually active with a partner who has HIV. Talk with your health care provider about whether you are at high risk of being infected with HIV. If you choose to begin PrEP, you should first be tested for HIV. You should then be tested every 3 months for as long as you are taking PrEP. Pregnancy  If you are premenopausal and you may become pregnant, ask your health care provider about preconception counseling.  If you may become pregnant, take 400  to 800 micrograms (mcg) of folic acid every day.  If you want to prevent pregnancy, talk to your health care provider about birth control (contraception). Osteoporosis and menopause  Osteoporosis is a disease in which the bones lose minerals and strength with aging. This can result in serious bone fractures. Your risk for osteoporosis can be identified using a bone density scan.  If you are 97 years of age or older, or if you are at risk for osteoporosis and fractures, ask your health care provider if you should be screened.  Ask your health care provider whether you should take a calcium or vitamin D supplement to lower your risk for osteoporosis.  Menopause may have certain physical symptoms and risks.  Hormone replacement therapy may reduce some of these symptoms and risks. Talk to your health care provider about whether hormone replacement therapy is right for you. Follow these instructions at home:  Schedule regular health, dental, and eye exams.  Stay current with your immunizations.  Do not use any tobacco products including cigarettes, chewing tobacco, or electronic cigarettes.  If you are pregnant, do not drink alcohol.  If you are breastfeeding, limit how much and how often you drink alcohol.  Limit alcohol intake to no more than 1 drink per day for nonpregnant women. One drink equals 12 ounces of beer, 5 ounces of wine, or 1 ounces of hard liquor.  Do not use street drugs.  Do not share needles.  Ask your health care  provider for help if you need support or information about quitting drugs.  Tell your health care provider if you often feel depressed.  Tell your health care provider if you have ever been abused or do not feel safe at home. This information is not intended to replace advice given to you by your health care provider. Make sure you discuss any questions you have with your health care provider. Document Released: 07/26/2010 Document Revised: 06/18/2015 Document Reviewed: 10/14/2014 Elsevier Interactive Patient Education  2017 [employer].

## 2016-05-27 ENCOUNTER — Ambulatory Visit: Payer: Medicare Other | Admitting: Podiatry

## 2016-05-29 DIAGNOSIS — Z0000 Encounter for general adult medical examination without abnormal findings: Secondary | ICD-10-CM | POA: Insufficient documentation

## 2016-05-29 NOTE — Assessment & Plan Note (Signed)
Stable at this time with dietary control. Not on meds at this time. Has seen GI in the past.

## 2016-05-29 NOTE — Assessment & Plan Note (Signed)
Reminded about the risks and harms of tobacco abuse and she has no intention of quitting at this time.

## 2016-05-29 NOTE — Assessment & Plan Note (Signed)
Declines DEXA, pneumonia and tetanus and flu up to date. Aged out of colonoscopy after last screening. She gets mammogram still due to history of lymphoma which was normal. Counseled on sun safety as well as home safety and fall prevention. Given 10 year screening recommendations.

## 2016-06-09 ENCOUNTER — Other Ambulatory Visit (HOSPITAL_BASED_OUTPATIENT_CLINIC_OR_DEPARTMENT_OTHER): Payer: Medicare Other

## 2016-06-09 ENCOUNTER — Encounter: Payer: Self-pay | Admitting: Hematology and Oncology

## 2016-06-09 ENCOUNTER — Telehealth: Payer: Self-pay | Admitting: Hematology and Oncology

## 2016-06-09 ENCOUNTER — Ambulatory Visit (HOSPITAL_BASED_OUTPATIENT_CLINIC_OR_DEPARTMENT_OTHER): Payer: Medicare Other | Admitting: Hematology and Oncology

## 2016-06-09 DIAGNOSIS — Z8572 Personal history of non-Hodgkin lymphomas: Secondary | ICD-10-CM | POA: Diagnosis not present

## 2016-06-09 DIAGNOSIS — F17200 Nicotine dependence, unspecified, uncomplicated: Secondary | ICD-10-CM | POA: Diagnosis not present

## 2016-06-09 DIAGNOSIS — R2232 Localized swelling, mass and lump, left upper limb: Secondary | ICD-10-CM | POA: Diagnosis not present

## 2016-06-09 DIAGNOSIS — C8290 Follicular lymphoma, unspecified, unspecified site: Secondary | ICD-10-CM | POA: Diagnosis not present

## 2016-06-09 LAB — COMPREHENSIVE METABOLIC PANEL
ALBUMIN: 3.9 g/dL (ref 3.5–5.0)
ALK PHOS: 93 U/L (ref 40–150)
ALT: 13 U/L (ref 0–55)
ANION GAP: 8 meq/L (ref 3–11)
AST: 17 U/L (ref 5–34)
BILIRUBIN TOTAL: 0.75 mg/dL (ref 0.20–1.20)
BUN: 14.1 mg/dL (ref 7.0–26.0)
CALCIUM: 9.3 mg/dL (ref 8.4–10.4)
CO2: 23 mEq/L (ref 22–29)
Chloride: 110 mEq/L — ABNORMAL HIGH (ref 98–109)
Creatinine: 0.9 mg/dL (ref 0.6–1.1)
EGFR: 67 mL/min/{1.73_m2} — AB (ref >90)
Glucose: 117 mg/dl (ref 70–140)
Potassium: 3.8 mEq/L (ref 3.5–5.1)
Sodium: 142 mEq/L (ref 136–145)
Total Protein: 7.5 g/dL (ref 6.4–8.3)

## 2016-06-09 LAB — CBC WITH DIFFERENTIAL/PLATELET
BASO%: 0.2 % (ref 0.0–2.0)
BASOS ABS: 0 10*3/uL (ref 0.0–0.1)
EOS ABS: 0.2 10*3/uL (ref 0.0–0.5)
EOS%: 2.7 % (ref 0.0–7.0)
HEMATOCRIT: 37.6 % (ref 34.8–46.6)
HGB: 12.6 g/dL (ref 11.6–15.9)
LYMPH#: 2.3 10*3/uL (ref 0.9–3.3)
LYMPH%: 42.1 % (ref 14.0–49.7)
MCH: 32 pg (ref 25.1–34.0)
MCHC: 33.5 g/dL (ref 31.5–36.0)
MCV: 95.4 fL (ref 79.5–101.0)
MONO#: 0.4 10*3/uL (ref 0.1–0.9)
MONO%: 7.8 % (ref 0.0–14.0)
NEUT#: 2.6 10*3/uL (ref 1.5–6.5)
NEUT%: 47.2 % (ref 38.4–76.8)
PLATELETS: 166 10*3/uL (ref 145–400)
RBC: 3.94 10*6/uL (ref 3.70–5.45)
RDW: 13.8 % (ref 11.2–14.5)
WBC: 5.5 10*3/uL (ref 3.9–10.3)

## 2016-06-09 LAB — LACTATE DEHYDROGENASE: LDH: 214 U/L (ref 125–245)

## 2016-06-09 NOTE — Assessment & Plan Note (Signed)
Clinically, she had no signs of cancer recurrence I will see her back next year for repeat history, physical examination and blood work for her lymphoma follow-up.

## 2016-06-09 NOTE — Telephone Encounter (Signed)
Gave patient AVS and calender per 5/17 los. Lab and f/u in one year.

## 2016-06-09 NOTE — Assessment & Plan Note (Signed)
She is concerned about a palpable lump on the left forearm. I review her PET CT scan from March 2017. It clearly showed changes consistent with lipoma. Examination is also consistent with lipoma. She is reassured No further workup or biopsy is needed

## 2016-06-09 NOTE — Assessment & Plan Note (Signed)
I spent some time counseling the patient the importance of tobacco cessation. she is currently attempting to quit on her own 

## 2016-06-09 NOTE — Progress Notes (Signed)
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      09/27/2013 Bone Marrow Biopsy    Bone marrow biopsy show lymphoma involvement most consistent with follicular lymphoma.      10/18/2013 - 11/08/2013 Chemotherapy    She is given rituximab with prednisone      12/23/2013 Imaging    Repeat PET/CT scan showed near-complete response.      03/24/2014 Imaging    Repeat PET CT scan showed no evidence of cancer      03/26/2015 Imaging    PET scan is negative       INTERVAL HISTORY: Please see below for problem oriented charting. She returns today with her daughter for further evaluation She denies recent infection No new lymphadenopathy She is concerned about a mass palpable on the left forearm since last year She continues to smoke, attempting to quit smoking on her own  REVIEW OF SYSTEMS:   Constitutional: Denies fevers, chills or abnormal weight loss Eyes: Denies blurriness of vision Ears, nose, mouth, throat, and face: Denies mucositis or sore throat Respiratory: Denies cough, dyspnea or wheezes Cardiovascular: Denies palpitation, chest discomfort or lower extremity swelling Gastrointestinal:  Denies nausea, heartburn or change in bowel habits Skin: Denies abnormal skin rashes Lymphatics: Denies new lymphadenopathy or easy bruising Neurological:Denies numbness, tingling or new weaknesses Behavioral/Psych: Mood is stable, no new changes  All other systems were  reviewed with the patient and are negative.  I have reviewed the past medical history, past surgical history, social history and family history with the patient and they are unchanged from previous note.  ALLERGIES:  has No Known Allergies.  MEDICATIONS:  Current Outpatient Prescriptions  Medication Sig Dispense Refill  . aspirin 81 MG tablet Take 81 mg by mouth daily.      . butalbital-acetaminophen-caffeine (FIORICET, ESGIC) 50-325-40 MG tablet Take 1-2 tablets by mouth every 6 (six) hours as needed for headache. 20 tablet 0  . clopidogrel (PLAVIX) 75 MG tablet Take 75 mg by mouth daily.    . famotidine (PEPCID) 20 MG tablet Take 1 tablet (20 mg total) by mouth 2 (two) times daily. 180 tablet 3  . LORazepam (ATIVAN) 1 MG tablet Take 1 tablet (1 mg total) by mouth 2 (two) times daily as needed. for anxiety 60 tablet 4  . losartan (COZAAR) 100 MG tablet Take 100 mg by mouth 2 (two) times daily.     . megestrol (MEGACE) 40 MG/ML suspension Take by mouth every other day.    . oxybutynin (DITROPAN) 5 MG tablet Take 5 mg by mouth daily.  3  . potassium chloride (K-DUR) 10 MEQ tablet Take 10 mEq by mouth 2 (two) times daily.   2  . promethazine (PHENERGAN) 25 MG tablet Take 1 tablet (25 mg total) by mouth every 6 (six) hours as needed (migraine). 15 tablet 0  . nitroGLYCERIN (NITROLINGUAL) 0.4 MG/SPRAY spray Place 1 spray under the tongue every 5 (  five) minutes x 3 doses as needed for chest pain.     No current facility-administered medications for this visit.     PHYSICAL EXAMINATION: ECOG PERFORMANCE STATUS: 0 - Asymptomatic  Vitals:   06/09/16 1505  BP: 116/63  Pulse: 100  Resp: 18  Temp: 98.3 F (36.8 C)   Filed Weights   06/09/16 1505  Weight: 143 lb 12.8 oz (65.2 kg)    GENERAL:alert, no distress and comfortable SKIN: skin color, texture, turgor are normal, no rashes or significant lesions EYES: normal, Conjunctiva are pink and non-injected, sclera clear OROPHARYNX:no  exudate, no erythema and lips, buccal mucosa, and tongue normal  NECK: supple, thyroid normal size, non-tender, without nodularity LYMPH:  no palpable lymphadenopathy in the cervical, axillary or inguinal LUNGS: clear to auscultation and percussion with normal breathing effort HEART: regular rate & rhythm and no murmurs and no lower extremity edema ABDOMEN:abdomen soft, non-tender and normal bowel sounds Musculoskeletal:no cyanosis of digits and no clubbing.  The area of abnormality on the left forearm is consistent with changes most likely due to lipoma NEURO: alert & oriented x 3 with fluent speech, no focal motor/sensory deficits  LABORATORY DATA:  I have reviewed the data as listed    Component Value Date/Time   NA 142 06/09/2016 1448   K 3.8 06/09/2016 1448   CL 111 05/16/2016 1608   CO2 23 06/09/2016 1448   GLUCOSE 117 06/09/2016 1448   BUN 14.1 06/09/2016 1448   CREATININE 0.9 06/09/2016 1448   CALCIUM 9.3 06/09/2016 1448   PROT 7.5 06/09/2016 1448   ALBUMIN 3.9 06/09/2016 1448   AST 17 06/09/2016 1448   ALT 13 06/09/2016 1448   ALKPHOS 93 06/09/2016 1448   BILITOT 0.75 06/09/2016 1448   GFRNONAA >60 09/24/2015 0645   GFRAA >60 09/24/2015 0645    No results found for: SPEP, UPEP  Lab Results  Component Value Date   WBC 5.5 06/09/2016   NEUTROABS 2.6 06/09/2016   HGB 12.6 06/09/2016   HCT 37.6 06/09/2016   MCV 95.4 06/09/2016   PLT 166 06/09/2016      Chemistry      Component Value Date/Time   NA 142 06/09/2016 1448   K 3.8 06/09/2016 1448   CL 111 05/16/2016 1608   CO2 23 06/09/2016 1448   BUN 14.1 06/09/2016 1448   CREATININE 0.9 06/09/2016 1448      Component Value Date/Time   CALCIUM 9.3 06/09/2016 1448   ALKPHOS 93 06/09/2016 1448   AST 17 06/09/2016 1448   ALT 13 06/09/2016 1448   BILITOT 0.75 06/09/2016 1448     I reviewed the PET CT scan from 2017 with the patient RADIOGRAPHIC STUDIES:  I have personally reviewed the radiological images as  listed and agreed with the findings in the report. Mm Screening Breast Tomo Bilateral  Result Date: 05/25/2016 CLINICAL DATA:  Screening. EXAM: 2D DIGITAL SCREENING BILATERAL MAMMOGRAM WITH CAD AND ADJUNCT TOMO COMPARISON:  Previous exam(s). ACR Breast Density Category b: There are scattered areas of fibroglandular density. FINDINGS: There are no findings suspicious for malignancy. Images were processed with CAD. IMPRESSION: No mammographic evidence of malignancy. A result letter of this screening mammogram will be mailed directly to the patient. RECOMMENDATION: Screening mammogram in one year. (Code:SM-B-01Y) BI-RADS CATEGORY  1: Negative. Electronically Signed   By: Fidela Salisbury M.D.   On: 05/25/2016 16:49    ASSESSMENT & PLAN:  History of B-cell lymphoma Clinically, she had no signs of cancer recurrence  I will see her back next year for repeat history, physical examination and blood work for her lymphoma follow-up.  TOBACCO DEPENDENCE I spent some time counseling the patient the importance of tobacco cessation. she is currently attempting to quit on her own  Mass of left forearm She is concerned about a palpable lump on the left forearm. I review her PET CT scan from March 2017. It clearly showed changes consistent with lipoma. Examination is also consistent with lipoma. She is reassured No further workup or biopsy is needed   No orders of the defined types were placed in this encounter.  All questions were answered. The patient knows to call the clinic with any problems, questions or concerns. No barriers to learning was detected. I spent 15 minutes counseling the patient face to face. The total time spent in the appointment was 20 minutes and more than 50% was on counseling and review of test results     Heath Lark, MD 06/09/2016 3:40 PM

## 2016-06-15 DIAGNOSIS — I425 Other restrictive cardiomyopathy: Secondary | ICD-10-CM | POA: Diagnosis not present

## 2016-06-15 DIAGNOSIS — I361 Nonrheumatic tricuspid (valve) insufficiency: Secondary | ICD-10-CM | POA: Diagnosis not present

## 2016-06-15 DIAGNOSIS — I340 Nonrheumatic mitral (valve) insufficiency: Secondary | ICD-10-CM | POA: Diagnosis not present

## 2016-06-15 DIAGNOSIS — I2510 Atherosclerotic heart disease of native coronary artery without angina pectoris: Secondary | ICD-10-CM | POA: Diagnosis not present

## 2016-06-30 DIAGNOSIS — M5136 Other intervertebral disc degeneration, lumbar region: Secondary | ICD-10-CM | POA: Diagnosis not present

## 2016-06-30 DIAGNOSIS — M48062 Spinal stenosis, lumbar region with neurogenic claudication: Secondary | ICD-10-CM | POA: Diagnosis not present

## 2016-06-30 DIAGNOSIS — Z79891 Long term (current) use of opiate analgesic: Secondary | ICD-10-CM | POA: Diagnosis not present

## 2016-07-08 DIAGNOSIS — M545 Low back pain: Secondary | ICD-10-CM | POA: Diagnosis not present

## 2016-08-17 DIAGNOSIS — I2510 Atherosclerotic heart disease of native coronary artery without angina pectoris: Secondary | ICD-10-CM | POA: Diagnosis not present

## 2016-08-17 DIAGNOSIS — I10 Essential (primary) hypertension: Secondary | ICD-10-CM | POA: Diagnosis not present

## 2016-08-17 DIAGNOSIS — M545 Low back pain: Secondary | ICD-10-CM | POA: Diagnosis not present

## 2016-08-22 DIAGNOSIS — G8929 Other chronic pain: Secondary | ICD-10-CM | POA: Diagnosis not present

## 2016-08-22 DIAGNOSIS — M19072 Primary osteoarthritis, left ankle and foot: Secondary | ICD-10-CM | POA: Diagnosis not present

## 2016-08-22 DIAGNOSIS — M79672 Pain in left foot: Secondary | ICD-10-CM | POA: Diagnosis not present

## 2016-08-27 ENCOUNTER — Other Ambulatory Visit: Payer: Self-pay | Admitting: Internal Medicine

## 2016-08-29 NOTE — Telephone Encounter (Signed)
[place] controlled substance database checked.  Ok to fill medication. rx printed 

## 2016-08-29 NOTE — Telephone Encounter (Signed)
Routing to dr burns, please advise in the absence of dr crawford, thanks 

## 2016-08-29 NOTE — Telephone Encounter (Signed)
Faxed to [facility] [phone]

## 2016-09-02 DIAGNOSIS — M5416 Radiculopathy, lumbar region: Secondary | ICD-10-CM | POA: Diagnosis not present

## 2016-09-02 DIAGNOSIS — M5117 Intervertebral disc disorders with radiculopathy, lumbosacral region: Secondary | ICD-10-CM | POA: Diagnosis not present

## 2016-09-02 DIAGNOSIS — M5136 Other intervertebral disc degeneration, lumbar region: Secondary | ICD-10-CM | POA: Diagnosis not present

## 2016-10-31 ENCOUNTER — Other Ambulatory Visit: Payer: Self-pay | Admitting: Internal Medicine

## 2016-11-01 ENCOUNTER — Other Ambulatory Visit: Payer: Self-pay | Admitting: Internal Medicine

## 2016-11-01 NOTE — Telephone Encounter (Signed)
Faxed to [facility] on [address]

## 2016-11-14 ENCOUNTER — Other Ambulatory Visit: Payer: Self-pay | Admitting: Internal Medicine

## 2016-11-15 NOTE — Telephone Encounter (Signed)
Faxed to [facility] on [address]

## 2016-11-25 DIAGNOSIS — H6123 Impacted cerumen, bilateral: Secondary | ICD-10-CM | POA: Diagnosis not present

## 2016-12-12 DIAGNOSIS — M48062 Spinal stenosis, lumbar region with neurogenic claudication: Secondary | ICD-10-CM | POA: Diagnosis not present

## 2016-12-12 DIAGNOSIS — M79602 Pain in left arm: Secondary | ICD-10-CM | POA: Diagnosis not present

## 2016-12-12 DIAGNOSIS — G5792 Unspecified mononeuropathy of left lower limb: Secondary | ICD-10-CM | POA: Diagnosis not present

## 2017-01-13 DIAGNOSIS — G8929 Other chronic pain: Secondary | ICD-10-CM | POA: Diagnosis not present

## 2017-01-13 DIAGNOSIS — M5442 Lumbago with sciatica, left side: Secondary | ICD-10-CM | POA: Diagnosis not present

## 2017-01-13 DIAGNOSIS — M5441 Lumbago with sciatica, right side: Secondary | ICD-10-CM | POA: Diagnosis not present

## 2017-01-20 ENCOUNTER — Other Ambulatory Visit: Payer: Self-pay | Admitting: Internal Medicine

## 2017-01-26 DIAGNOSIS — M545 Low back pain: Secondary | ICD-10-CM | POA: Diagnosis not present

## 2017-01-31 ENCOUNTER — Telehealth: Payer: Self-pay

## 2017-01-31 NOTE — Telephone Encounter (Signed)
PA started on CoverMyMeds KEY: [id number]

## 2017-02-01 NOTE — Telephone Encounter (Signed)
PA denied.

## 2017-02-07 DIAGNOSIS — M545 Low back pain: Secondary | ICD-10-CM | POA: Diagnosis not present

## 2017-02-09 DIAGNOSIS — M545 Low back pain: Secondary | ICD-10-CM | POA: Diagnosis not present

## 2017-02-17 ENCOUNTER — Other Ambulatory Visit: Payer: Self-pay | Admitting: Internal Medicine

## 2017-02-17 NOTE — Telephone Encounter (Signed)
Control database checked last refill: 01/25/2017 LOV5/03/2016

## 2017-02-23 DIAGNOSIS — M545 Low back pain: Secondary | ICD-10-CM | POA: Diagnosis not present

## 2017-02-24 ENCOUNTER — Ambulatory Visit (INDEPENDENT_AMBULATORY_CARE_PROVIDER_SITE_OTHER): Payer: Medicare HMO | Admitting: Internal Medicine

## 2017-02-24 ENCOUNTER — Encounter: Payer: Self-pay | Admitting: Internal Medicine

## 2017-02-24 DIAGNOSIS — F17200 Nicotine dependence, unspecified, uncomplicated: Secondary | ICD-10-CM

## 2017-02-24 DIAGNOSIS — M6530 Trigger finger, unspecified finger: Secondary | ICD-10-CM

## 2017-02-24 DIAGNOSIS — R69 Illness, unspecified: Secondary | ICD-10-CM | POA: Diagnosis not present

## 2017-02-24 MED ORDER — LORAZEPAM 1 MG PO TABS: 1.0000 mg | ORAL_TABLET | Freq: Two times a day (BID) | ORAL | 5 refills | Status: DC | PRN

## 2017-02-24 NOTE — Assessment & Plan Note (Signed)
Time spent counseling about tobacco usage: 3 minutes. I have asked about smoking and is smoking same as usual. The patient is advised to quit. The patient is not willing to quit. They would like to try to quit in the next 6 months. We will follow up with them in 6 months. 

## 2017-02-24 NOTE — Progress Notes (Signed)
   Subjective:    Patient ID: [name], female    DOB: [birth year], 82 y.o.   MRN: [id number]  HPI The patient is a 82 YO female coming in for pain in her hand. Started about 2 months ago.She is having some fingers that stick and this causes her pain.She can pull them free. Happens most every morning and is then painful for several hours. Her son had a similar thing and got it injected and is now feeling well. She would like to pursue if that is a possibility. She denies recent injury or overuse. Denies numbness. Some weakness due to pain. Stable since onset.   Review of Systems  Constitutional: Positive for activity change. Negative for appetite change, chills, fatigue, fever and unexpected weight change.  Respiratory: Negative.   Cardiovascular: Negative.   Gastrointestinal: Negative.   Musculoskeletal: Positive for arthralgias and myalgias. Negative for back pain, gait problem and joint swelling.  Skin: Negative.   Neurological: Negative.       Objective:   Physical Exam  Constitutional: She is oriented to person, place, and time. She appears well-developed and well-nourished.  HENT:  Head: Normocephalic and atraumatic.  Eyes: EOM are normal.  Neck: Normal range of motion.  Cardiovascular: Normal rate and regular rhythm.  Pulmonary/Chest: Effort normal and breath sounds normal. No respiratory distress. She has no wheezes. She has no rales.  Abdominal: Soft. Bowel sounds are normal. She exhibits no distension. There is no tenderness. There is no rebound.  Musculoskeletal: She exhibits tenderness. She exhibits no edema.  Neurological: She is alert and oriented to person, place, and time. Coordination normal.  Skin: Skin is warm and dry.  Psychiatric: She has a normal mood and affect.   Vitals:   02/24/17 1528  BP: 110/70  Pulse: (!) 103  Temp: 98.3 F (36.8 C)  TempSrc: Oral  SpO2: 98%  Weight: 146 lb (66.2 kg)  Height: 5\' 4"  (1.626 m)      Assessment & Plan:

## 2017-02-24 NOTE — Assessment & Plan Note (Signed)
Referral to hand surgery for possible injection of her trigger finger. She is advised to use tylenol for pain.

## 2017-02-24 NOTE — Patient Instructions (Signed)
We have refilled the lorazepam.  We will have the hand specialist call you.

## 2017-02-27 ENCOUNTER — Telehealth: Payer: Self-pay

## 2017-02-27 ENCOUNTER — Other Ambulatory Visit: Payer: Self-pay | Admitting: Family

## 2017-02-27 MED ORDER — LORAZEPAM 1 MG PO TABS: 1.0000 mg | ORAL_TABLET | Freq: Two times a day (BID) | ORAL | 0 refills | Status: DC | PRN

## 2017-02-27 NOTE — Telephone Encounter (Signed)
Patient was upset with [facility] and wanted a paper copy of her lorazepam to take to different pharmacy. Mickel Baas did a 30 day supply paper copy and I called [facility] and cancelled the lorazepam order for there.

## 2017-02-27 NOTE — Telephone Encounter (Signed)
Patient showed up to office claiming she called our office and someone told her to show up and a Rx for her Lorazepam would be ready for pick up. States who she talked to asked for her name and SSN. I called pharmacy medication is on back order and pharmacy states that they can do 0.5mg  and she can take 2 pills or 2mg  and she can cut in half. Patient aware that PCP wont be in office till tomorrow afternoon

## 2017-03-23 ENCOUNTER — Other Ambulatory Visit: Payer: Self-pay | Admitting: Internal Medicine

## 2017-03-23 NOTE — Telephone Encounter (Signed)
Control database checked last refill: 04/27/2017 LOV: 05/26/2016

## 2017-03-28 DIAGNOSIS — I2510 Atherosclerotic heart disease of native coronary artery without angina pectoris: Secondary | ICD-10-CM | POA: Diagnosis not present

## 2017-03-28 DIAGNOSIS — I10 Essential (primary) hypertension: Secondary | ICD-10-CM | POA: Diagnosis not present

## 2017-03-28 DIAGNOSIS — M545 Low back pain: Secondary | ICD-10-CM | POA: Diagnosis not present

## 2017-03-28 DIAGNOSIS — R072 Precordial pain: Secondary | ICD-10-CM | POA: Diagnosis not present

## 2017-04-06 DIAGNOSIS — M5136 Other intervertebral disc degeneration, lumbar region: Secondary | ICD-10-CM | POA: Diagnosis not present

## 2017-04-06 DIAGNOSIS — Z79891 Long term (current) use of opiate analgesic: Secondary | ICD-10-CM | POA: Diagnosis not present

## 2017-04-06 DIAGNOSIS — G894 Chronic pain syndrome: Secondary | ICD-10-CM | POA: Insufficient documentation

## 2017-04-25 DIAGNOSIS — M5416 Radiculopathy, lumbar region: Secondary | ICD-10-CM | POA: Diagnosis not present

## 2017-05-10 DIAGNOSIS — M170 Bilateral primary osteoarthritis of knee: Secondary | ICD-10-CM | POA: Diagnosis not present

## 2017-05-18 ENCOUNTER — Encounter (HOSPITAL_COMMUNITY): Payer: Self-pay | Admitting: Emergency Medicine

## 2017-05-18 ENCOUNTER — Emergency Department (HOSPITAL_COMMUNITY): Payer: Medicare HMO

## 2017-05-18 ENCOUNTER — Observation Stay (HOSPITAL_COMMUNITY)
Admission: EM | Admit: 2017-05-18 | Discharge: 2017-05-19 | Disposition: A | Discharge status: Home / Self Care | Payer: Medicare HMO | Attending: Internal Medicine | Admitting: Internal Medicine

## 2017-05-18 DIAGNOSIS — Z8572 Personal history of non-Hodgkin lymphomas: Secondary | ICD-10-CM | POA: Insufficient documentation

## 2017-05-18 DIAGNOSIS — R079 Chest pain, unspecified: Secondary | ICD-10-CM

## 2017-05-18 DIAGNOSIS — R69 Illness, unspecified: Secondary | ICD-10-CM | POA: Diagnosis not present

## 2017-05-18 DIAGNOSIS — R0602 Shortness of breath: Secondary | ICD-10-CM | POA: Insufficient documentation

## 2017-05-18 DIAGNOSIS — F329 Major depressive disorder, single episode, unspecified: Secondary | ICD-10-CM | POA: Diagnosis not present

## 2017-05-18 DIAGNOSIS — E876 Hypokalemia: Secondary | ICD-10-CM | POA: Diagnosis present

## 2017-05-18 DIAGNOSIS — G4700 Insomnia, unspecified: Secondary | ICD-10-CM | POA: Diagnosis not present

## 2017-05-18 DIAGNOSIS — K589 Irritable bowel syndrome without diarrhea: Secondary | ICD-10-CM | POA: Insufficient documentation

## 2017-05-18 DIAGNOSIS — K219 Gastro-esophageal reflux disease without esophagitis: Secondary | ICD-10-CM | POA: Insufficient documentation

## 2017-05-18 DIAGNOSIS — R739 Hyperglycemia, unspecified: Secondary | ICD-10-CM

## 2017-05-18 DIAGNOSIS — E785 Hyperlipidemia, unspecified: Secondary | ICD-10-CM | POA: Diagnosis not present

## 2017-05-18 DIAGNOSIS — I10 Essential (primary) hypertension: Secondary | ICD-10-CM | POA: Diagnosis not present

## 2017-05-18 DIAGNOSIS — R591 Generalized enlarged lymph nodes: Secondary | ICD-10-CM

## 2017-05-18 DIAGNOSIS — F419 Anxiety disorder, unspecified: Secondary | ICD-10-CM | POA: Insufficient documentation

## 2017-05-18 DIAGNOSIS — Z7982 Long term (current) use of aspirin: Secondary | ICD-10-CM | POA: Diagnosis not present

## 2017-05-18 DIAGNOSIS — R0789 Other chest pain: Principal | ICD-10-CM | POA: Insufficient documentation

## 2017-05-18 DIAGNOSIS — I700 Atherosclerosis of aorta: Secondary | ICD-10-CM | POA: Insufficient documentation

## 2017-05-18 DIAGNOSIS — F17210 Nicotine dependence, cigarettes, uncomplicated: Secondary | ICD-10-CM | POA: Diagnosis not present

## 2017-05-18 DIAGNOSIS — Z955 Presence of coronary angioplasty implant and graft: Secondary | ICD-10-CM | POA: Diagnosis not present

## 2017-05-18 DIAGNOSIS — F17200 Nicotine dependence, unspecified, uncomplicated: Secondary | ICD-10-CM

## 2017-05-18 DIAGNOSIS — D472 Monoclonal gammopathy: Secondary | ICD-10-CM | POA: Diagnosis not present

## 2017-05-18 DIAGNOSIS — Z79899 Other long term (current) drug therapy: Secondary | ICD-10-CM | POA: Insufficient documentation

## 2017-05-18 DIAGNOSIS — I2510 Atherosclerotic heart disease of native coronary artery without angina pectoris: Secondary | ICD-10-CM | POA: Insufficient documentation

## 2017-05-18 LAB — I-STAT TROPONIN, ED: Troponin i, poc: 0 ng/mL (ref 0.00–0.08)

## 2017-05-18 LAB — CBC
HCT: 39.2 % (ref 36.0–46.0)
Hemoglobin: 13.2 g/dL (ref 12.0–15.0)
MCH: 32.3 pg (ref 26.0–34.0)
MCHC: 33.7 g/dL (ref 30.0–36.0)
MCV: 95.8 fL (ref 78.0–100.0)
Platelets: 198 10*3/uL (ref 150–400)
RBC: 4.09 MIL/uL (ref 3.87–5.11)
RDW: 14.6 % (ref 11.5–15.5)
WBC: 7.8 10*3/uL (ref 4.0–10.5)

## 2017-05-18 LAB — BASIC METABOLIC PANEL
Anion gap: 12 (ref 5–15)
BUN: 16 mg/dL (ref 6–20)
CO2: 22 mmol/L (ref 22–32)
Calcium: 8.8 mg/dL — ABNORMAL LOW (ref 8.9–10.3)
Chloride: 104 mmol/L (ref 101–111)
Creatinine, Ser: 0.85 mg/dL (ref 0.44–1.00)
GFR calc Af Amer: >60 mL/min (ref >60)
GLUCOSE: 206 mg/dL — AB (ref 65–99)
POTASSIUM: 3.4 mmol/L — AB (ref 3.5–5.1)
Sodium: 138 mmol/L (ref 135–145)

## 2017-05-18 LAB — PROTIME-INR
INR: 0.99
Prothrombin Time: 13 seconds (ref 11.4–15.2)

## 2017-05-18 MED ORDER — HYDRALAZINE HCL 20 MG/ML IJ SOLN: 10.0000 mg | Freq: Four times a day (QID) | INTRAMUSCULAR | Status: DC | PRN

## 2017-05-18 MED ORDER — POTASSIUM CHLORIDE CRYS ER 20 MEQ PO TBCR
20.0000 meq | EXTENDED_RELEASE_TABLET | Freq: Once | ORAL | Status: AC
  Administered 2017-05-19: 20 meq via ORAL
  Filled 2017-05-18: qty 1

## 2017-05-18 MED ORDER — MORPHINE SULFATE (PF) 4 MG/ML IV SOLN: 0.5000 mg | INTRAVENOUS | Status: DC | PRN

## 2017-05-18 MED ORDER — LORAZEPAM 1 MG PO TABS
1.0000 mg | ORAL_TABLET | Freq: Two times a day (BID) | ORAL | Status: DC | PRN
Start: 2017-05-18 — End: 2017-05-19
  Administered 2017-05-19: 1 mg via ORAL
  Filled 2017-05-18: qty 1

## 2017-05-18 MED ORDER — NITROGLYCERIN 0.4 MG SL SUBL: 0.4000 mg | SUBLINGUAL_TABLET | SUBLINGUAL | Status: DC | PRN

## 2017-05-18 MED ORDER — FAMOTIDINE 20 MG PO TABS
20.0000 mg | ORAL_TABLET | Freq: Two times a day (BID) | ORAL | Status: DC
  Administered 2017-05-19 (×2): 20 mg via ORAL
  Filled 2017-05-18 (×2): qty 1

## 2017-05-18 MED ORDER — OXYBUTYNIN CHLORIDE 5 MG PO TABS
5.0000 mg | ORAL_TABLET | Freq: Every day | ORAL | Status: DC
  Administered 2017-05-19: 5 mg via ORAL
  Filled 2017-05-18: qty 1

## 2017-05-18 MED ORDER — ASPIRIN EC 81 MG PO TBEC
81.0000 mg | DELAYED_RELEASE_TABLET | Freq: Every day | ORAL | Status: DC
  Administered 2017-05-19: 81 mg via ORAL
  Filled 2017-05-18: qty 1

## 2017-05-18 MED ORDER — ENOXAPARIN SODIUM 40 MG/0.4ML ~~LOC~~ SOLN: 40.0000 mg | SUBCUTANEOUS | Status: DC

## 2017-05-18 MED ORDER — ATORVASTATIN CALCIUM 80 MG PO TABS
80.0000 mg | ORAL_TABLET | Freq: Every day | ORAL | Status: DC
  Administered 2017-05-19: 80 mg via ORAL
  Filled 2017-05-18: qty 1

## 2017-05-18 MED ORDER — SODIUM CHLORIDE 0.9 % IV SOLN
INTRAVENOUS | Status: DC
  Administered 2017-05-19 (×3): via INTRAVENOUS

## 2017-05-18 MED ORDER — CLOPIDOGREL BISULFATE 75 MG PO TABS
75.0000 mg | ORAL_TABLET | Freq: Every day | ORAL | Status: DC
  Administered 2017-05-19: 75 mg via ORAL
  Filled 2017-05-18: qty 1

## 2017-05-18 MED ORDER — BUTALBITAL-APAP-CAFFEINE 50-325-40 MG PO TABS: 1.0000 | ORAL_TABLET | Freq: Four times a day (QID) | ORAL | Status: DC | PRN

## 2017-05-18 MED ORDER — CARVEDILOL 6.25 MG PO TABS
6.2500 mg | ORAL_TABLET | Freq: Two times a day (BID) | ORAL | Status: DC
  Administered 2017-05-19 (×2): 6.25 mg via ORAL
  Filled 2017-05-18 (×2): qty 1

## 2017-05-18 MED ORDER — ACETAMINOPHEN 650 MG RE SUPP: 650.0000 mg | Freq: Four times a day (QID) | RECTAL | Status: DC | PRN

## 2017-05-18 MED ORDER — PROMETHAZINE HCL 25 MG PO TABS: 12.5000 mg | ORAL_TABLET | Freq: Four times a day (QID) | ORAL | Status: DC | PRN

## 2017-05-18 MED ORDER — ACETAMINOPHEN 325 MG PO TABS
650.0000 mg | ORAL_TABLET | Freq: Four times a day (QID) | ORAL | Status: DC | PRN
  Administered 2017-05-19 (×2): 650 mg via ORAL
  Filled 2017-05-18 (×3): qty 2

## 2017-05-18 NOTE — H&P (Addendum)
TRH H&P   Patient Demographics:    [name], is a 82 y.o. female  MRN: [id number]   DOB - [birth year]  Admit Date - 05/18/2017  Outpatient Primary MD for the patient is Hoyt Koch, MD  Referring MD/NP/PA: Marye Round  Outpatient Specialists:      Patient coming from: home  Chief Complaint  Patient presents with  . Chest Pain      HPI:    [name]  is a 82 y.o. female, w Jerrye Bushy, IBS, J68 defi,  Follicular lymphoma, MGUS, CAD s/p ? Stent , who presents with c/o chest pain "achy"  Substernal with radiation to the left arm starting about 5:45 pm.  Slight diaphoresis, slight nausea.  Pt states tried nitro spray without relief.   Pain lasted about 1 hour .  Pt denies fever, chills, cough, sob, emesis, diarrhea, abd pain, brbpr.  Pt was brought by EMS to ED for evaluation of chest pain.   In ED,  CXR IMPRESSION: Linear scarring versus atelectasis in the left lung base. No consolidation or edema.   EKG nsr at 80, nl axis, nl int, no st-t changes c/w ischemia   Na 138, K 3.4 Glucose 206 Bun 16, Creatinine 0.85 INR 0.99  Trop 0.00  Pt will be admitted for w/up of chest pain    Review of systems:    In addition to the HPI above, No Fever-chills, No Headache, No changes with Vision or hearing, No problems swallowing food or Liquids, No Cough or Shortness of Breath,  No Abdominal pain, No Vommitting, Bowel movements are regular, No Blood in stool or Urine, No dysuria, No new skin rashes or bruises, No new joints pains-aches,  No new weakness, tingling, numbness in any extremity, No recent weight gain or loss,  No polyuria, polydypsia or polyphagia, No significant Mental Stressors.  A full 10 point Review of Systems was done, except as stated above, all other Review of Systems were negative.   With Past History of the following :    Past  Medical History:  Diagnosis Date  . Abnormal imaging of thyroid 04/06/2015  . Anemia   . Anxiety   . Benign hypertension   . CAD (coronary artery disease)   . Chronic diarrhea   . Constipation   . Depressive disorder   . DJD (degenerative joint disease)   . Duodenitis   . Dysphagia 04/06/2015  . Follicular lymphoma ([place]) 10/07/2013  . Gastritis   . GERD (gastroesophageal reflux disease)   . Hyperlipidemia   . IBS (irritable bowel syndrome)   . Insomnia 02/28/2014  . Internal hemorrhoids   . Lymphadenopathy 10/22/2012  . Monoclonal gammopathy   . Sore throat 04/06/2015  . Urge incontinence   . Vitamin B12 deficiency       Past Surgical History:  Procedure Laterality Date  . ABDOMINAL HYSTERECTOMY  1992  with ovarian cystectomy  . CORONARY STENT PLACEMENT    . HEMICOLECTOMY    . ileocolonic anasomosis    . SPINE SURGERY        Social History:     Social History   Tobacco Use  . Smoking status: Current Every Day Smoker    Packs/day: 0.50    Years: 50.00    Pack years: 25.00    Types: Cigarettes  . Smokeless tobacco: Never Used  Substance Use Topics  . Alcohol use: No     Lives - at home  Mobility - walks by self   Family History :     Family History  Problem Relation Age of Onset  . Kidney disease Mother   . Stroke Mother   . Diabetes Unknown        sibling  . Colon cancer Neg Hx        Home Medications:   Prior to Admission medications   Medication Sig Start Date End Date Taking? Authorizing Provider  aspirin 81 MG tablet Take 81 mg by mouth daily.      [provider]  butalbital-acetaminophen-caffeine (FIORICET, ESGIC) 50-325-40 MG tablet TAKE 1 TO 2 TABLETS BY MOUTH EVERY 6 HOURS AS NEEDED FOR HEADACHE 11/15/16   Hoyt Koch, MD  clopidogrel (PLAVIX) 75 MG tablet Take 75 mg by mouth daily.    [provider]  famotidine (PEPCID) 20 MG tablet Take 1 tablet (20 mg total) by mouth 2 (two) times daily. 09/05/14   Lafayette Dragon, MD  LORazepam (ATIVAN) 1 MG tablet Take 1 tablet (1 mg total) by mouth 2 (two) times daily as needed. for anxiety 02/27/17   Marrian Salvage, FNP  losartan (COZAAR) 100 MG tablet Take 100 mg by mouth 2 (two) times daily.  07/31/12   [provider]  megestrol (MEGACE) 40 MG/ML suspension Take by mouth every other day.    [provider]  nitroGLYCERIN (NITROLINGUAL) 0.4 MG/SPRAY spray Place 1 spray under the tongue every 5 (five) minutes x 3 doses as needed for chest pain.    [provider]  oxybutynin (DITROPAN) 5 MG tablet Take 5 mg by mouth daily. 06/14/15   [provider]  potassium chloride (K-DUR) 10 MEQ tablet Take 10 mEq by mouth 2 (two) times daily.  01/31/14   [provider]  promethazine (PHENERGAN) 25 MG tablet TAKE 1 TABLET(25 MG) BY MOUTH EVERY 6 HOURS AS NEEDED FOR MIGRAINE 01/26/17   Hoyt Koch, MD     Allergies:    No Known Allergies   Physical Exam:   Vitals  Blood pressure 119/64, pulse 87, temperature 98.7 F (37.1 C), temperature source Oral, resp. rate 12, SpO2 100 %.   1. General  lying in bed in NAD,   2. Normal affect and insight, Not Suicidal or Homicidal, Awake Alert, Oriented X 3.  3. No F.N deficits, ALL C.Nerves Intact, Strength 5/5 all 4 extremities, Sensation intact all 4 extremities, Plantars down going.  4. Ears and Eyes appear Normal, Conjunctivae clear, PERRLA. Moist Oral Mucosa.  5. Supple Neck, No JVD, No cervical lymphadenopathy appriciated, No Carotid Bruits.  6. Symmetrical Chest wall movement, Good air movement bilaterally, CTAB.  7. RRR, No Gallops, Rubs or Murmurs, No Parasternal Heave.  8. Positive Bowel Sounds, Abdomen Soft, No tenderness, No organomegaly appriciated,No rebound -guarding or rigidity.  9.  No Cyanosis, Normal Skin Turgor, No Skin Rash or Bruise.  10. Good muscle tone,  joints  appear normal , no effusions, Normal ROM.  11. No Palpable Lymph Nodes in  Neck or Axillae      Data Review:    CBC Recent Labs  Lab 05/18/17 1949  WBC 7.8  HGB 13.2  HCT 39.2  PLT 198  MCV 95.8  MCH 32.3  MCHC 33.7  RDW 14.6   ------------------------------------------------------------------------------------------------------------------  Chemistries  Recent Labs  Lab 05/18/17 1949  NA 138  K 3.4*  CL 104  CO2 22  GLUCOSE 206*  BUN 16  CREATININE 0.85  CALCIUM 8.8*   ------------------------------------------------------------------------------------------------------------------ CrCl cannot be calculated (Unknown ideal weight.). ------------------------------------------------------------------------------------------------------------------ No results for input(s): TSH, T4TOTAL, T3FREE, THYROIDAB in the last 72 hours.  Invalid input(s): FREET3  Coagulation profile Recent Labs  Lab 05/18/17 1949  INR 0.99   ------------------------------------------------------------------------------------------------------------------- No results for input(s): DDIMER in the last 72 hours. -------------------------------------------------------------------------------------------------------------------  Cardiac Enzymes No results for input(s): CKMB, TROPONINI, MYOGLOBIN in the last 168 hours.  Invalid input(s): CK ------------------------------------------------------------------------------------------------------------------ No results found for: BNP   ---------------------------------------------------------------------------------------------------------------  Urinalysis No results found for: COLORURINE, APPEARANCEUR, LABSPEC, [place], GLUCOSEU, HGBUR, BILIRUBINUR, KETONESUR, PROTEINUR, UROBILINOGEN, NITRITE, LEUKOCYTESUR  ----------------------------------------------------------------------------------------------------------------   Imaging Results:    Dg Chest 2 View  Result Date: 05/18/2017 CLINICAL DATA:  Chest burning  and heaviness since this evening. Pain radiates to the left arm and fingers. Unrelieved by nitroglycerin. Nonsmoker. EXAM: CHEST - 2 VIEW COMPARISON:  10/30/2012 FINDINGS: Normal heart size and pulmonary vascularity. Linear scarring versus atelectasis in the left lung base. No airspace disease or consolidation. No blunting of costophrenic angles. No pneumothorax. Mediastinal contours appear intact. Calcification of the aorta. Degenerative changes in the spine. IMPRESSION: Linear scarring versus atelectasis in the left lung base. No consolidation or edema. Electronically Signed   By: Lucienne Capers M.D.   On: 05/18/2017 20:57        Assessment & Plan:    Principal Problem:   Chest pain Active Problems:   Hypokalemia   Hyperglycemia    Chest pain Tele Trop I q6h x3 Check cardia echo Check lipid, hga1c NPO after MN Cont Aspirin 81mg  po qday Cont plavix 75mg  po qday Start Lipitor 80mg  po qhs Start Carvedilol 6.25mg  po bid Start Hydralazine 10mg  iv q6h prn sbp >160 Unclear if taking losartan, will not order for now Cardiology consult by email  Defer to Cardiology regarding stress testing vs Cath  Hypokalemia Unclear if taking kcl 79meq po bid, will hold off on ordering Replete with Kdur 20 meq po x1 Check cmp in am  Hyperglycemia Check hga1c If elevated >6.5 please start in FSBS K5L and ISS  Follicular lymphoma Please f/u with Heath Lark as outpatient Unclear if on megace, will not order for now.   DVT Prophylaxis Lovenox - SCDs  AM Labs Ordered, also please review Full Orders  Family Communication: Admission, patients condition and plan of care including tests being ordered have been discussed with the patient who indicate understanding and agree with the plan and Code Status.  Code Status FULL CODE  Likely DC to  home  Condition GUARDED   Consults called: cardiology by email  Admission status: observation  Time spent in minutes : 45   Jani Gravel M.D on  05/18/2017 at 9:47 PM  Between 7am to 7pm - Pager - [phone]   After 7pm go to [email or web address] - password [facility]  [facility] - Office  [phone]

## 2017-05-18 NOTE — ED Notes (Signed)
Pt given turkey sandwich and sprite zero 

## 2017-05-18 NOTE — ED Triage Notes (Signed)
Patient arrived with EMS from home reports central chest " burning/heaviness" onset this evening , pain radiating to left arm and fingers , unrelieved by NTG spray , EMS gave 1 NTG sl and ASA 324 mg prior to arrival .

## 2017-05-18 NOTE — ED Notes (Signed)
Patient transported to X-ray 

## 2017-05-18 NOTE — ED Provider Notes (Signed)
[facility] EMERGENCY DEPARTMENT Provider Note   CSN: [id number] Arrival date & time: 05/18/17  1919     History   Chief Complaint Chief Complaint  Patient presents with  . Chest Pain    HPI [name] is a 82 y.o. female.  HPI Pt started having severe aching chest pain around 6pm.  The pain was in the center left of her chest radiating to the left arm.   The arm also felt numb.  Pt also felt diaphoretic and short of breath.  Patient called EMS.  She was given nitroglycerin and aspirin.  Her symptoms have resolved.  Patient does have history of heart disease.  She states she had a stent in the past.  She does continue to smoke but not as much as she used to. Past Medical History:  Diagnosis Date  . Abnormal imaging of thyroid 04/06/2015  . Anemia   . Anxiety   . Benign hypertension   . CAD (coronary artery disease)   . Chronic diarrhea   . Constipation   . Depressive disorder   . DJD (degenerative joint disease)   . Duodenitis   . Dysphagia 04/06/2015  . Follicular lymphoma ([place]) 10/07/2013  . Gastritis   . GERD (gastroesophageal reflux disease)   . Hyperlipidemia   . IBS (irritable bowel syndrome)   . Insomnia 02/28/2014  . Internal hemorrhoids   . Lymphadenopathy 10/22/2012  . Monoclonal gammopathy   . Sore throat 04/06/2015  . Urge incontinence   . Vitamin B12 deficiency     Patient Active Problem List   Diagnosis Date Noted  . Trigger finger of right hand 02/24/2017  . Mass of left forearm 06/09/2016  . Routine general medical examination at a health care facility 05/29/2016  . Abnormal imaging of thyroid 04/06/2015  . Dysphagia 04/06/2015  . Chronic musculoskeletal pain 03/26/2014  . Insomnia 02/28/2014  . History of B-cell lymphoma 10/07/2013  . B12 DEFICIENCY 10/25/2007  . GERD 03/19/2007  . IBS 03/19/2007  . Migraine 03/19/2007  . MONOCLONAL GAMMOPATHY 01/01/2007  . Anxiety state 03/23/2006  . TOBACCO DEPENDENCE 03/23/2006  .  HEARING LOSS NOS OR DEAFNESS 03/23/2006  . Essential hypertension 03/23/2006  . INCONTINENCE, URGE 03/23/2006    Past Surgical History:  Procedure Laterality Date  . ABDOMINAL HYSTERECTOMY  1992   with ovarian cystectomy  . CORONARY STENT PLACEMENT    . HEMICOLECTOMY    . ileocolonic anasomosis    . SPINE SURGERY       OB History   None      Home Medications    Prior to Admission medications   Medication Sig Start Date End Date Taking? Authorizing Provider  aspirin 81 MG tablet Take 81 mg by mouth daily.      [provider]  butalbital-acetaminophen-caffeine (FIORICET, ESGIC) 50-325-40 MG tablet TAKE 1 TO 2 TABLETS BY MOUTH EVERY 6 HOURS AS NEEDED FOR HEADACHE 11/15/16   Hoyt Koch, MD  clopidogrel (PLAVIX) 75 MG tablet Take 75 mg by mouth daily.    [provider]  famotidine (PEPCID) 20 MG tablet Take 1 tablet (20 mg total) by mouth 2 (two) times daily. 09/05/14   Lafayette Dragon, MD  LORazepam (ATIVAN) 1 MG tablet Take 1 tablet (1 mg total) by mouth 2 (two) times daily as needed. for anxiety 02/27/17   Marrian Salvage, FNP  losartan (COZAAR) 100 MG tablet Take 100 mg by mouth 2 (two) times daily.  07/31/12  [provider]  megestrol (MEGACE) 40 MG/ML suspension Take by mouth every other day.    [provider]  nitroGLYCERIN (NITROLINGUAL) 0.4 MG/SPRAY spray Place 1 spray under the tongue every 5 (five) minutes x 3 doses as needed for chest pain.    [provider]  oxybutynin (DITROPAN) 5 MG tablet Take 5 mg by mouth daily. 06/14/15   [provider]  potassium chloride (K-DUR) 10 MEQ tablet Take 10 mEq by mouth 2 (two) times daily.  01/31/14   [provider]  promethazine (PHENERGAN) 25 MG tablet TAKE 1 TABLET(25 MG) BY MOUTH EVERY 6 HOURS AS NEEDED FOR MIGRAINE 01/26/17   Hoyt Koch, MD    Family History Family History  Problem Relation Age of Onset  . Kidney disease Mother   . Stroke  Mother   . Diabetes Unknown        sibling  . Colon cancer Neg Hx     Social History Social History   Tobacco Use  . Smoking status: Current Every Day Smoker    Packs/day: 0.50    Years: 50.00    Pack years: 25.00    Types: Cigarettes  . Smokeless tobacco: Never Used  Substance Use Topics  . Alcohol use: No  . Drug use: No     Allergies   Patient has no known allergies.   Review of Systems Review of Systems  All other systems reviewed and are negative.    Physical Exam Updated Vital Signs BP 119/64   Pulse 87   Temp 98.7 F (37.1 C) (Oral)   Resp 12   SpO2 100%   Physical Exam  Constitutional: She appears well-developed and well-nourished. No distress.  HENT:  Head: Normocephalic and atraumatic.  Right Ear: External ear normal.  Left Ear: External ear normal.  Eyes: Conjunctivae are normal. Right eye exhibits no discharge. Left eye exhibits no discharge. No scleral icterus.  Neck: Neck supple. No tracheal deviation present.  Cardiovascular: Normal rate, regular rhythm and intact distal pulses.  Pulmonary/Chest: Effort normal and breath sounds normal. No stridor. No respiratory distress. She has no wheezes. She has no rales.  Abdominal: Soft. Bowel sounds are normal. She exhibits no distension. There is no tenderness. There is no rebound and no guarding.  Musculoskeletal: She exhibits no edema or tenderness.  Neurological: She is alert. She has normal strength. No cranial nerve deficit (no facial droop, extraocular movements intact, no slurred speech) or sensory deficit. She exhibits normal muscle tone. She displays no seizure activity. Coordination normal.  Skin: Skin is warm and dry. No rash noted.  Psychiatric: She has a normal mood and affect.  Nursing note and vitals reviewed.    ED Treatments / Results  Labs (all labs ordered are listed, but only abnormal results are displayed) Labs Reviewed  BASIC METABOLIC PANEL - Abnormal; Notable for the  following components:      Result Value   Potassium 3.4 (*)    Glucose, Bld 206 (*)    Calcium 8.8 (*)    All other components within normal limits  CBC  PROTIME-INR  I-STAT TROPONIN, ED    EKG EKG Interpretation  Date/Time:  Thursday May 18 2017 19:27:16 EDT Ventricular Rate:  79 PR Interval:    QRS Duration: 87 QT Interval:  392 QTC Calculation: 450 R Axis:   30 Text Interpretation:  Sinus rhythm No significant change since last tracing Confirmed by Dorie Rank [phone]) on 05/18/2017 7:46:32 PM   Radiology Dg  Chest 2 View  Result Date: 05/18/2017 CLINICAL DATA:  Chest burning and heaviness since this evening. Pain radiates to the left arm and fingers. Unrelieved by nitroglycerin. Nonsmoker. EXAM: CHEST - 2 VIEW COMPARISON:  10/30/2012 FINDINGS: Normal heart size and pulmonary vascularity. Linear scarring versus atelectasis in the left lung base. No airspace disease or consolidation. No blunting of costophrenic angles. No pneumothorax. Mediastinal contours appear intact. Calcification of the aorta. Degenerative changes in the spine. IMPRESSION: Linear scarring versus atelectasis in the left lung base. No consolidation or edema. Electronically Signed   By: Lucienne Capers M.D.   On: 05/18/2017 20:57    Procedures Procedures (including critical care time)  Medications Ordered in ED Medications - No data to display   Initial Impression / Assessment and Plan / ED Course  I have reviewed the triage vital signs and the nursing notes.  Pertinent labs & imaging results that were available during my care of the patient were reviewed by me and considered in my medical decision making (see chart for details).  Clinical Course as of May 19 2134  Thu May 18, 2017  2109 Initial labs normal.  Moderate risk for major adverse cardiac event per heart score.  W   [JK]    Clinical Course User Index [JK] Dorie Rank, MD    Patient presented to the emergency room for evaluation of chest  pain.  Patient has history of coronary artery disease.  Symptoms are concerning for possible angina.  Initial enzymes and EKG however are reassuring.  Considering the patient's risk factors and symptoms I think it is reasonable to bring her in for serial enzymes and further diagnostic testing.  Final Clinical Impressions(s) / ED Diagnoses   Final diagnoses:  Chest pain, unspecified type      Dorie Rank, MD 05/18/17 2137

## 2017-05-19 ENCOUNTER — Observation Stay (HOSPITAL_COMMUNITY): Payer: Medicare HMO

## 2017-05-19 ENCOUNTER — Other Ambulatory Visit: Payer: Self-pay

## 2017-05-19 ENCOUNTER — Encounter (HOSPITAL_COMMUNITY): Payer: Self-pay

## 2017-05-19 DIAGNOSIS — K5903 Drug induced constipation: Secondary | ICD-10-CM | POA: Diagnosis not present

## 2017-05-19 DIAGNOSIS — I10 Essential (primary) hypertension: Secondary | ICD-10-CM | POA: Diagnosis not present

## 2017-05-19 DIAGNOSIS — R079 Chest pain, unspecified: Secondary | ICD-10-CM | POA: Diagnosis not present

## 2017-05-19 DIAGNOSIS — R072 Precordial pain: Secondary | ICD-10-CM | POA: Diagnosis not present

## 2017-05-19 DIAGNOSIS — I25110 Atherosclerotic heart disease of native coronary artery with unstable angina pectoris: Secondary | ICD-10-CM | POA: Diagnosis not present

## 2017-05-19 DIAGNOSIS — I425 Other restrictive cardiomyopathy: Secondary | ICD-10-CM | POA: Diagnosis not present

## 2017-05-19 DIAGNOSIS — K210 Gastro-esophageal reflux disease with esophagitis: Secondary | ICD-10-CM

## 2017-05-19 DIAGNOSIS — T402X5A Adverse effect of other opioids, initial encounter: Secondary | ICD-10-CM | POA: Diagnosis not present

## 2017-05-19 DIAGNOSIS — E876 Hypokalemia: Secondary | ICD-10-CM | POA: Diagnosis not present

## 2017-05-19 LAB — COMPREHENSIVE METABOLIC PANEL
ALK PHOS: 71 U/L (ref 38–126)
ALT: 17 U/L (ref 14–54)
AST: 17 U/L (ref 15–41)
Albumin: 3.1 g/dL — ABNORMAL LOW (ref 3.5–5.0)
Anion gap: 9 (ref 5–15)
BILIRUBIN TOTAL: 0.7 mg/dL (ref 0.3–1.2)
BUN: 12 mg/dL (ref 6–20)
CALCIUM: 8.2 mg/dL — AB (ref 8.9–10.3)
CHLORIDE: 107 mmol/L (ref 101–111)
CO2: 23 mmol/L (ref 22–32)
CREATININE: 0.8 mg/dL (ref 0.44–1.00)
Glucose, Bld: 106 mg/dL — ABNORMAL HIGH (ref 65–99)
Potassium: 3.9 mmol/L (ref 3.5–5.1)
Sodium: 139 mmol/L (ref 135–145)
Total Protein: 5.9 g/dL — ABNORMAL LOW (ref 6.5–8.1)

## 2017-05-19 LAB — LIPID PANEL
CHOL/HDL RATIO: 3 ratio
Cholesterol: 154 mg/dL (ref 0–200)
HDL: 51 mg/dL (ref >40)
LDL Cholesterol: 74 mg/dL (ref 0–99)
TRIGLYCERIDES: 144 mg/dL (ref <150)
VLDL: 29 mg/dL (ref 0–40)

## 2017-05-19 LAB — TROPONIN I
TROPONIN I: 0.04 ng/mL — AB (ref <0.03)
TROPONIN I: 0.06 ng/mL — AB (ref <0.03)
Troponin I: 0.06 ng/mL (ref <0.03)

## 2017-05-19 LAB — MRSA PCR SCREENING: MRSA by PCR: NEGATIVE

## 2017-05-19 LAB — CBC
HCT: 36.5 % (ref 36.0–46.0)
Hemoglobin: 12.1 g/dL (ref 12.0–15.0)
MCH: 31.8 pg (ref 26.0–34.0)
MCHC: 33.2 g/dL (ref 30.0–36.0)
MCV: 96.1 fL (ref 78.0–100.0)
PLATELETS: 173 10*3/uL (ref 150–400)
RBC: 3.8 MIL/uL — ABNORMAL LOW (ref 3.87–5.11)
RDW: 14.5 % (ref 11.5–15.5)
WBC: 8 10*3/uL (ref 4.0–10.5)

## 2017-05-19 LAB — HEMOGLOBIN A1C
HEMOGLOBIN A1C: 5.8 % — AB (ref 4.8–5.6)
MEAN PLASMA GLUCOSE: 119.76 mg/dL

## 2017-05-19 MED ORDER — OMEPRAZOLE 20 MG PO CPDR: 20.0000 mg | DELAYED_RELEASE_CAPSULE | Freq: Every day | ORAL | 1 refills | Status: DC

## 2017-05-19 MED ORDER — LOSARTAN POTASSIUM 100 MG PO TABS: 100.0000 mg | ORAL_TABLET | Freq: Every day | ORAL | Status: DC

## 2017-05-19 MED ORDER — DOCUSATE SODIUM 50 MG PO CAPS: 50.0000 mg | ORAL_CAPSULE | Freq: Two times a day (BID) | ORAL | 1 refills | Status: DC

## 2017-05-19 MED ORDER — HEPARIN (PORCINE) IN NACL 100-0.45 UNIT/ML-% IJ SOLN
1000.0000 [IU]/h | INTRAMUSCULAR | Status: DC
  Administered 2017-05-19: 1000 [IU]/h via INTRAVENOUS
  Filled 2017-05-19: qty 250

## 2017-05-19 MED ORDER — POLYETHYLENE GLYCOL 3350 17 G PO PACK: 17.0000 g | PACK | Freq: Every day | ORAL | 0 refills | Status: DC

## 2017-05-19 MED ORDER — LACTULOSE 10 GM/15ML PO SOLN
10.0000 g | ORAL | Status: AC
  Administered 2017-05-19: 10 g via ORAL
  Filled 2017-05-19: qty 15

## 2017-05-19 MED ORDER — HEPARIN BOLUS VIA INFUSION
4000.0000 [IU] | Freq: Once | INTRAVENOUS | Status: AC
  Administered 2017-05-19: 4000 [IU] via INTRAVENOUS
  Filled 2017-05-19: qty 4000

## 2017-05-19 NOTE — Progress Notes (Signed)
  Echocardiogram 2D Echocardiogram has been performed.  Johny Chess 05/19/2017, 12:43 PM

## 2017-05-19 NOTE — Progress Notes (Signed)
ANTICOAGULATION CONSULT NOTE - Initial Consult  Pharmacy Consult for Heparin Indication: chest pain/ACS  No Known Allergies  Patient Measurements: Height: 5\' 2"  (157.5 cm) Weight: 150 lb 4.8 oz (68.2 kg) IBW/kg (Calculated) : 50.1  Vital Signs: Temp: 98.7 F (37.1 C) (04/26 0700) Temp Source: Oral (04/26 0700) BP: 131/49 (04/26 0700) Pulse Rate: 63 (04/26 0700)  Labs: Recent Labs    05/18/17 1949 05/18/17 2356 05/19/17 0554  HGB 13.2  --  12.1  HCT 39.2  --  36.5  PLT 198  --  173  LABPROT 13.0  --   --   INR 0.99  --   --   CREATININE 0.85  --  0.80  TROPONINI  --  0.06* 0.06*    Estimated Creatinine Clearance: 51.6 mL/min (by C-G formula based on SCr of 0.8 mg/dL).   Medical History: Past Medical History:  Diagnosis Date  . Abnormal imaging of thyroid 04/06/2015  . Anemia   . Anxiety   . Benign hypertension   . CAD (coronary artery disease)   . Chronic diarrhea   . Constipation   . Depressive disorder   . DJD (degenerative joint disease)   . Duodenitis   . Dysphagia 04/06/2015  . Follicular lymphoma ([place]) 10/07/2013  . Gastritis   . GERD (gastroesophageal reflux disease)   . Hyperlipidemia   . IBS (irritable bowel syndrome)   . Insomnia 02/28/2014  . Internal hemorrhoids   . Lymphadenopathy 10/22/2012  . Monoclonal gammopathy   . Sore throat 04/06/2015  . Urge incontinence   . Vitamin B12 deficiency     Assessment: 82 year old female to begin heparin for chest pain / elevated troponins  Goal of Therapy:  Heparin level 0.3-0.7 units/ml Monitor platelets by anticoagulation protocol: Yes   Plan:  Heparin 4000 units iv bolus x 1 Heparin drip at 1000 units/hr Heparin level 8 hours after heparin starts  Thank you Anette Guarneri, PharmD [phone]  Tad Moore 05/19/2017,9:04 AM

## 2017-05-19 NOTE — ED Notes (Signed)
Pt. Ambulate to bathroom with steady gait.

## 2017-05-19 NOTE — Consult Note (Signed)
Referring Physician:  ADDILEE Guerrero is an 82 y.o. female.                       Chief Complaint: Chest pain  HPI: 82 year old female with PMH of hypertension, CAD, DJD, GERD, Hyperlipidemia, Insomnia and anxiety had substernal chest pain radiating to left arm last evening. She is chest pain free. EKG is normal sinus rhythm. Troponin-I is minimally elevated. Lipid panel is normal.  Past Medical History:  Diagnosis Date  . Abnormal imaging of thyroid 04/06/2015  . Anemia   . Anxiety   . Benign hypertension   . CAD (coronary artery disease)   . Chronic diarrhea   . Constipation   . Depressive disorder   . DJD (degenerative joint disease)   . Duodenitis   . Dysphagia 04/06/2015  . Follicular lymphoma ([place]) 10/07/2013  . Gastritis   . GERD (gastroesophageal reflux disease)   . Hyperlipidemia   . IBS (irritable bowel syndrome)   . Insomnia 02/28/2014  . Internal hemorrhoids   . Lymphadenopathy 10/22/2012  . Monoclonal gammopathy   . Sore throat 04/06/2015  . Urge incontinence   . Vitamin B12 deficiency       Past Surgical History:  Procedure Laterality Date  . ABDOMINAL HYSTERECTOMY  1992   with ovarian cystectomy  . CORONARY STENT PLACEMENT    . HEMICOLECTOMY    . ileocolonic anasomosis    . SPINE SURGERY      Family History  Problem Relation Age of Onset  . Kidney disease Mother   . Stroke Mother   . Diabetes Unknown        sibling  . Colon cancer Neg Hx    Social History:  reports that she has been smoking cigarettes.  She has a 25.00 pack-year smoking history. She has never used smokeless tobacco. She reports that she does not drink alcohol or use drugs.  Allergies: No Known Allergies  Medications Prior to Admission  Medication Sig Dispense Refill  . aspirin 81 MG tablet Take 81 mg by mouth daily.      . butalbital-acetaminophen-caffeine (FIORICET, ESGIC) 50-325-40 MG tablet TAKE 1 TO 2 TABLETS BY MOUTH EVERY 6 HOURS AS NEEDED FOR HEADACHE 20 tablet 0  .  clopidogrel (PLAVIX) 75 MG tablet Take 75 mg by mouth daily.    . famotidine (PEPCID) 20 MG tablet Take 1 tablet (20 mg total) by mouth 2 (two) times daily. 180 tablet 3  . LORazepam (ATIVAN) 1 MG tablet Take 1 tablet (1 mg total) by mouth 2 (two) times daily as needed. for anxiety 60 tablet 0  . losartan (COZAAR) 100 MG tablet Take 100 mg by mouth 2 (two) times daily.     . megestrol (MEGACE) 40 MG/ML suspension Take by mouth every other day.    . nitroGLYCERIN (NITROLINGUAL) 0.4 MG/SPRAY spray Place 1 spray under the tongue every 5 (five) minutes x 3 doses as needed for chest pain.    [name] oxybutynin (DITROPAN) 5 MG tablet Take 5 mg by mouth daily.  3  . potassium chloride (K-DUR) 10 MEQ tablet Take 10 mEq by mouth 2 (two) times daily.   2  . promethazine (PHENERGAN) 25 MG tablet TAKE 1 TABLET(25 MG) BY MOUTH EVERY 6 HOURS AS NEEDED FOR MIGRAINE 15 tablet 0    Results for orders placed or performed during the hospital encounter of 05/18/17 (from the past 48 hour(s))  Basic metabolic panel     Status:  Abnormal   Collection Time: 05/18/17  7:49 PM  Result Value Ref Range   Sodium 138 135 - 145 mmol/L   Potassium 3.4 (L) 3.5 - 5.1 mmol/L   Chloride 104 101 - 111 mmol/L   CO2 22 22 - 32 mmol/L   Glucose, Bld 206 (H) 65 - 99 mg/dL   BUN 16 6 - 20 mg/dL   Creatinine, Ser 0.85 0.44 - 1.00 mg/dL   Calcium 8.8 (L) 8.9 - 10.3 mg/dL   GFR calc non Af Amer >60 >60 mL/min   GFR calc Af Amer >60 >60 mL/min    Comment: (NOTE) The eGFR has been calculated using the CKD EPI equation. This calculation has not been validated in all clinical situations. eGFR's persistently <60 mL/min signify possible Chronic Kidney Disease.    Anion gap 12 5 - 15    Comment: Performed at [facility] [address] [place] [address]  CBC     Status: None   Collection Time: 05/18/17  7:49 PM  Result Value Ref Range   WBC 7.8 4.0 - 10.5 K/uL   RBC 4.09 3.87 - 5.11 MIL/uL   Hemoglobin 13.2 12.0 - 15.0  g/dL   HCT 39.2 36.0 - 46.0 %   MCV 95.8 78.0 - 100.0 fL   MCH 32.3 26.0 - 34.0 pg   MCHC 33.7 30.0 - 36.0 g/dL   RDW 14.6 11.5 - 15.5 %   Platelets 198 150 - 400 K/uL    Comment: Performed at [facility] [address] [place], [place] [address]  Protime-INR (order if Patient is taking Coumadin / Warfarin)     Status: None   Collection Time: 05/18/17  7:49 PM  Result Value Ref Range   Prothrombin Time 13.0 11.4 - 15.2 seconds   INR 0.99     Comment: Performed at [facility] [address] [place], [place] [address]  I-stat troponin, ED     Status: None   Collection Time: 05/18/17  7:58 PM  Result Value Ref Range   Troponin i, poc 0.00 0.00 - 0.08 ng/mL   Comment 3            Comment: Due to the release kinetics of cTnI, a negative result within the first hours of the onset of symptoms does not rule out myocardial infarction with certainty. If myocardial infarction is still suspected, repeat the test at appropriate intervals.   Hemoglobin A1c     Status: Abnormal   Collection Time: 05/18/17 11:55 PM  Result Value Ref Range   Hgb A1c MFr Bld 5.8 (H) 4.8 - 5.6 %    Comment: (NOTE) Pre diabetes:          5.7%-6.4% Diabetes:              >6.4% Glycemic control for   <7.0% adults with diabetes    Mean Plasma Glucose 119.76 mg/dL    Comment: Performed at [facility] [address], [place], [place] [address]  Troponin I     Status: Abnormal   Collection Time: 05/18/17 11:56 PM  Result Value Ref Range   Troponin I 0.06 (HH) <0.03 ng/mL    Comment: CRITICAL RESULT CALLED TO, READ BACK BY AND VERIFIED WITH: M.KIRBY,RN 0200 05/19/17 M.CAMPBELL Performed at [facility], [place] [address] [place], [place] [address]   MRSA PCR Screening     Status: None   Collection Time: 05/19/17  2:24 AM  Result Value Ref Range   MRSA by PCR NEGATIVE NEGATIVE    Comment:        The GeneXpert MRSA Assay (FDA approved for NASAL specimens only), is one component  of a comprehensive MRSA colonization surveillance program. It is not intended to diagnose MRSA infection nor to guide or monitor treatment for MRSA infections. Performed at [facility], [place] [address] [place], [place] [address]   Lipid panel     Status: None   Collection Time: 05/19/17  2:25 AM  Result Value Ref Range   Cholesterol 154 0 - 200 mg/dL   Triglycerides 144 <150 mg/dL   HDL 51 >40 mg/dL   Total CHOL/HDL Ratio 3.0 RATIO   VLDL 29 0 - 40 mg/dL   LDL Cholesterol 74 0 - 99 mg/dL    Comment:        Total Cholesterol/HDL:CHD Risk Coronary Heart Disease Risk Table                     Men   Women  1/2 Average Risk   3.4   3.3  Average Risk       5.0   4.4  2 X Average Risk   9.6   7.1  3 X Average Risk  23.4   11.0        Use the calculated Patient Ratio above and the CHD Risk Table to determine the patient's CHD Risk.        ATP III CLASSIFICATION (LDL):  <100     mg/dL   Optimal  100-129  mg/dL   Near or Above                    Optimal  130-159  mg/dL   Borderline  160-189  mg/dL   High  >190     mg/dL   Very High Performed at [facility] [address], [place], [place] [address]   Comprehensive metabolic panel     Status: Abnormal   Collection Time: 05/19/17  5:54 AM  Result Value Ref Range   Sodium 139 135 - 145 mmol/L   Potassium 3.9 3.5 - 5.1 mmol/L   Chloride 107 101 - 111 mmol/L   CO2 23 22 - 32 mmol/L   Glucose, Bld 106 (H) 65 - 99 mg/dL   BUN 12 6 - 20 mg/dL   Creatinine, Ser 0.80 0.44 - 1.00 mg/dL   Calcium 8.2 (L) 8.9 - 10.3 mg/dL   Total Protein 5.9 (L) 6.5 - 8.1 g/dL   Albumin 3.1 (L) 3.5 - 5.0 g/dL   AST 17 15 - 41 U/L   ALT 17 14 - 54 U/L   Alkaline Phosphatase 71 38 - 126 U/L   Total Bilirubin 0.7 0.3 - 1.2 mg/dL   GFR calc non Af Amer >60 >60 mL/min   GFR calc Af Amer >60 >60 mL/min    Comment: (NOTE) The eGFR has been calculated using the CKD EPI equation. This calculation has not been validated in all clinical  situations. eGFR's persistently <60 mL/min signify possible Chronic Kidney Disease.    Anion gap 9 5 - 15    Comment: Performed at [facility] [address], [place] [address]  CBC     Status: Abnormal   Collection Time: 05/19/17  5:54 AM  Result Value Ref Range   WBC 8.0 4.0 - 10.5 K/uL   RBC 3.80 (L) 3.87 -  5.11 MIL/uL   Hemoglobin 12.1 12.0 - 15.0 g/dL   HCT 36.5 36.0 - 46.0 %   MCV 96.1 78.0 - 100.0 fL   MCH 31.8 26.0 - 34.0 pg   MCHC 33.2 30.0 - 36.0 g/dL   RDW 14.5 11.5 - 15.5 %   Platelets 173 150 - 400 K/uL    Comment: Performed at [facility], [place] [address], [place], [place] [address]  Troponin I     Status: Abnormal   Collection Time: 05/19/17  5:54 AM  Result Value Ref Range   Troponin I 0.06 (HH) <0.03 ng/mL    Comment: CRITICAL VALUE NOTED.  VALUE IS CONSISTENT WITH PREVIOUSLY REPORTED AND CALLED VALUE. Performed at [facility], [place] [address], [place], [place] [address]    Dg Chest 2 View  Result Date: 05/18/2017 CLINICAL DATA:  Chest burning and heaviness since this evening. Pain radiates to the left arm and fingers. Unrelieved by nitroglycerin. Nonsmoker. EXAM: CHEST - 2 VIEW COMPARISON:  10/30/2012 FINDINGS: Normal heart size and pulmonary vascularity. Linear scarring versus atelectasis in the left lung base. No airspace disease or consolidation. No blunting of costophrenic angles. No pneumothorax. Mediastinal contours appear intact. Calcification of the aorta. Degenerative changes in the spine. IMPRESSION: Linear scarring versus atelectasis in the left lung base. No consolidation or edema. Electronically Signed   By: Lucienne Capers M.D.   On: 05/18/2017 20:57    Review Of Systems Constitutional: No fever, chills, weight loss or gain. Eyes: No vision change, wears glasses. No discharge or pain. Ears: No hearing loss, No tinnitus. Respiratory: No asthma, COPD, pneumonias. Positive shortness of breath. No  hemoptysis. Cardiovascular: Positive chest pain, palpitation, no leg edema. Gastrointestinal: Positive nausea, vomiting, diarrhea, constipation. No GI bleed. No hepatitis. Genitourinary: No dysuria, hematuria, kidney stone. No incontinance. Neurological: Positive headache, no stroke, seizures. Positive insomnia. Psychiatry: No psych facility admission for anxiety, depression, suicide. No detox. Skin: No rash. Musculoskeletal: Positive joint pain, no fibromyalgia. Positive neck pain, back pain. Lymphadenopathy: No lymphadenopathy. Hematology: H/O anemia, no easy bruising.   Blood pressure (!) 131/49, pulse 63, temperature 98.7 F (37.1 C), temperature source Oral, resp. rate 14, height _0  (1.575 m), weight 68.2 kg (150 lb 4.8 oz), SpO2 98 %. Body mass index is 27.49 kg/m. General appearance: alert, cooperative, appears stated age and no distress Head: Normocephalic, atraumatic. Eyes: Brown eyes, pink conjunctiva, corneas clear. PERRL, EOM's intact. Neck: No adenopathy, no carotid bruit, no JVD, supple, symmetrical, trachea midline and thyroid not enlarged. Resp: Clear to auscultation bilaterally. Significant kyphosis. Cardio: Regular rate and rhythm, S1, S2 normal, II/VI systolic murmur, no click, rub or gallop GI: Soft, non-tender; bowel sounds normal; no organomegaly. Extremities: No edema, cyanosis or clubbing. Skin: Warm and dry.  Neurologic: Alert and oriented X 3, normal strength. Normal coordination and slow gait.  Assessment/Plan Acute coronary syndrome Hypertension CAD DJD GERD Hyperlipidemia  IV heparin. Echocardiogram for LV function. Medical therapy as much as possible per patient  Birdie Riddle, MD  05/19/2017, 9:14 AM

## 2017-05-19 NOTE — Discharge Summary (Signed)
Discharge Summary  [name] PNT:[id number] DOB: 07/02/1935  PCP: Hoyt Koch, MD  Admit date: 05/18/2017 Discharge date: 05/19/2017  Time spent: 35 minutes  Recommendations for Outpatient Follow-up:  1. New medication: Prilosec 20 mg p.o. daily 2. Medication change: Famotidine has been discontinued. 3. Medication clarification: Patient reports taking Cozaar 100 mg p.o. twice daily.  Maximum dose for this medication is 100 mg daily 4. New medication: Colace 50 mill grams p.o. twice daily 5. New medication: MiraLAX 17 g daily  Discharge Diagnoses:  Active Hospital Problems   Diagnosis Date Noted  . Chest pain 12/09/2015  . Hypokalemia 05/18/2017  . Hyperglycemia 05/18/2017    Resolved Hospital Problems  No resolved problems to display.    Discharge Condition: Improved, being discharged home  Diet recommendation: Heart healthy  Vitals:   05/19/17 0700 05/19/17 1207  BP: (!) 131/49 (!) 190/69  Pulse: 63 (!) 52  Resp: 14 13  Temp: 98.7 F (37.1 C) (!) 97.5 F (36.4 C)  SpO2: 98% 100%    History of present illness:  82 year old female with past medical history of hypertension, GERD, DJD presented to the emergency room on the evening of 4/25 with complaints of chest aching located midsternal and felt like it radiated straight up into her jaw that it started earlier that evening.  Patient states that occurred when she was eating an apple.  She denies any previous episode although with prompting, she admits that sometimes she gets similar sensations in the middle the night whenever she eats.  In the emergency room, initial EKG and enzymes unremarkable.  Brought in for further evaluation.  Hospital Course:  Principal Problem:   Chest pain: Patient took aspirin before she came in and by the time she was in the emergency room, pain fully resolved.  No recurrences.  In the emergency room, troponin minimally weakly positive at 0.06 and down to 0.04.  Seen by  cardiology who recommended echocardiogram.  Echocardiogram done with preliminary report negative, although final report negative.  Discussion with the patient, she found that she has a full tray of food that she keeps at her bedside in the middle night, she gets up and eats his meal.  He tells me that she takes famotidine for acid reflux.  She also tells me that sometimes she feels like the food is stuck at the bottom of her esophagus and it takes several Tums to get it out.  I strongly suspect that her chest discomfort was from severe acid reflux may be even esophageal spasm.  Recommended she discontinue her famotidine and start a PPI.  Prescription given.  Also recommended that she no longer eat in the middle of the night.  She eat and then after she does, do not lay down for 2 hours and only eat again when she wakes up in the morning and is upright.  Her daughter is with her and they both expressed understanding.  I told her that if her sensation of food or something getting stuck at the bottom of her esophagus continues to persist even after a few weeks, then she should follow-up with [facility], her gastroenterologist and be evaluated.  She is okay with this plan. Active Problems:   Hypokalemia: Replaced   Hyperglycemia Essential hypertension: Continue home Cozaar, dose clarified only be 100 daily as maximum dose.  Opioid-induced constipation: Patient takes narcotic pain for chronic lower extremity leg pain.  She says her bowels normally move too often and she often has to  take something.  She says her bowels have not moved in a week.  It may be another cause of her acid reflux.  Have ordered lactulose while she is here in the hospital.  Also ordered for discharge, Colace twice daily plus MiraLAX daily.  Procedures:  Echocardiogram: Final result pending.  Reportedly preserved ejection fraction with mild diastolic dysfunction   Consultations:  cardiology  Discharge Exam: BP (!) 190/69 (BP  Location: Right Arm)   Pulse (!) 52   Temp (!) 97.5 F (36.4 C) (Oral)   Resp 13   Ht 5\' 2"  (1.575 m)   Wt 68.2 kg (150 lb 4.8 oz)   SpO2 100%   BMI 27.49 kg/m   General: Alert and oriented x3, no acute distress Cardiovascular: Regular rate and rhythm, S1-S2 Respiratory: Clear to auscultation bilaterally  Discharge Instructions You were cared for by a hospitalist during your hospital stay. If you have any questions about your discharge medications or the care you received while you were in the hospital after you are discharged, you can call the unit and asked to speak with the hospitalist on call if the hospitalist that took care of you is not available. Once you are discharged, your primary care physician will handle any further medical issues. Please note that NO REFILLS for any discharge medications will be authorized once you are discharged, as it is imperative that you return to your primary care physician (or establish a relationship with a primary care physician if you do not have one) for your aftercare needs so that they can reassess your need for medications and monitor your lab values.    Allergies as of 05/19/2017   No Known Allergies     Medication List    STOP taking these medications   famotidine 20 MG tablet Commonly known as:  PEPCID     TAKE these medications   aspirin 81 MG tablet Take 81 mg by mouth daily.   butalbital-acetaminophen-caffeine 50-325-40 MG tablet Commonly known as:  FIORICET, ESGIC TAKE 1 TO 2 TABLETS BY MOUTH EVERY 6 HOURS AS NEEDED FOR HEADACHE   clopidogrel 75 MG tablet Commonly known as:  PLAVIX Take 75 mg by mouth daily.   docusate sodium 50 MG capsule Commonly known as:  COLACE Take 1 capsule (50 mg total) by mouth 2 (two) times daily.   HYDROcodone-acetaminophen 10-325 MG tablet Commonly known as:  NORCO Take 1 tablet by mouth 2 (two) times daily as needed for pain.   LORazepam 1 MG tablet Commonly known as:  ATIVAN Take 1  tablet (1 mg total) by mouth 2 (two) times daily as needed. for anxiety   losartan 100 MG tablet Commonly known as:  COZAAR Take 1 tablet (100 mg total) by mouth daily. What changed:  when to take this   mirtazapine 15 MG tablet Commonly known as:  REMERON Take 15 mg by mouth at bedtime as needed (sleep).   nitroGLYCERIN 0.4 MG/SPRAY spray Commonly known as:  NITROLINGUAL Place 1 spray under the tongue every 5 (five) minutes x 3 doses as needed for chest pain.   omeprazole 20 MG capsule Commonly known as:  PRILOSEC Take 1 capsule (20 mg total) by mouth daily.   oxybutynin 5 MG tablet Commonly known as:  DITROPAN Take 5 mg by mouth daily.   polyethylene glycol packet Commonly known as:  MIRALAX / GLYCOLAX Take 17 g by mouth daily.   potassium chloride 10 MEQ tablet Commonly known as:  K-DUR Take 10 mEq  by mouth daily.   promethazine 25 MG tablet Commonly known as:  PHENERGAN TAKE 1 TABLET(25 MG) BY MOUTH EVERY 6 HOURS AS NEEDED FOR MIGRAINE        No Known Allergies    The results of significant diagnostics from this hospitalization (including imaging, microbiology, ancillary and laboratory) are listed below for reference.    Significant Diagnostic Studies: Dg Chest 2 View  Result Date: 05/18/2017 CLINICAL DATA:  Chest burning and heaviness since this evening. Pain radiates to the left arm and fingers. Unrelieved by nitroglycerin. Nonsmoker. EXAM: CHEST - 2 VIEW COMPARISON:  10/30/2012 FINDINGS: Normal heart size and pulmonary vascularity. Linear scarring versus atelectasis in the left lung base. No airspace disease or consolidation. No blunting of costophrenic angles. No pneumothorax. Mediastinal contours appear intact. Calcification of the aorta. Degenerative changes in the spine. IMPRESSION: Linear scarring versus atelectasis in the left lung base. No consolidation or edema. Electronically Signed   By: Lucienne Capers M.D.   On: 05/18/2017 20:57     Microbiology: Recent Results (from the past 240 hour(s))  MRSA PCR Screening     Status: None   Collection Time: 05/19/17  2:24 AM  Result Value Ref Range Status   MRSA by PCR NEGATIVE NEGATIVE Final    Comment:        The GeneXpert MRSA Assay (FDA approved for NASAL specimens only), is one component of a comprehensive MRSA colonization surveillance program. It is not intended to diagnose MRSA infection nor to guide or monitor treatment for MRSA infections. Performed at [facility], [place] [address] [place], [place] [address]      Labs: Basic Metabolic Panel: Recent Labs  Lab 05/18/17 1949 05/19/17 0554  NA 138 139  K 3.4* 3.9  CL 104 107  CO2 22 23  GLUCOSE 206* 106*  BUN 16 12  CREATININE 0.85 0.80  CALCIUM 8.8* 8.2*   Liver Function Tests: Recent Labs  Lab 05/19/17 0554  AST 17  ALT 17  ALKPHOS 71  BILITOT 0.7  PROT 5.9*  ALBUMIN 3.1*   No results for input(s): LIPASE, AMYLASE in the last 168 hours. No results for input(s): AMMONIA in the last 168 hours. CBC: Recent Labs  Lab 05/18/17 1949 05/19/17 0554  WBC 7.8 8.0  HGB 13.2 12.1  HCT 39.2 36.5  MCV 95.8 96.1  PLT 198 173   Cardiac Enzymes: Recent Labs  Lab 05/18/17 2356 05/19/17 0554 05/19/17 1157  TROPONINI 0.06* 0.06* 0.04*   BNP: BNP (last 3 results) No results for input(s): BNP in the last 8760 hours.  ProBNP (last 3 results) No results for input(s): PROBNP in the last 8760 hours.  CBG: No results for input(s): GLUCAP in the last 168 hours.     Signed:  Annita Brod, MD [facility] 05/19/2017, 4:29 PM

## 2017-05-22 LAB — ECHOCARDIOGRAM COMPLETE
Height: 62 in
Weight: 2404.8 oz

## 2017-06-07 ENCOUNTER — Encounter (HOSPITAL_COMMUNITY): Payer: Self-pay

## 2017-06-07 ENCOUNTER — Emergency Department (HOSPITAL_COMMUNITY)
Admission: EM | Admit: 2017-06-07 | Discharge: 2017-06-07 | Disposition: A | Discharge status: Home / Self Care | Payer: Medicare HMO | Attending: Emergency Medicine | Admitting: Emergency Medicine

## 2017-06-07 ENCOUNTER — Emergency Department (HOSPITAL_COMMUNITY): Payer: Medicare HMO

## 2017-06-07 DIAGNOSIS — Z79899 Other long term (current) drug therapy: Secondary | ICD-10-CM | POA: Insufficient documentation

## 2017-06-07 DIAGNOSIS — M545 Low back pain: Secondary | ICD-10-CM | POA: Diagnosis not present

## 2017-06-07 DIAGNOSIS — R0789 Other chest pain: Secondary | ICD-10-CM | POA: Diagnosis not present

## 2017-06-07 DIAGNOSIS — F17210 Nicotine dependence, cigarettes, uncomplicated: Secondary | ICD-10-CM | POA: Insufficient documentation

## 2017-06-07 DIAGNOSIS — R079 Chest pain, unspecified: Secondary | ICD-10-CM | POA: Diagnosis not present

## 2017-06-07 DIAGNOSIS — I10 Essential (primary) hypertension: Secondary | ICD-10-CM | POA: Diagnosis not present

## 2017-06-07 DIAGNOSIS — S3992XA Unspecified injury of lower back, initial encounter: Secondary | ICD-10-CM | POA: Diagnosis not present

## 2017-06-07 DIAGNOSIS — Z7982 Long term (current) use of aspirin: Secondary | ICD-10-CM | POA: Diagnosis not present

## 2017-06-07 DIAGNOSIS — I2510 Atherosclerotic heart disease of native coronary artery without angina pectoris: Secondary | ICD-10-CM | POA: Diagnosis not present

## 2017-06-07 DIAGNOSIS — R69 Illness, unspecified: Secondary | ICD-10-CM | POA: Diagnosis not present

## 2017-06-07 LAB — CBC WITH DIFFERENTIAL/PLATELET
ABS IMMATURE GRANULOCYTES: 0 10*3/uL (ref 0.0–0.1)
Basophils Absolute: 0 10*3/uL (ref 0.0–0.1)
Basophils Relative: 1 %
EOS PCT: 4 %
Eosinophils Absolute: 0.2 10*3/uL (ref 0.0–0.7)
HEMATOCRIT: 37.9 % (ref 36.0–46.0)
HEMOGLOBIN: 12.6 g/dL (ref 12.0–15.0)
IMMATURE GRANULOCYTES: 0 %
LYMPHS ABS: 1.5 10*3/uL (ref 0.7–4.0)
LYMPHS PCT: 40 %
MCH: 31.6 pg (ref 26.0–34.0)
MCHC: 33.2 g/dL (ref 30.0–36.0)
MCV: 95 fL (ref 78.0–100.0)
Monocytes Absolute: 0.4 10*3/uL (ref 0.1–1.0)
Monocytes Relative: 11 %
NEUTROS ABS: 1.7 10*3/uL (ref 1.7–7.7)
NEUTROS PCT: 44 %
Platelets: 155 10*3/uL (ref 150–400)
RBC: 3.99 MIL/uL (ref 3.87–5.11)
RDW: 13.6 % (ref 11.5–15.5)
WBC: 3.9 10*3/uL — AB (ref 4.0–10.5)

## 2017-06-07 LAB — I-STAT CHEM 8, ED
BUN: 12 mg/dL (ref 6–20)
CHLORIDE: 105 mmol/L (ref 101–111)
Calcium, Ion: 1.11 mmol/L — ABNORMAL LOW (ref 1.15–1.40)
Creatinine, Ser: 0.8 mg/dL (ref 0.44–1.00)
GLUCOSE: 86 mg/dL (ref 65–99)
HCT: 37 % (ref 36.0–46.0)
Hemoglobin: 12.6 g/dL (ref 12.0–15.0)
POTASSIUM: 3.9 mmol/L (ref 3.5–5.1)
Sodium: 142 mmol/L (ref 135–145)
TCO2: 26 mmol/L (ref 22–32)

## 2017-06-07 LAB — I-STAT TROPONIN, ED: Troponin i, poc: 0 ng/mL (ref 0.00–0.08)
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
## 2017-06-09 ENCOUNTER — Inpatient Hospital Stay: Payer: Medicare HMO | Admitting: Hematology and Oncology

## 2017-06-09 ENCOUNTER — Telehealth: Payer: Self-pay

## 2017-06-09 ENCOUNTER — Inpatient Hospital Stay: Payer: Medicare HMO
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
## 2017-06-13 ENCOUNTER — Inpatient Hospital Stay: Payer: Medicare HMO | Admitting: Internal Medicine

## 2017-06-13 ENCOUNTER — Telehealth: Payer: Self-pay | Admitting: Hematology and Oncology

## 2017-06-13 NOTE — Telephone Encounter (Signed)
Mailed patient calendar of upcoming august appointments per 5/17 sch message

## 2017-06-14 ENCOUNTER — Ambulatory Visit (HOSPITAL_COMMUNITY)
Admission: EM | Admit: 2017-06-14 | Discharge: 2017-06-14 | Disposition: A | Discharge status: Home / Self Care | Payer: Medicare HMO | Attending: Family Medicine | Admitting: Family Medicine

## 2017-06-14 ENCOUNTER — Encounter (HOSPITAL_COMMUNITY): Payer: Self-pay | Admitting: Emergency Medicine

## 2017-06-14 DIAGNOSIS — M545 Low back pain, unspecified: Secondary | ICD-10-CM

## 2017-06-14 DIAGNOSIS — M62838 Other muscle spasm: Secondary | ICD-10-CM | POA: Diagnosis not present

## 2017-06-14 MED ORDER — NAPROXEN 375 MG PO TABS: 375.0000 mg | ORAL_TABLET | Freq: Two times a day (BID) | ORAL | 0 refills | Status: DC | PRN

## 2017-06-14 MED ORDER — CYCLOBENZAPRINE HCL 5 MG PO TABS: 5.0000 mg | ORAL_TABLET | Freq: Every day | ORAL | 0 refills | Status: DC

## 2017-06-14 NOTE — Discharge Instructions (Addendum)
Continue conservative management of rest, ice, heat, and gentle stretches °Take naproxen as needed for pain relief (may cause abdominal discomfort, ulcers, and GI bleeds avoid taking with other NSAIDs) °Take cyclobenzaprine at nighttime for symptomatic relief. Avoid driving or operating heavy machinery while using medication. °Follow up with PCP if symptoms persist °Present to ER if worsening or new symptoms (fever, chills, chest pain, abdominal pain, changes in bowel or bladder habits, pain radiating into lower legs, etc...)  °
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
## 2017-06-20 ENCOUNTER — Ambulatory Visit (INDEPENDENT_AMBULATORY_CARE_PROVIDER_SITE_OTHER): Payer: Medicare HMO | Admitting: Internal Medicine

## 2017-06-20 ENCOUNTER — Encounter: Payer: Self-pay | Admitting: Internal Medicine

## 2017-06-20 VITALS — BP 120/72 | HR 109 | Temp 98.7°F | Ht 63.0 in | Wt 147.0 lb

## 2017-06-20 DIAGNOSIS — K5903 Drug induced constipation: Secondary | ICD-10-CM | POA: Diagnosis not present

## 2017-06-20 DIAGNOSIS — M7918 Myalgia, other site: Secondary | ICD-10-CM

## 2017-06-20 DIAGNOSIS — G8929 Other chronic pain: Secondary | ICD-10-CM | POA: Diagnosis not present

## 2017-06-20 DIAGNOSIS — M545 Low back pain, unspecified: Secondary | ICD-10-CM

## 2017-06-20 DIAGNOSIS — K5900 Constipation, unspecified: Secondary | ICD-10-CM | POA: Insufficient documentation

## 2017-06-20 NOTE — Assessment & Plan Note (Signed)
She will continue to take her hydrocodone as prescribed. No indication for further imaging at this time.
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
## 2017-06-20 NOTE — Patient Instructions (Signed)
Work on trying to add back the [employer] for the constipation.

## 2017-06-20 NOTE — Assessment & Plan Note (Signed)
Likely opioid induced. She is advised to take metamucil and/or magnesium daily to help avoid constipation.

## 2017-06-28 ENCOUNTER — Other Ambulatory Visit: Payer: Self-pay | Admitting: Internal Medicine

## 2017-07-05 ENCOUNTER — Telehealth: Payer: Self-pay

## 2017-07-05 NOTE — Telephone Encounter (Signed)
Control database checked last refill: [id number]

## 2017-07-18 NOTE — Telephone Encounter (Signed)
PA approved from 01/22/2017-01/23/2018

## 2017-08-08 ENCOUNTER — Other Ambulatory Visit: Payer: Self-pay | Admitting: Internal Medicine

## 2017-08-08 DIAGNOSIS — Z79891 Long term (current) use of opiate analgesic: Secondary | ICD-10-CM | POA: Diagnosis not present

## 2017-08-08 NOTE — Telephone Encounter (Signed)
Control database checked last refill: 07/12/2017 LOV: 06/20/2017 NOV: 11/29/2017

## 2017-09-05 NOTE — Progress Notes (Deleted)
Subjective:   [name] is a 82 y.o. female who presents for Medicare Annual (Subsequent) preventive examination.  Review of Systems:  No ROS.  Medicare Wellness Visit. Additional risk factors are reflected in the social history.    Sleep patterns: {SX; SLEEP PATTERNS:18802::"feels rested on waking","does not get up to void","gets up *** times nightly to void","sleeps *** hours nightly"}.    Home Safety/Smoke Alarms: Feels safe in home. Smoke alarms in place.  Living environment; residence and Firearm Safety: {Rehab home environment / accessibility:30080::"no firearms","firearms stored safely"}. Seat Belt Safety/Bike Helmet: Wears seat belt.      Objective:     Vitals: There were no vitals taken for this visit.  There is no height or weight on file to calculate BMI.  Advanced Directives 05/19/2017 12/07/2015 09/24/2015 09/17/2015 04/06/2015 09/23/2014 06/02/2014  Does Patient Have a Medical Advance Directive? No No No No No No No  Would patient like information on creating a medical advance directive? No - Patient declined No - patient declined information - No - patient declined information No - patient declined information No - patient declined information -    Tobacco Social History   Tobacco Use  Smoking Status Current Every Day Smoker  . Packs/day: 0.50  . Years: 50.00  . Pack years: 25.00  . Types: Cigarettes  Smokeless Tobacco Never Used     Ready to quit: Not Answered Counseling given: Not Answered  Past Medical History:  Diagnosis Date  . Abnormal imaging of thyroid 04/06/2015  . Anemia   . Anxiety   . Benign hypertension   . CAD (coronary artery disease)   . Chronic diarrhea   . Constipation   . Depressive disorder   . DJD (degenerative joint disease)   . Duodenitis   . Dysphagia 04/06/2015  . Follicular lymphoma ([place]) 10/07/2013  . Gastritis   . GERD (gastroesophageal reflux disease)   . Hyperlipidemia   . IBS (irritable bowel syndrome)   . Insomnia  02/28/2014  . Internal hemorrhoids   . Lymphadenopathy 10/22/2012  . Monoclonal gammopathy   . Sore throat 04/06/2015  . Urge incontinence   . Vitamin B12 deficiency    Past Surgical History:  Procedure Laterality Date  . ABDOMINAL HYSTERECTOMY  1992   with ovarian cystectomy  . CORONARY STENT PLACEMENT    . HEMICOLECTOMY    . ileocolonic anasomosis    . SPINE SURGERY     Family History  Problem Relation Age of Onset  . Kidney disease Mother   . Stroke Mother   . Diabetes Unknown        sibling  . Colon cancer Neg Hx    Social History   Socioeconomic History  . Marital status: Married    Spouse name: Not on file  . Number of children: 2  . Years of education: Not on file  . Highest education level: Not on file  Occupational History  . Occupation: retired  Scientific laboratory technician  . Financial resource strain: Not on file  . Food insecurity:    Worry: Not on file    Inability: Not on file  . Transportation needs:    Medical: Not on file    Non-medical: Not on file  Tobacco Use  . Smoking status: Current Every Day Smoker    Packs/day: 0.50    Years: 50.00    Pack years: 25.00    Types: Cigarettes  . Smokeless tobacco: Never Used  Substance and Sexual Activity  . Alcohol use:  No  . Drug use: No  . Sexual activity: Not on file  Lifestyle  . Physical activity:    Days per week: Not on file    Minutes per session: Not on file  . Stress: Not on file  Relationships  . Social connections:    Talks on phone: Not on file    Gets together: Not on file    Attends religious service: Not on file    Active member of club or organization: Not on file    Attends meetings of clubs or organizations: Not on file    Relationship status: Not on file  Other Topics Concern  . Not on file  Social History Narrative  . Not on file    Outpatient Encounter Medications as of 09/06/2017  Medication Sig  . aspirin 81 MG tablet Take 81 mg by mouth daily.    .  butalbital-acetaminophen-caffeine (FIORICET, ESGIC) 50-325-40 MG tablet TAKE 1 TO 2 TABLETS BY MOUTH EVERY 6 HOURS AS NEEDED FOR HEADACHE  . clopidogrel (PLAVIX) 75 MG tablet Take 75 mg by mouth daily.  . cyclobenzaprine (FLEXERIL) 5 MG tablet Take 1 tablet (5 mg total) by mouth at bedtime.  . docusate sodium (COLACE) 50 MG capsule Take 1 capsule (50 mg total) by mouth 2 (two) times daily.  [name] HYDROcodone-acetaminophen (NORCO) 10-325 MG tablet Take 1 tablet by mouth 2 (two) times daily as needed for pain.  [name] LORazepam (ATIVAN) 1 MG tablet TAKE 1 TABLET BY MOUTH TWICE DAILY AS NEEDED FOR ANXIETY  . losartan (COZAAR) 100 MG tablet Take 1 tablet (100 mg total) by mouth daily.  . mirtazapine (REMERON) 15 MG tablet Take 15 mg by mouth at bedtime as needed (sleep).  . naproxen (NAPROSYN) 375 MG tablet Take 1 tablet (375 mg total) by mouth 2 (two) times daily as needed for moderate pain.  . nitroGLYCERIN (NITROLINGUAL) 0.4 MG/SPRAY spray Place 1 spray under the tongue every 5 (five) minutes x 3 doses as needed for chest pain.  [name] omeprazole (PRILOSEC) 20 MG capsule Take 1 capsule (20 mg total) by mouth daily.  [name] oxybutynin (DITROPAN) 5 MG tablet Take 5 mg by mouth daily.  . polyethylene glycol (MIRALAX / GLYCOLAX) packet Take 17 g by mouth daily.  . potassium chloride (K-DUR) 10 MEQ tablet Take 10 mEq by mouth daily.   . promethazine (PHENERGAN) 25 MG tablet TAKE 1 TABLET(25 MG) BY MOUTH EVERY 6 HOURS AS NEEDED FOR MIGRAINE (Patient not taking: Reported on 05/19/2017)   No facility-administered encounter medications on file as of 09/06/2017.     Activities of Daily Living In your present state of health, do you have any difficulty performing the following activities: 05/19/2017  Hearing? Y  Vision? Y  Difficulty concentrating or making decisions? N  Walking or climbing stairs? Y  Dressing or bathing? N  Doing errands, shopping? N  Some recent data might be hidden    Patient Care Team: Hoyt Koch, MD as PCP - General (Internal Medicine)    Assessment:   This is a routine wellness examination for [place]ia. Physical assessment deferred to PCP.   Exercise Activities and Dietary recommendations   Diet (meal preparation, eat out, water intake, caffeinated beverages, dairy products, fruits and vegetables): {Desc; diets:16563}   Goals   None     Fall Risk Fall Risk  11/25/2014  Falls in the past year? Yes  Number falls in past yr: 2 or more  Injury with Fall? No   Depression  Screen PHQ 2/9 Scores 05/26/2016 11/25/2014  PHQ - 2 Score 0 0  PHQ- 9 Score 2 -     Cognitive Function        Immunization History  Administered Date(s) Administered  . Influenza, High Dose Seasonal PF 11/18/2014  . Influenza,inj,Quad PF,6+ Mos 12/07/2015  . Pneumococcal Conjugate-13 05/16/2016  . Pneumococcal Polysaccharide-23 04/24/2004  . Td 04/24/2004, 02/19/2008   Screening Tests Health Maintenance  Topic Date Due  . DEXA SCAN  12/26/2002  . INFLUENZA VACCINE  10/27/2017 (Originally 08/24/2017)  . TETANUS/TDAP  02/18/2018  . PNA vac Low Risk Adult  Completed      Plan:    I have personally reviewed and noted the following in the patient's chart:   . Medical and social history . Use of alcohol, tobacco or illicit drugs  . Current medications and supplements . Functional ability and status . Nutritional status . Physical activity . Advanced directives . List of other physicians . Vitals . Screenings to include cognitive, depression, and falls . Referrals and appointments  In addition, I have reviewed and discussed with patient certain preventive protocols, quality metrics, and best practice recommendations. A written personalized care plan for preventive services as well as general preventive health recommendations were provided to patient.     Michiel Cowboy, RN  09/05/2017

## 2017-09-06 ENCOUNTER — Ambulatory Visit: Payer: Medicare HMO

## 2017-09-14 ENCOUNTER — Other Ambulatory Visit: Payer: Self-pay | Admitting: Hematology and Oncology

## 2017-09-14 ENCOUNTER — Inpatient Hospital Stay (HOSPITAL_BASED_OUTPATIENT_CLINIC_OR_DEPARTMENT_OTHER): Payer: Medicare HMO | Admitting: Hematology and Oncology

## 2017-09-14 ENCOUNTER — Telehealth: Payer: Self-pay | Admitting: Hematology and Oncology

## 2017-09-14 ENCOUNTER — Ambulatory Visit: Payer: Medicare HMO

## 2017-09-14 ENCOUNTER — Inpatient Hospital Stay: Payer: Medicare HMO | Attending: Hematology and Oncology

## 2017-09-14 DIAGNOSIS — R2232 Localized swelling, mass and lump, left upper limb: Secondary | ICD-10-CM

## 2017-09-14 DIAGNOSIS — Z8572 Personal history of non-Hodgkin lymphomas: Secondary | ICD-10-CM | POA: Diagnosis not present

## 2017-09-14 DIAGNOSIS — F17200 Nicotine dependence, unspecified, uncomplicated: Secondary | ICD-10-CM

## 2017-09-14 DIAGNOSIS — R69 Illness, unspecified: Secondary | ICD-10-CM | POA: Diagnosis not present

## 2017-09-14 LAB — CBC WITH DIFFERENTIAL/PLATELET
BASOS ABS: 0 10*3/uL (ref 0.0–0.1)
BASOS PCT: 1 %
EOS ABS: 0.2 10*3/uL (ref 0.0–0.5)
Eosinophils Relative: 4 %
HCT: 39.4 % (ref 34.8–46.6)
Hemoglobin: 13 g/dL (ref 11.6–15.9)
Lymphocytes Relative: 44 %
Lymphs Abs: 2.3 10*3/uL (ref 0.9–3.3)
MCH: 31.4 pg (ref 25.1–34.0)
MCHC: 33 g/dL (ref 31.5–36.0)
MCV: 95 fL (ref 79.5–101.0)
Monocytes Absolute: 0.4 10*3/uL (ref 0.1–0.9)
Monocytes Relative: 9 %
Neutro Abs: 2.1 10*3/uL (ref 1.5–6.5)
Neutrophils Relative %: 42 %
Platelets: 166 10*3/uL (ref 145–400)
RBC: 4.14 MIL/uL (ref 3.70–5.45)
RDW: 13.8 % (ref 11.2–14.5)
WBC: 5.1 10*3/uL (ref 3.9–10.3)

## 2017-09-14 LAB — COMPREHENSIVE METABOLIC PANEL
ALK PHOS: 123 U/L (ref 38–126)
ALT: 18 U/L (ref 0–44)
AST: 18 U/L (ref 15–41)
Albumin: 3.7 g/dL (ref 3.5–5.0)
Anion gap: 8 (ref 5–15)
BILIRUBIN TOTAL: 0.5 mg/dL (ref 0.3–1.2)
BUN: 10 mg/dL (ref 8–23)
CALCIUM: 9 mg/dL (ref 8.9–10.3)
CO2: 27 mmol/L (ref 22–32)
CREATININE: 0.84 mg/dL (ref 0.44–1.00)
Chloride: 108 mmol/L (ref 98–111)
GFR calc Af Amer: >60 mL/min (ref >60)
Glucose, Bld: 97 mg/dL (ref 70–99)
POTASSIUM: 3.6 mmol/L (ref 3.5–5.1)
Sodium: 143 mmol/L (ref 135–145)
TOTAL PROTEIN: 7.3 g/dL (ref 6.5–8.1)

## 2017-09-14 LAB — LACTATE DEHYDROGENASE: LDH: 211 U/L — AB (ref 98–192)

## 2017-09-14 NOTE — Telephone Encounter (Signed)
Gave avs and calendar ° °

## 2017-09-15 ENCOUNTER — Encounter: Payer: Self-pay | Admitting: Hematology and Oncology

## 2017-09-15 NOTE — Assessment & Plan Note (Signed)
Examination is consistent with lipoma I do not recommend surgery if it does not bother her due to risk of surgery

## 2017-09-15 NOTE — Progress Notes (Signed)
[facility] OFFICE PROGRESS NOTE  Patient Care Team: Hoyt Koch, MD as PCP - General (Internal Medicine)  ASSESSMENT & PLAN:  History of B-cell lymphoma Clinically, she had no signs of cancer recurrence I will see her back next year for repeat history, physical examination and blood work for her lymphoma follow-up. I recommend annual influenza vaccination.  TOBACCO DEPENDENCE I spent some time counseling the patient the importance of tobacco cessation. she is currently attempting to quit on her own  Mass of left forearm Examination is consistent with lipoma I do not recommend surgery if it does not bother her due to risk of surgery   No orders of the defined types were placed in this encounter.   INTERVAL HISTORY: Please see below for problem oriented charting. She returns for further follow-up She continues to have chronic musculoskeletal pain She denies recent infection, fever or chills No new lymphadenopathy Her appetite has been stable She has persistent palpable lump on the left forearm She continues to smoke but is attempting to quit smoking on her own  SUMMARY OF ONCOLOGIC HISTORY: Oncology History   Follicular lymphoma, FLIPI score of 3 due to age >18, extranodal disease >4 and stage IV at presentation   Primary site: Lymphoid Neoplasms   Staging method: AJCC 6th Edition   Clinical: Stage IV signed by Heath Lark, MD on 10/07/2013  3:59 PM   Summary: Stage IV       History of B-cell lymphoma   09/03/2013 Imaging    PET/CT scan showed diffuse lymphadenopathy and splenomegaly.    09/27/2013 Bone Marrow Biopsy    Bone marrow biopsy show lymphoma involvement most consistent with follicular lymphoma.    10/18/2013 - 11/08/2013 Chemotherapy    She is given rituximab with prednisone    12/23/2013 Imaging    Repeat PET/CT scan showed near-complete response.    03/24/2014 Imaging    Repeat PET CT scan showed no evidence of cancer    03/26/2015 Imaging    PET scan is negative     REVIEW OF SYSTEMS:   Constitutional: Denies fevers, chills or abnormal weight loss Eyes: Denies blurriness of vision Ears, nose, mouth, throat, and face: Denies mucositis or sore throat Respiratory: Denies cough, dyspnea or wheezes Cardiovascular: Denies palpitation, chest discomfort or lower extremity swelling Gastrointestinal:  Denies nausea, heartburn or change in bowel habits Skin: Denies abnormal skin rashes Lymphatics: Denies new lymphadenopathy or easy bruising Neurological:Denies numbness, tingling or new weaknesses Behavioral/Psych: Mood is stable, no new changes  All other systems were reviewed with the patient and are negative.  I have reviewed the past medical history, past surgical history, social history and family history with the patient and they are unchanged from previous note.  ALLERGIES:  has No Known Allergies.  MEDICATIONS:  Current Outpatient Medications  Medication Sig Dispense Refill  . aspirin 81 MG tablet Take 81 mg by mouth daily.      . butalbital-acetaminophen-caffeine (FIORICET, ESGIC) 50-325-40 MG tablet TAKE 1 TO 2 TABLETS BY MOUTH EVERY 6 HOURS AS NEEDED FOR HEADACHE 20 tablet 0  . HYDROcodone-acetaminophen (NORCO) 10-325 MG tablet Take 1 tablet by mouth 2 (two) times daily as needed for pain.  0  . LORazepam (ATIVAN) 1 MG tablet TAKE 1 TABLET BY MOUTH TWICE DAILY AS NEEDED FOR ANXIETY 60 tablet 3  . losartan (COZAAR) 100 MG tablet Take 1 tablet (100 mg total) by mouth daily.    . nitroGLYCERIN (NITROLINGUAL) 0.4 MG/SPRAY spray Place 1 spray  under the tongue every 5 (five) minutes x 3 doses as needed for chest pain.    [name] oxybutynin (DITROPAN) 5 MG tablet Take 5 mg by mouth daily.  3  . polyethylene glycol (MIRALAX / GLYCOLAX) packet Take 17 g by mouth daily. 14 each 0  . promethazine (PHENERGAN) 25 MG tablet TAKE 1 TABLET(25 MG) BY MOUTH EVERY 6 HOURS AS NEEDED FOR MIGRAINE (Patient not taking: Reported on  05/19/2017) 15 tablet 0   No current facility-administered medications for this visit.     PHYSICAL EXAMINATION: ECOG PERFORMANCE STATUS: 1 - Symptomatic but completely ambulatory  Vitals:   09/14/17 1450  BP: 134/67  Pulse: (!) 101  Resp: 18  Temp: 98.2 F (36.8 C)  SpO2: 100%   Filed Weights   09/14/17 1450  Weight: 148 lb 12.8 oz (67.5 kg)    GENERAL:alert, no distress and comfortable SKIN: skin color, texture, turgor are normal, no rashes or significant lesions EYES: normal, Conjunctiva are pink and non-injected, sclera clear OROPHARYNX:no exudate, no erythema and lips, buccal mucosa, and tongue normal  NECK: supple, thyroid normal size, non-tender, without nodularity LYMPH:  no palpable lymphadenopathy in the cervical, axillary or inguinal LUNGS: clear to auscultation and percussion with normal breathing effort HEART: regular rate & rhythm and no murmurs and no lower extremity edema ABDOMEN:abdomen soft, non-tender and normal bowel sounds Musculoskeletal:no cyanosis of digits and no clubbing.  The palpable lump on her forearm is most consistent with a lipoma NEURO: alert & oriented x 3 with fluent speech, no focal motor/sensory deficits  LABORATORY DATA:  I have reviewed the data as listed    Component Value Date/Time   NA 143 09/14/2017 1430   NA 142 06/09/2016 1448   K 3.6 09/14/2017 1430   K 3.8 06/09/2016 1448   CL 108 09/14/2017 1430   CO2 27 09/14/2017 1430   CO2 23 06/09/2016 1448   GLUCOSE 97 09/14/2017 1430   GLUCOSE 117 06/09/2016 1448   BUN 10 09/14/2017 1430   BUN 14.1 06/09/2016 1448   CREATININE 0.84 09/14/2017 1430   CREATININE 0.9 06/09/2016 1448   CALCIUM 9.0 09/14/2017 1430   CALCIUM 9.3 06/09/2016 1448   PROT 7.3 09/14/2017 1430   PROT 7.5 06/09/2016 1448   ALBUMIN 3.7 09/14/2017 1430   ALBUMIN 3.9 06/09/2016 1448   AST 18 09/14/2017 1430   AST 17 06/09/2016 1448   ALT 18 09/14/2017 1430   ALT 13 06/09/2016 1448   ALKPHOS 123  09/14/2017 1430   ALKPHOS 93 06/09/2016 1448   BILITOT 0.5 09/14/2017 1430   BILITOT 0.75 06/09/2016 1448   GFRNONAA >60 09/14/2017 1430   GFRAA >60 09/14/2017 1430    No results found for: SPEP, UPEP  Lab Results  Component Value Date   WBC 5.1 09/14/2017   NEUTROABS 2.1 09/14/2017   HGB 13.0 09/14/2017   HCT 39.4 09/14/2017   MCV 95.0 09/14/2017   PLT 166 09/14/2017      Chemistry      Component Value Date/Time   NA 143 09/14/2017 1430   NA 142 06/09/2016 1448   K 3.6 09/14/2017 1430   K 3.8 06/09/2016 1448   CL 108 09/14/2017 1430   CO2 27 09/14/2017 1430   CO2 23 06/09/2016 1448   BUN 10 09/14/2017 1430   BUN 14.1 06/09/2016 1448   CREATININE 0.84 09/14/2017 1430   CREATININE 0.9 06/09/2016 1448      Component Value Date/Time   CALCIUM 9.0 09/14/2017 1430  CALCIUM 9.3 06/09/2016 1448   ALKPHOS 123 09/14/2017 1430   ALKPHOS 93 06/09/2016 1448   AST 18 09/14/2017 1430   AST 17 06/09/2016 1448   ALT 18 09/14/2017 1430   ALT 13 06/09/2016 1448   BILITOT 0.5 09/14/2017 1430   BILITOT 0.75 06/09/2016 1448      All questions were answered. The patient knows to call the clinic with any problems, questions or concerns. No barriers to learning was detected.  I spent 15 minutes counseling the patient face to face. The total time spent in the appointment was 20 minutes and more than 50% was on counseling and review of test results  Heath Lark, MD 09/15/2017 9:05 AM

## 2017-09-15 NOTE — Assessment & Plan Note (Signed)
I spent some time counseling the patient the importance of tobacco cessation. she is currently attempting to quit on her own 

## 2017-09-15 NOTE — Assessment & Plan Note (Signed)
Clinically, she had no signs of cancer recurrence I will see her back next year for repeat history, physical examination and blood work for her lymphoma follow-up. I recommend annual influenza vaccination.

## 2017-11-22 ENCOUNTER — Telehealth: Payer: Self-pay | Admitting: Internal Medicine

## 2017-11-27 ENCOUNTER — Other Ambulatory Visit: Payer: Self-pay | Admitting: Internal Medicine

## 2017-11-27 NOTE — Telephone Encounter (Signed)
Check [facility] registry last filled 10/29/2017.Marland KitchenJohny Guerrero

## 2017-11-27 NOTE — Telephone Encounter (Signed)
See refill encounter for 11/27/17.

## 2017-11-27 NOTE — Telephone Encounter (Signed)
Pt is calling in to follow up on her refill request. Pt says she is completely out of her medication. Pt would like further assistance with her refill   Please advise.

## 2017-11-28 NOTE — Telephone Encounter (Signed)
She is filling early every month and should not be out until 12/08/17. Needs visit for refill.

## 2017-12-01 ENCOUNTER — Ambulatory Visit (INDEPENDENT_AMBULATORY_CARE_PROVIDER_SITE_OTHER): Payer: Medicare HMO | Admitting: Internal Medicine

## 2017-12-01 ENCOUNTER — Encounter: Payer: Self-pay | Admitting: Internal Medicine

## 2017-12-01 DIAGNOSIS — R69 Illness, unspecified: Secondary | ICD-10-CM | POA: Diagnosis not present

## 2017-12-01 DIAGNOSIS — F411 Generalized anxiety disorder: Secondary | ICD-10-CM | POA: Diagnosis not present

## 2017-12-01 MED ORDER — LORAZEPAM 1 MG PO TABS: ORAL_TABLET | ORAL | 3 refills | Status: DC

## 2017-12-01 NOTE — Assessment & Plan Note (Addendum)
Given that she is not able to explain where the missing almost 40 extra pills over the last 6 months are and she denies missing medication or overtaking I just do not feel that this is safe. She is very high risk with concurrent hydrocodone #180 every 1-2 months 10/325 for accidental overdose. She is unwilling to consider or take any other medications so I am not able to prescribe other medications for her for her depression. It does not sound like she has anxiety and she states that the lorazepam gives her energy. Will taper and do daily for 1 week then 1/2 pill daily for 1 week 11 pills prescribed today and then she will stop.

## 2017-12-01 NOTE — Patient Instructions (Signed)
As we talked about it is very dangerous to take more medication that you should and it worries me that you are filling the medication early every month and do not have those pills left somewhere. This is not okay.   We will work to get you off this medication as I am not sure it is safe for you anymore. We will send in today the lorazepam to take 1 pill daily for 1 week, then 1/2 pill daily for 1 week then stop.

## 2017-12-01 NOTE — Progress Notes (Signed)
   Subjective:    Patient ID: [name], female    DOB: May 21, 1935, 82 y.o.   MRN: [id number]  HPI The patient is a 82 YO female coming in for several concerns including most importantly she is out of lorazepam (given 4 month supply in mid July and she is out for about 1 week already, should not be out until 12/07/17, she denies several times taking more than usual, she does not take more than 2 per day, denies losing any or misplacing it, she states that since being out she has no energy and is some depressed, she denies anxiety or panic attacks, no rapid heart rate, has maybe tried other things for this in the past but does not know names and is not willing to try anything else for this, she is on concurrent hydrocodone and gets #180 per 1-2 months, also denies mistaking this)  Review of Systems  Constitutional: Positive for fatigue.  HENT: Negative.   Eyes: Negative.   Respiratory: Negative for cough, chest tightness and shortness of breath.   Cardiovascular: Negative for chest pain, palpitations and leg swelling.  Gastrointestinal: Negative for abdominal distention, abdominal pain, constipation, diarrhea, nausea and vomiting.  Musculoskeletal: Negative.   Skin: Negative.   Neurological: Negative.   Psychiatric/Behavioral: Positive for dysphoric mood. Negative for agitation, behavioral problems, hallucinations, self-injury, sleep disturbance and suicidal ideas. The patient is not nervous/anxious and is not hyperactive.       Objective:   Physical Exam  Constitutional: She is oriented to person, place, and time. She appears well-developed and well-nourished.  HENT:  Head: Normocephalic and atraumatic.  Eyes: EOM are normal.  Neck: Normal range of motion.  Cardiovascular: Normal rate and regular rhythm.  Pulmonary/Chest: Effort normal and breath sounds normal. No respiratory distress. She has no wheezes. She has no rales.  Abdominal: Soft. Bowel sounds are normal. She exhibits no  distension. There is no tenderness. There is no rebound.  Musculoskeletal: She exhibits no edema.  Neurological: She is alert and oriented to person, place, and time. Coordination normal.  Skin: Skin is warm and dry.  Psychiatric: She has a normal mood and affect.   Vitals:   12/01/17 1427  BP: (!) 90/58  Pulse: 68  Temp: 97.8 F (36.6 C)  TempSrc: Oral  SpO2: 98%  Weight: 143 lb (64.9 kg)  Height: 5\' 3"  (1.6 m)      Assessment & Plan:  Visit time 25 minutes: greater than 50% of that time was spent in face to face counseling and coordination of care with the patient: counseled about serious nature of taking controlled substances, the high risk of accidental overdose with the frequency of her hydrocodone and ativan together, questioning about missing medication and how often she is taking it

## 2017-12-13 DIAGNOSIS — I10 Essential (primary) hypertension: Secondary | ICD-10-CM | POA: Diagnosis not present

## 2017-12-13 DIAGNOSIS — I2510 Atherosclerotic heart disease of native coronary artery without angina pectoris: Secondary | ICD-10-CM | POA: Diagnosis not present

## 2017-12-13 DIAGNOSIS — J208 Acute bronchitis due to other specified organisms: Secondary | ICD-10-CM | POA: Diagnosis not present

## 2017-12-13 DIAGNOSIS — R072 Precordial pain: Secondary | ICD-10-CM | POA: Diagnosis not present

## 2017-12-13 DIAGNOSIS — M545 Low back pain: Secondary | ICD-10-CM | POA: Diagnosis not present

## 2017-12-18 ENCOUNTER — Ambulatory Visit: Payer: Medicare Other | Admitting: Podiatry

## 2017-12-19 ENCOUNTER — Ambulatory Visit: Payer: Medicare HMO | Admitting: Internal Medicine

## 2017-12-20 ENCOUNTER — Ambulatory Visit: Payer: Self-pay | Admitting: Podiatry

## 2017-12-28 ENCOUNTER — Encounter: Payer: Self-pay | Admitting: Podiatry

## 2017-12-28 ENCOUNTER — Ambulatory Visit: Payer: Medicare HMO | Admitting: Podiatry

## 2017-12-28 DIAGNOSIS — B351 Tinea unguium: Secondary | ICD-10-CM | POA: Diagnosis not present

## 2017-12-28 DIAGNOSIS — Q828 Other specified congenital malformations of skin: Secondary | ICD-10-CM | POA: Diagnosis not present

## 2017-12-28 DIAGNOSIS — M79674 Pain in right toe(s): Secondary | ICD-10-CM | POA: Diagnosis not present

## 2017-12-28 DIAGNOSIS — M79675 Pain in left toe(s): Secondary | ICD-10-CM

## 2017-12-28 DIAGNOSIS — L03031 Cellulitis of right toe: Secondary | ICD-10-CM

## 2017-12-31 NOTE — Progress Notes (Signed)
Subjective:   Patient ID: [name], female   DOB: 82 y.o.   MRN: [id number]   HPI Patient presents with a irritated red hallux nail medial border left with localized drainage noted with no proximal edema erythema noted upon evaluation but she states is very sore with thick yellow brittle nailbeds 1-5 both feet and keratotic lesions underneath the fifth metatarsals of both feet that are painful   ROS      Objective:  Physical Exam  Diminishment of vascular status was noted with weak PT DP pulses with hair growth noted and patient is noted left hallux medial border to be incurvated with redness and drainage and also was found to have thickened nails 1-5 both feet that are dystrophic and hard for her to wear shoe gear with.  Lesion sub-fifth metatarsal that are painful     Assessment:  Moderate at risk patient with low-grade vascular disease with paronychia infection localized left hallux and mycotic nail infection and lesion formation     Plan:  H&P all conditions reviewed and at this point will get a focus on the nail and I infiltrated 60 mg like Marcaine mixture using sterile instrumentation remove the medial border exposed the tissue and reduced all proud flesh abscess tissue allow channel for drainage gave instructions on soaks and also went ahead and applied sterile dressing.  Debrided remaining nails and lesions underneath the fifth metatarsal which she tolerated well and we will see if we can hopefully prevent her from needing permanent procedure

## 2018-01-25 ENCOUNTER — Other Ambulatory Visit: Payer: Self-pay | Admitting: Internal Medicine

## 2018-02-02 ENCOUNTER — Encounter: Payer: Self-pay | Admitting: Internal Medicine

## 2018-02-02 ENCOUNTER — Ambulatory Visit (INDEPENDENT_AMBULATORY_CARE_PROVIDER_SITE_OTHER): Payer: Medicare HMO | Admitting: Internal Medicine

## 2018-02-02 DIAGNOSIS — R69 Illness, unspecified: Secondary | ICD-10-CM | POA: Diagnosis not present

## 2018-02-02 DIAGNOSIS — F411 Generalized anxiety disorder: Secondary | ICD-10-CM

## 2018-02-02 MED ORDER — SERTRALINE HCL 50 MG PO TABS: 50.0000 mg | ORAL_TABLET | Freq: Every day | ORAL | 3 refills | Status: DC

## 2018-02-02 NOTE — Assessment & Plan Note (Signed)
Given her description of lack of energy and drive and motivation sounds to be more depression than anxiety and ativan is not appropriate. I will not prescribe her any benzodiazepines given the discordant history and consistent pattern of early fills without good explanation and concurrent hydrocodone usage also without a clear timeline of her taking medication. Rx for zoloft to help with depression symptoms.

## 2018-02-02 NOTE — Progress Notes (Signed)
   Subjective:   Patient ID: [name], female    DOB: February 22, 1935, 83 y.o.   MRN: [id number]  HPI The patient is an 83 YO female coming in for follow up of her ativan. We had stopped this at last visit about 2 months ago with taper given that she was consistently filling early and could not explain why and she is on high risk medication hydrocodone #180 every 1-2 months. She today denies that she takes that often and that it gives her constipation so she does not take except rarely. She cannot explain why she is filling that often if she does not take. She also cannot explain why she is running out early. Still denies taking inappropriately. She does not feel able to fill out PHQ-9. States she is staying in her room more. Denies panic although feels more startled if someone comes in a room she is in. She denies panic attacks. She denies rapid heart beat. Denies withdrawal symptoms.   Review of Systems  Constitutional: Negative.   HENT: Negative.   Eyes: Negative.   Respiratory: Negative for cough, chest tightness and shortness of breath.   Cardiovascular: Negative for chest pain, palpitations and leg swelling.  Gastrointestinal: Negative for abdominal distention, abdominal pain, constipation, diarrhea, nausea and vomiting.  Musculoskeletal: Negative.   Skin: Negative.   Neurological: Negative.   Psychiatric/Behavioral: Positive for confusion and dysphoric mood. Negative for self-injury, sleep disturbance and suicidal ideas.    Objective:  Physical Exam Constitutional:      Appearance: She is well-developed.  HENT:     Head: Normocephalic and atraumatic.  Neck:     Musculoskeletal: Normal range of motion.  Cardiovascular:     Rate and Rhythm: Normal rate and regular rhythm.  Pulmonary:     Effort: Pulmonary effort is normal. No respiratory distress.     Breath sounds: Normal breath sounds. No wheezing or rales.  Skin:    General: Skin is warm and dry.  Neurological:     Mental  Status: She is alert and oriented to person, place, and time.     Coordination: Coordination normal.  Psychiatric:     Comments: Could not fill out phq-9 and has difficulty talking about her symptoms     Vitals:   02/02/18 1555  BP: 110/64  Pulse: 65  Temp: 97.7 F (36.5 C)  TempSrc: Oral  SpO2: 99%  Weight: 138 lb (62.6 kg)  Height: 5\' 3"  (1.6 m)    Assessment & Plan:  Visit time 25 minutes: greater than 50% of that time was spent in face to face counseling and coordination of care with the patient: counseled about the serious side effects with taking ativan and hydrocodone and how if she cannot explain when she is taking and why she is running out early that she could easily have an accidental overdose which could kill her.

## 2018-02-02 NOTE — Patient Instructions (Signed)
We have sent in zoloft to help with the way you are feeling.   Take 1 pill daily and let us know how you are feeling in about 2-3 weeks.

## 2018-02-08 ENCOUNTER — Telehealth: Payer: Self-pay | Admitting: Internal Medicine

## 2018-02-08 NOTE — Telephone Encounter (Signed)
Copied from [place] [id number]. Topic: Quick Communication - See Telephone Encounter >> Feb 08, 2018  3:49 PM Ivar Drape wrote: CRM for notification. See Telephone encounter for: 02/08/18. Patient stated she has been taking sertraline (ZOLOFT) 50 MG tablet medication since 02/02/2018 but it seems like it is not helping her.  She can't sleep, she's loosing weight, and she feels nervous from time to time.  Please advise.

## 2018-02-08 NOTE — Telephone Encounter (Signed)
Patient will need to make an appointment with another provider to discuss since Dr. Sharlet Salina is out of town

## 2018-02-09 NOTE — Telephone Encounter (Signed)
Appt made

## 2018-02-13 ENCOUNTER — Ambulatory Visit: Payer: Medicare HMO | Admitting: Family

## 2018-02-13 ENCOUNTER — Ambulatory Visit (INDEPENDENT_AMBULATORY_CARE_PROVIDER_SITE_OTHER): Payer: Medicare HMO | Admitting: Internal Medicine

## 2018-02-13 ENCOUNTER — Encounter: Payer: Self-pay | Admitting: Internal Medicine

## 2018-02-13 DIAGNOSIS — F411 Generalized anxiety disorder: Secondary | ICD-10-CM

## 2018-02-13 DIAGNOSIS — R69 Illness, unspecified: Secondary | ICD-10-CM | POA: Diagnosis not present

## 2018-02-13 MED ORDER — BUSPIRONE HCL 5 MG PO TABS: 5.0000 mg | ORAL_TABLET | Freq: Two times a day (BID) | ORAL | 3 refills | Status: DC

## 2018-02-13 NOTE — Assessment & Plan Note (Signed)
Advised that zoloft takes 1 month to fully work but given side effects she does not want to continue. Will try buspar up to BID prn for anxiety as she does not want to try medicines similar to zoloft. She wants to go back on ativan and I explained to her several times why that is not safe and I will not do that. She is upset about this and I offered psych visit to see if they feel comfortable doing this in the setting of her hydrocodone usage as well as multiple early fills of ativan and discordance between how she states she is taking and pill count. She still maintains that she was not taking more than 1 pill daily but filling #60 sooner than 30 days consistently and could not account for #40 pills over several months due to persistent early fills even when assuming 2 pills daily.

## 2018-02-13 NOTE — Progress Notes (Signed)
   Subjective:   Patient ID: [name], female    DOB: [birth year], 83 y.o.   MRN: [id number]  HPI The patient is an 83 YO female coming in for anxiety. She did start taking ativan and feels it causes a bad taste in her mouth after taking for about an hour. She has not felt any different. Has been taking 3-4 days at this time. Denies SI/HI. She is getting nervous more and staying in her room more. She is not sure why she cannot just go back on her medication. She states she does not hardly take her pain medicine due to it making her constipated. She does admit that she fills it and admits to having several bottles at home as well as migraine medicine from several years ago.   Review of Systems  Constitutional: Negative.   HENT: Negative.   Eyes: Negative.   Respiratory: Negative for cough, chest tightness and shortness of breath.   Cardiovascular: Negative for chest pain, palpitations and leg swelling.  Gastrointestinal: Negative for abdominal distention, abdominal pain, constipation, diarrhea, nausea and vomiting.  Musculoskeletal: Negative.   Skin: Negative.   Neurological: Negative.   Psychiatric/Behavioral: Positive for decreased concentration, dysphoric mood and sleep disturbance. The patient is nervous/anxious.     Objective:  Physical Exam Constitutional:      Appearance: She is well-developed.  HENT:     Head: Normocephalic and atraumatic.  Neck:     Musculoskeletal: Normal range of motion.  Cardiovascular:     Rate and Rhythm: Normal rate and regular rhythm.  Pulmonary:     Effort: Pulmonary effort is normal. No respiratory distress.     Breath sounds: Normal breath sounds. No wheezing or rales.  Abdominal:     General: Bowel sounds are normal. There is no distension.     Palpations: Abdomen is soft.     Tenderness: There is no abdominal tenderness. There is no rebound.  Skin:    General: Skin is warm and dry.  Neurological:     Mental Status: She is alert.   Coordination: Coordination normal.     Comments: Some poor recall and no recollection of our previous 2-3 talks about her dangerous usage of medications     Vitals:   02/13/18 1540  BP: 130/60  Pulse: 84  Temp: 97.6 F (36.4 C)  TempSrc: Oral  Weight: 138 lb (62.6 kg)  Height: 5\' 3"  (1.6 m)    Assessment & Plan:  Visit time 25 minutes: greater than 50% of that time was spent in face to face counseling and coordination of care with the patient: counseled about the concerns I have about her usage of ativan and hydrocodone as well as her reported usage which does not match fill pattern and her consistent early fills of ativan and not being able to account for lorazepam usage

## 2018-02-13 NOTE — Patient Instructions (Addendum)
We have sent in buspar which is a medicine you can take as needed for anxiety up to 2 times per day.   Melatonin over the counter is safe to help with sleep.   As we have told you because you were not taking the ativan (lorazepam) right and could not account for where pills were going as well as also taking the hydrocodone we cannot give that to you.   You could call and see a psychiatrist as they may feel comfortable giving you the lorazepam (ativan) as they are specialists in anxiety and depression.

## 2018-02-15 DIAGNOSIS — M5416 Radiculopathy, lumbar region: Secondary | ICD-10-CM | POA: Diagnosis not present

## 2018-02-15 DIAGNOSIS — Z79891 Long term (current) use of opiate analgesic: Secondary | ICD-10-CM | POA: Diagnosis not present

## 2018-07-12 DIAGNOSIS — R072 Precordial pain: Secondary | ICD-10-CM | POA: Diagnosis not present

## 2018-07-12 DIAGNOSIS — I2510 Atherosclerotic heart disease of native coronary artery without angina pectoris: Secondary | ICD-10-CM | POA: Diagnosis not present

## 2018-07-12 DIAGNOSIS — I10 Essential (primary) hypertension: Secondary | ICD-10-CM | POA: Diagnosis not present

## 2018-07-12 DIAGNOSIS — M545 Low back pain: Secondary | ICD-10-CM | POA: Diagnosis not present

## 2018-08-13 ENCOUNTER — Other Ambulatory Visit: Payer: Self-pay | Admitting: Internal Medicine

## 2018-08-13 DIAGNOSIS — I10 Essential (primary) hypertension: Secondary | ICD-10-CM | POA: Diagnosis not present

## 2018-08-13 DIAGNOSIS — I2510 Atherosclerotic heart disease of native coronary artery without angina pectoris: Secondary | ICD-10-CM | POA: Diagnosis not present

## 2018-08-13 DIAGNOSIS — R072 Precordial pain: Secondary | ICD-10-CM | POA: Diagnosis not present

## 2018-08-13 DIAGNOSIS — M545 Low back pain: Secondary | ICD-10-CM | POA: Diagnosis not present

## 2018-08-16 DIAGNOSIS — M48061 Spinal stenosis, lumbar region without neurogenic claudication: Secondary | ICD-10-CM | POA: Diagnosis not present

## 2018-08-16 DIAGNOSIS — Z79891 Long term (current) use of opiate analgesic: Secondary | ICD-10-CM | POA: Diagnosis not present

## 2018-08-21 DIAGNOSIS — E876 Hypokalemia: Secondary | ICD-10-CM | POA: Diagnosis not present

## 2018-08-21 DIAGNOSIS — Z79899 Other long term (current) drug therapy: Secondary | ICD-10-CM | POA: Diagnosis not present

## 2018-08-21 DIAGNOSIS — E559 Vitamin D deficiency, unspecified: Secondary | ICD-10-CM | POA: Diagnosis not present

## 2018-08-21 DIAGNOSIS — D649 Anemia, unspecified: Secondary | ICD-10-CM | POA: Diagnosis not present

## 2018-08-21 DIAGNOSIS — E7849 Other hyperlipidemia: Secondary | ICD-10-CM | POA: Diagnosis not present

## 2018-09-13 DIAGNOSIS — R072 Precordial pain: Secondary | ICD-10-CM | POA: Diagnosis not present

## 2018-09-13 DIAGNOSIS — M545 Low back pain: Secondary | ICD-10-CM | POA: Diagnosis not present

## 2018-09-13 DIAGNOSIS — I2510 Atherosclerotic heart disease of native coronary artery without angina pectoris: Secondary | ICD-10-CM | POA: Diagnosis not present

## 2018-09-13 DIAGNOSIS — R0602 Shortness of breath: Secondary | ICD-10-CM | POA: Diagnosis not present

## 2018-09-14 ENCOUNTER — Other Ambulatory Visit: Payer: Self-pay | Admitting: Hematology and Oncology

## 2018-09-14 DIAGNOSIS — Z8572 Personal history of non-Hodgkin lymphomas: Secondary | ICD-10-CM

## 2018-09-17 ENCOUNTER — Inpatient Hospital Stay: Payer: Medicare HMO | Admitting: Hematology and Oncology

## 2018-09-17 ENCOUNTER — Inpatient Hospital Stay: Payer: Medicare HMO

## 2018-09-28 ENCOUNTER — Other Ambulatory Visit: Payer: Self-pay

## 2018-09-28 ENCOUNTER — Inpatient Hospital Stay: Payer: Medicare HMO | Attending: Hematology and Oncology

## 2018-09-28 ENCOUNTER — Encounter: Payer: Self-pay | Admitting: Hematology and Oncology

## 2018-09-28 ENCOUNTER — Inpatient Hospital Stay (HOSPITAL_BASED_OUTPATIENT_CLINIC_OR_DEPARTMENT_OTHER): Payer: Medicare HMO | Admitting: Hematology and Oncology

## 2018-09-28 DIAGNOSIS — F17200 Nicotine dependence, unspecified, uncomplicated: Secondary | ICD-10-CM

## 2018-09-28 DIAGNOSIS — Z791 Long term (current) use of non-steroidal anti-inflammatories (NSAID): Secondary | ICD-10-CM | POA: Insufficient documentation

## 2018-09-28 DIAGNOSIS — R69 Illness, unspecified: Secondary | ICD-10-CM | POA: Diagnosis not present

## 2018-09-28 DIAGNOSIS — Z8572 Personal history of non-Hodgkin lymphomas: Secondary | ICD-10-CM | POA: Insufficient documentation

## 2018-09-28 DIAGNOSIS — Z7982 Long term (current) use of aspirin: Secondary | ICD-10-CM | POA: Diagnosis not present

## 2018-09-28 LAB — CBC WITH DIFFERENTIAL/PLATELET
Abs Immature Granulocytes: 0.02 10*3/uL (ref 0.00–0.07)
Basophils Absolute: 0 10*3/uL (ref 0.0–0.1)
Basophils Relative: 1 %
Eosinophils Absolute: 0.2 10*3/uL (ref 0.0–0.5)
Eosinophils Relative: 3 %
HCT: 38.9 % (ref 36.0–46.0)
Hemoglobin: 12.8 g/dL (ref 12.0–15.0)
Immature Granulocytes: 0 %
Lymphocytes Relative: 46 %
Lymphs Abs: 3 10*3/uL (ref 0.7–4.0)
MCH: 31.8 pg (ref 26.0–34.0)
MCHC: 32.9 g/dL (ref 30.0–36.0)
MCV: 96.5 fL (ref 80.0–100.0)
Monocytes Absolute: 0.6 10*3/uL (ref 0.1–1.0)
Monocytes Relative: 9 %
Neutro Abs: 2.6 10*3/uL (ref 1.7–7.7)
Neutrophils Relative %: 41 %
Platelets: 165 10*3/uL (ref 150–400)
RBC: 4.03 MIL/uL (ref 3.87–5.11)
RDW: 13.9 % (ref 11.5–15.5)
WBC: 6.4 10*3/uL (ref 4.0–10.5)
nRBC: 0 % (ref 0.0–0.2)

## 2018-09-28 NOTE — Assessment & Plan Note (Signed)
Clinically, she had no signs of cancer recurrence The patient is a long-term cancer survivor I will discharge her from the clinic and recommend follow-up with primary care doctor only I recommend annual influenza vaccination

## 2018-09-28 NOTE — Progress Notes (Signed)
[facility] OFFICE PROGRESS NOTE  Patient Care Team: Hoyt Koch, MD as PCP - General (Internal Medicine)  ASSESSMENT & PLAN:  History of B-cell lymphoma Clinically, she had no signs of cancer recurrence The patient is a long-term cancer survivor I will discharge her from the clinic and recommend follow-up with primary care doctor only I recommend annual influenza vaccination  TOBACCO DEPENDENCE I spent some time counseling the patient the importance of tobacco cessation. she is currently attempting to quit on her own   No orders of the defined types were placed in this encounter.   INTERVAL HISTORY: Please see below for problem oriented charting. She returns for further follow-up She continues to smoke No new lymphadenopathy  SUMMARY OF ONCOLOGIC HISTORY: Oncology History Overview Note  Follicular lymphoma, FLIPI score of 3 due to age >3, extranodal disease >4 and stage IV at presentation   Primary site: Lymphoid Neoplasms   Staging method: AJCC 6th Edition   Clinical: Stage IV signed by Heath Lark, MD on 10/07/2013  3:59 PM   Summary: Stage IV     History of B-cell lymphoma  09/03/2013 Imaging   PET/CT scan showed diffuse lymphadenopathy and splenomegaly.   09/27/2013 Bone Marrow Biopsy   Bone marrow biopsy show lymphoma involvement most consistent with follicular lymphoma.   10/18/2013 - 11/08/2013 Chemotherapy   She is given rituximab with prednisone   12/23/2013 Imaging   Repeat PET/CT scan showed near-complete response.   03/24/2014 Imaging   Repeat PET CT scan showed no evidence of cancer   03/26/2015 Imaging   PET scan is negative     REVIEW OF SYSTEMS:   Constitutional: Denies fevers, chills or abnormal weight loss Eyes: Denies blurriness of vision Ears, nose, mouth, throat, and face: Denies mucositis or sore throat Respiratory: Denies cough, dyspnea or wheezes Cardiovascular: Denies palpitation, chest discomfort or lower  extremity swelling Gastrointestinal:  Denies nausea, heartburn or change in bowel habits Skin: Denies abnormal skin rashes Lymphatics: Denies new lymphadenopathy or easy bruising Neurological:Denies numbness, tingling or new weaknesses Behavioral/Psych: Mood is stable, no new changes  All other systems were reviewed with the patient and are negative.  I have reviewed the past medical history, past surgical history, social history and family history with the patient and they are unchanged from previous note.  ALLERGIES:  has No Known Allergies.  MEDICATIONS:  Current Outpatient Medications  Medication Sig Dispense Refill  . aspirin 81 MG tablet Take 81 mg by mouth daily.      . busPIRone (BUSPAR) 5 MG tablet Take 1 tablet (5 mg total) by mouth 2 (two) times daily. Follow-up appt is due must see provider for future refills 60 tablet 0  . butalbital-acetaminophen-caffeine (FIORICET, ESGIC) 50-325-40 MG tablet TAKE 1 TO 2 TABLETS BY MOUTH EVERY 6 HOURS AS NEEDED FOR HEADACHE (Patient not taking: Reported on 02/02/2018) 20 tablet 0  . clopidogrel (PLAVIX) 75 MG tablet clopidogrel 75 mg tablet  TK 1 T PO QD    . diclofenac sodium (VOLTAREN) 1 % GEL Voltaren 1 % topical gel  APPLY 2 GRAM TO THE AFFECTED AREA(S) BY TOPICAL ROUTE 4 TIMES PER DAY    . famotidine (PEPCID) 20 MG tablet famotidine 20 mg tablet    . HYDROcodone-acetaminophen (NORCO) 10-325 MG tablet Take 1 tablet by mouth 2 (two) times daily as needed for pain.  0  . nitroGLYCERIN (NITROLINGUAL) 0.4 MG/SPRAY spray Place 1 spray under the tongue every 5 (five) minutes x 3 doses as  needed for chest pain.    [name] oxybutynin (DITROPAN) 5 MG tablet Take 5 mg by mouth daily.  3   No current facility-administered medications for this visit.     PHYSICAL EXAMINATION: ECOG PERFORMANCE STATUS: 0 - Asymptomatic  Vitals:   09/28/18 1201  BP: (!) 144/65  Pulse: 80  Resp: 18  Temp: 98.7 F (37.1 C)  SpO2: 99%   Filed Weights   09/28/18  1201  Weight: 143 lb 3.2 oz (65 kg)    GENERAL:alert, no distress and comfortable SKIN: skin color, texture, turgor are normal, no rashes or significant lesions EYES: normal, Conjunctiva are pink and non-injected, sclera clear OROPHARYNX:no exudate, no erythema and lips, buccal mucosa, and tongue normal  NECK: supple, thyroid normal size, non-tender, without nodularity LYMPH:  no palpable lymphadenopathy in the cervical, axillary or inguinal LUNGS: clear to auscultation and percussion with normal breathing effort HEART: regular rate & rhythm and no murmurs and no lower extremity edema ABDOMEN:abdomen soft, non-tender and normal bowel sounds Musculoskeletal:no cyanosis of digits and no clubbing  NEURO: alert & oriented x 3 with fluent speech, no focal motor/sensory deficits  LABORATORY DATA:  I have reviewed the data as listed    Component Value Date/Time   NA 143 09/14/2017 1430   NA 142 06/09/2016 1448   K 3.6 09/14/2017 1430   K 3.8 06/09/2016 1448   CL 108 09/14/2017 1430   CO2 27 09/14/2017 1430   CO2 23 06/09/2016 1448   GLUCOSE 97 09/14/2017 1430   GLUCOSE 117 06/09/2016 1448   BUN 10 09/14/2017 1430   BUN 14.1 06/09/2016 1448   CREATININE 0.84 09/14/2017 1430   CREATININE 0.9 06/09/2016 1448   CALCIUM 9.0 09/14/2017 1430   CALCIUM 9.3 06/09/2016 1448   PROT 7.3 09/14/2017 1430   PROT 7.5 06/09/2016 1448   ALBUMIN 3.7 09/14/2017 1430   ALBUMIN 3.9 06/09/2016 1448   AST 18 09/14/2017 1430   AST 17 06/09/2016 1448   ALT 18 09/14/2017 1430   ALT 13 06/09/2016 1448   ALKPHOS 123 09/14/2017 1430   ALKPHOS 93 06/09/2016 1448   BILITOT 0.5 09/14/2017 1430   BILITOT 0.75 06/09/2016 1448   GFRNONAA >60 09/14/2017 1430   GFRAA >60 09/14/2017 1430    No results found for: SPEP, UPEP  Lab Results  Component Value Date   WBC 6.4 09/28/2018   NEUTROABS 2.6 09/28/2018   HGB 12.8 09/28/2018   HCT 38.9 09/28/2018   MCV 96.5 09/28/2018   PLT 165 09/28/2018       Chemistry      Component Value Date/Time   NA 143 09/14/2017 1430   NA 142 06/09/2016 1448   K 3.6 09/14/2017 1430   K 3.8 06/09/2016 1448   CL 108 09/14/2017 1430   CO2 27 09/14/2017 1430   CO2 23 06/09/2016 1448   BUN 10 09/14/2017 1430   BUN 14.1 06/09/2016 1448   CREATININE 0.84 09/14/2017 1430   CREATININE 0.9 06/09/2016 1448      Component Value Date/Time   CALCIUM 9.0 09/14/2017 1430   CALCIUM 9.3 06/09/2016 1448   ALKPHOS 123 09/14/2017 1430   ALKPHOS 93 06/09/2016 1448   AST 18 09/14/2017 1430   AST 17 06/09/2016 1448   ALT 18 09/14/2017 1430   ALT 13 06/09/2016 1448   BILITOT 0.5 09/14/2017 1430   BILITOT 0.75 06/09/2016 1448       All questions were answered. The patient knows to call the clinic with any  problems, questions or concerns. No barriers to learning was detected.  I spent 10 minutes counseling the patient face to face. The total time spent in the appointment was 15 minutes and more than 50% was on counseling and review of test results  Heath Lark, MD 09/28/2018 3:50 PM

## 2018-09-28 NOTE — Assessment & Plan Note (Signed)
I spent some time counseling the patient the importance of tobacco cessation. she is currently attempting to quit on her own 

## 2018-10-12 ENCOUNTER — Other Ambulatory Visit: Payer: Self-pay

## 2018-10-12 MED ORDER — BUSPIRONE HCL 5 MG PO TABS: 5.0000 mg | ORAL_TABLET | Freq: Two times a day (BID) | ORAL | 0 refills | Status: DC

## 2018-10-16 DIAGNOSIS — I2510 Atherosclerotic heart disease of native coronary artery without angina pectoris: Secondary | ICD-10-CM | POA: Diagnosis not present

## 2018-10-16 DIAGNOSIS — R072 Precordial pain: Secondary | ICD-10-CM | POA: Diagnosis not present

## 2018-10-16 DIAGNOSIS — I361 Nonrheumatic tricuspid (valve) insufficiency: Secondary | ICD-10-CM | POA: Diagnosis not present

## 2018-10-23 DIAGNOSIS — R69 Illness, unspecified: Secondary | ICD-10-CM | POA: Diagnosis not present

## 2018-11-29 ENCOUNTER — Other Ambulatory Visit: Payer: Self-pay | Admitting: Internal Medicine

## 2019-01-01 ENCOUNTER — Ambulatory Visit: Payer: Medicare HMO | Admitting: Internal Medicine

## 2019-01-04 ENCOUNTER — Other Ambulatory Visit: Payer: Self-pay | Admitting: Internal Medicine

## 2019-01-04 NOTE — Telephone Encounter (Signed)
Forwarding medication refill request to PCP for review. 

## 2019-01-04 NOTE — Telephone Encounter (Signed)
Medication: busPIRone (BUSPAR) 5 MG tablet [id number]   Has the patient contacted their pharmacy? Yes  (Agent: If no, request that the patient contact the pharmacy for the refill.) (Agent: If yes, when and what did the pharmacy advise?)  Preferred Pharmacy (with phone number or street name): [facility] [place], [place]  Phone:  [phone] Fax:  [phone]     Agent: Please be advised that RX refills may take up to 3 business days. We ask that you follow-up with your pharmacy.

## 2019-01-07 DIAGNOSIS — Z79891 Long term (current) use of opiate analgesic: Secondary | ICD-10-CM | POA: Diagnosis not present

## 2019-01-07 MED ORDER — BUSPIRONE HCL 5 MG PO TABS: 5.0000 mg | ORAL_TABLET | Freq: Two times a day (BID) | ORAL | 0 refills | Status: DC

## 2019-01-10 ENCOUNTER — Encounter: Payer: Self-pay | Admitting: Internal Medicine

## 2019-01-10 ENCOUNTER — Ambulatory Visit (INDEPENDENT_AMBULATORY_CARE_PROVIDER_SITE_OTHER): Payer: Medicare HMO | Admitting: Internal Medicine

## 2019-01-10 ENCOUNTER — Other Ambulatory Visit (INDEPENDENT_AMBULATORY_CARE_PROVIDER_SITE_OTHER): Payer: Medicare HMO

## 2019-01-10 ENCOUNTER — Other Ambulatory Visit: Payer: Self-pay

## 2019-01-10 VITALS — BP 130/80 | HR 90 | Temp 98.6°F | Ht 63.0 in | Wt 142.0 lb

## 2019-01-10 DIAGNOSIS — E538 Deficiency of other specified B group vitamins: Secondary | ICD-10-CM

## 2019-01-10 DIAGNOSIS — I10 Essential (primary) hypertension: Secondary | ICD-10-CM | POA: Diagnosis not present

## 2019-01-10 DIAGNOSIS — F411 Generalized anxiety disorder: Secondary | ICD-10-CM | POA: Diagnosis not present

## 2019-01-10 DIAGNOSIS — R69 Illness, unspecified: Secondary | ICD-10-CM | POA: Diagnosis not present

## 2019-01-10 DIAGNOSIS — K5903 Drug induced constipation: Secondary | ICD-10-CM

## 2019-01-10 DIAGNOSIS — F17200 Nicotine dependence, unspecified, uncomplicated: Secondary | ICD-10-CM | POA: Diagnosis not present

## 2019-01-10 DIAGNOSIS — Z0000 Encounter for general adult medical examination without abnormal findings: Secondary | ICD-10-CM | POA: Diagnosis not present

## 2019-01-10 LAB — CBC
HCT: 39.6 % (ref 36.0–46.0)
Hemoglobin: 13.1 g/dL (ref 12.0–15.0)
MCHC: 33.1 g/dL (ref 30.0–36.0)
MCV: 96.1 fl (ref 78.0–100.0)
Platelets: 174 10*3/uL (ref 150.0–400.0)
RBC: 4.12 Mil/uL (ref 3.87–5.11)
RDW: 14.1 % (ref 11.5–15.5)
WBC: 5.3 10*3/uL (ref 4.0–10.5)

## 2019-01-10 LAB — LIPID PANEL
Cholesterol: 185 mg/dL (ref 0–200)
HDL: 41.9 mg/dL (ref >39.00)
LDL Cholesterol: 123 mg/dL — ABNORMAL HIGH (ref 0–99)
NonHDL: 143.17
Total CHOL/HDL Ratio: 4
Triglycerides: 99 mg/dL (ref 0.0–149.0)
VLDL: 19.8 mg/dL (ref 0.0–40.0)

## 2019-01-10 LAB — COMPREHENSIVE METABOLIC PANEL
ALT: 10 U/L (ref 0–35)
AST: 14 U/L (ref 0–37)
Albumin: 3.8 g/dL (ref 3.5–5.2)
Alkaline Phosphatase: 84 U/L (ref 39–117)
BUN: 13 mg/dL (ref 6–23)
CO2: 27 mEq/L (ref 19–32)
Calcium: 8.9 mg/dL (ref 8.4–10.5)
Chloride: 107 mEq/L (ref 96–112)
Creatinine, Ser: 0.9 mg/dL (ref 0.40–1.20)
GFR: 72.71 mL/min (ref >60.00)
Glucose, Bld: 102 mg/dL — ABNORMAL HIGH (ref 70–99)
Potassium: 4.3 mEq/L (ref 3.5–5.1)
Sodium: 138 mEq/L (ref 135–145)
Total Bilirubin: 0.5 mg/dL (ref 0.2–1.2)
Total Protein: 7.1 g/dL (ref 6.0–8.3)

## 2019-01-10 LAB — VITAMIN B12: Vitamin B-12: 224 pg/mL (ref 211–911)

## 2019-01-10 LAB — VITAMIN D 25 HYDROXY (VIT D DEFICIENCY, FRACTURES): VITD: 33.46 ng/mL (ref 30.00–100.00)

## 2019-01-10 MED ORDER — MIRTAZAPINE 15 MG PO TABS: 15.0000 mg | ORAL_TABLET | Freq: Every day | ORAL | 0 refills | Status: DC

## 2019-01-10 NOTE — Progress Notes (Signed)
Subjective:   Patient ID: [name], female    DOB: Oct 07, 1935, 83 y.o.   MRN: [id number]  HPI Here for medicare wellness and physical, with new complaints. Please see A/P for status and treatment of chronic medical problems.   HPI #3: Here for concerns about anxiety/depression (she was taken off lorazepam about 9 months ago due to concerns about irregular fill pattern and high risk concurrent hydrocodone, she was initially given rx zoloft which she did not take, then she was given rx for buspar which she is taking, she does not think it helps, she wants it to give her motivation and drive, she has been getting intermittent rx for lorazepam from her cardiologist for some reason since we discontinued this medication).   Diet: heart healthy Physical activity: sedentary Depression/mood screen: negative Hearing: mild to moderate loss bilaterally Visual acuity: grossly normal with lens, performs annual eye exam  ADLs: capable Fall risk: none Home safety: good Cognitive evaluation: intact to orientation, naming, recall and repetition EOL planning: adv directives discussed    Office Visit from 01/10/2019 in [facility]  PHQ-2 Total Score  4        Office Visit from 01/10/2019 in [facility]  PHQ-9 Total Score  10     I have personally reviewed and have noted 1. The patient's medical and social history - reviewed today no changes 2. Their use of alcohol, tobacco or illicit drugs 3. Their current medications and supplements 4. The patient's functional ability including ADL's, fall risks, home safety risks and hearing or visual impairment. 5. Diet and physical activities 6. Evidence for depression or mood disorders 7. Care team reviewed and updated  Patient Care Team: Hoyt Koch, MD as PCP - General (Internal Medicine) Past Medical History:  Diagnosis Date  . Abnormal imaging of thyroid 04/06/2015  . Anemia   .  Anxiety   . Benign hypertension   . CAD (coronary artery disease)   . Chronic diarrhea   . Constipation   . Depressive disorder   . DJD (degenerative joint disease)   . Duodenitis   . Dysphagia 04/06/2015  . Follicular lymphoma ([place]) 10/07/2013  . Gastritis   . GERD (gastroesophageal reflux disease)   . Hyperlipidemia   . IBS (irritable bowel syndrome)   . Insomnia 02/28/2014  . Internal hemorrhoids   . Lymphadenopathy 10/22/2012  . Monoclonal gammopathy   . Sore throat 04/06/2015  . Urge incontinence   . Vitamin B12 deficiency    Past Surgical History:  Procedure Laterality Date  . ABDOMINAL HYSTERECTOMY  1992   with ovarian cystectomy  . CORONARY STENT PLACEMENT    . HEMICOLECTOMY    . ileocolonic anasomosis    . SPINE SURGERY     Family History  Problem Relation Age of Onset  . Kidney disease Mother   . Stroke Mother   . Diabetes Other        sibling  . Colon cancer Neg Hx    Review of Systems  Constitutional: Positive for fatigue.  HENT: Negative.   Eyes: Negative.   Respiratory: Negative for cough, chest tightness and shortness of breath.   Cardiovascular: Negative for chest pain, palpitations and leg swelling.  Gastrointestinal: Negative for abdominal distention, abdominal pain, constipation, diarrhea, nausea and vomiting.  Musculoskeletal: Negative.   Skin: Negative.   Neurological: Negative.   Psychiatric/Behavioral: The patient is nervous/anxious.     Objective:  Physical Exam Constitutional:  Appearance: She is well-developed.  HENT:     Head: Normocephalic and atraumatic.  Cardiovascular:     Rate and Rhythm: Normal rate and regular rhythm.  Pulmonary:     Effort: Pulmonary effort is normal. No respiratory distress.     Breath sounds: Normal breath sounds. No wheezing or rales.  Abdominal:     General: Bowel sounds are normal. There is no distension.     Palpations: Abdomen is soft.     Tenderness: There is no abdominal tenderness. There is  no rebound.  Musculoskeletal:     Cervical back: Normal range of motion.  Skin:    General: Skin is warm and dry.  Neurological:     Mental Status: She is alert and oriented to person, place, and time.     Coordination: Coordination normal.     Vitals:   01/10/19 1517  BP: 130/80  Pulse: 90  Temp: 98.6 F (37 C)  TempSrc: Oral  SpO2: 99%  Weight: 142 lb (64.4 kg)  Height: 5\' 3"  (1.6 m)   This visit occurred during the SARS-CoV-2 public health emergency.  Safety protocols were in place, including screening questions prior to the visit, additional usage of staff PPE, and extensive cleaning of exam room while observing appropriate contact time as indicated for disinfecting solutions.   Assessment & Plan:

## 2019-01-10 NOTE — Patient Instructions (Addendum)
We have sent in the remeron to take 1 pill in the evening. This can take 2-4 weeks to get in your system.   Health Maintenance, Female Adopting a healthy lifestyle and getting preventive care are important in promoting health and wellness. Ask your health care provider about:  The right schedule for you to have regular tests and exams.  Things you can do on your own to prevent diseases and keep yourself healthy. What should I know about diet, weight, and exercise? Eat a healthy diet   Eat a diet that includes plenty of vegetables, fruits, low-fat dairy products, and lean protein.  Do not eat a lot of foods that are high in solid fats, added sugars, or sodium. Maintain a healthy weight Body mass index (BMI) is used to identify weight problems. It estimates body fat based on height and weight. Your health care provider can help determine your BMI and help you achieve or maintain a healthy weight. Get regular exercise Get regular exercise. This is one of the most important things you can do for your health. Most adults should:  Exercise for at least 150 minutes each week. The exercise should increase your heart rate and make you sweat (moderate-intensity exercise).  Do strengthening exercises at least twice a week. This is in addition to the moderate-intensity exercise.  Spend less time sitting. Even light physical activity can be beneficial. Watch cholesterol and blood lipids Have your blood tested for lipids and cholesterol at 81 years of age, then have this test every 5 years. Have your cholesterol levels checked more often if:  Your lipid or cholesterol levels are high.  You are older than 81 years of age.  You are at high risk for heart disease. What should I know about cancer screening? Depending on your health history and family history, you may need to have cancer screening at various ages. This may include screening for:  Breast cancer.  Cervical cancer.  Colorectal  cancer.  Skin cancer.  Lung cancer. What should I know about heart disease, diabetes, and high blood pressure? Blood pressure and heart disease  High blood pressure causes heart disease and increases the risk of stroke. This is more likely to develop in people who have high blood pressure readings, are of African descent, or are overweight.  Have your blood pressure checked: ? Every 3-5 years if you are 75-8 years of age. ? Every year if you are 54 years old or older. Diabetes Have regular diabetes screenings. This checks your fasting blood sugar level. Have the screening done:  Once every three years after age 60 if you are at a normal weight and have a low risk for diabetes.  More often and at a younger age if you are overweight or have a high risk for diabetes. What should I know about preventing infection? Hepatitis B If you have a higher risk for hepatitis B, you should be screened for this virus. Talk with your health care provider to find out if you are at risk for hepatitis B infection. Hepatitis C Testing is recommended for:  Everyone born from [birth year] through 1963.  Anyone with known risk factors for hepatitis C. Sexually transmitted infections (STIs)  Get screened for STIs, including gonorrhea and chlamydia, if: ? You are sexually active and are younger than 81 years of age. ? You are older than 81 years of age and your health care provider tells you that you are at risk for this type of infection. ? Your  sexual activity has changed since you were last screened, and you are at increased risk for chlamydia or gonorrhea. Ask your health care provider if you are at risk.  Ask your health care provider about whether you are at high risk for HIV. Your health care provider may recommend a prescription medicine to help prevent HIV infection. If you choose to take medicine to prevent HIV, you should first get tested for HIV. You should then be tested every 3 months for as long as  you are taking the medicine. Pregnancy  If you are about to stop having your period (premenopausal) and you may become pregnant, seek counseling before you get pregnant.  Take 400 to 800 micrograms (mcg) of folic acid every day if you become pregnant.  Ask for birth control (contraception) if you want to prevent pregnancy. Osteoporosis and menopause Osteoporosis is a disease in which the bones lose minerals and strength with aging. This can result in bone fractures. If you are 52 years old or older, or if you are at risk for osteoporosis and fractures, ask your health care provider if you should:  Be screened for bone loss.  Take a calcium or vitamin D supplement to lower your risk of fractures.  Be given hormone replacement therapy (HRT) to treat symptoms of menopause. Follow these instructions at home: Lifestyle  Do not use any products that contain nicotine or tobacco, such as cigarettes, e-cigarettes, and chewing tobacco. If you need help quitting, ask your health care provider.  Do not use street drugs.  Do not share needles.  Ask your health care provider for help if you need support or information about quitting drugs. Alcohol use  Do not drink alcohol if: ? Your health care provider tells you not to drink. ? You are pregnant, may be pregnant, or are planning to become pregnant.  If you drink alcohol: ? Limit how much you use to 0-1 drink a day. ? Limit intake if you are breastfeeding.  Be aware of how much alcohol is in your drink. In the [place] one drink equals one 12 oz bottle of beer (355 mL), one 5 oz glass of wine (148 mL), or one 1 oz glass of hard liquor (44 mL). General instructions  Schedule regular health, dental, and eye exams.  Stay current with your vaccines.  Tell your health care provider if: ? You often feel depressed. ? You have ever been abused or do not feel safe at home. Summary  Adopting a healthy lifestyle and getting preventive care are  important in promoting health and wellness.  Follow your health care provider's instructions about healthy diet, exercising, and getting tested or screened for diseases.  Follow your health care provider's instructions on monitoring your cholesterol and blood pressure. This information is not intended to replace advice given to you by your health care provider. Make sure you discuss any questions you have with your health care provider. Document Released: 07/26/2010 Document Revised: 01/03/2018 Document Reviewed: 01/03/2018 Elsevier Patient Education  2020 [employer].

## 2019-01-11 NOTE — Assessment & Plan Note (Signed)
Flu shot up to date. Pneumonia complete. Shingrix counseled. Tetanus declines. Colonoscopy aged out. Mammogram aged out, pap smear aged out and dexa declines. Counseled about sun safety and mole surveillance. Counseled about the dangers of distracted driving. Given 10 year screening recommendations.

## 2019-01-11 NOTE — Assessment & Plan Note (Signed)
Likely medication related from the hydrocodone.

## 2019-01-11 NOTE — Assessment & Plan Note (Signed)
Advised to quit and she does not feel able to make an attempt at this time.  

## 2019-01-11 NOTE — Assessment & Plan Note (Signed)
It is again unclear to me that she has true anxiety. She is unable to describe any anxiety symptoms. Sounds like she was taking lorazepam for energy. Digresses a lot when trying to ask about current symptoms to 1980s when she was started on this. She has high risk due to irregular fill history, not being able to account for pills, concurrent hydrocodone so I will not prescribe benzos for her. She can continue buspar if helping. Rx remeron to help with sleep and motivation. Checking B12 and vitamin D to see if these are contributing to her feeling of lack of energy. Reminded her several times that lack of energy is not anxiety. Offered counseling to see if we can get to the root of her problems and find solutions but she was not interested.

## 2019-01-11 NOTE — Assessment & Plan Note (Signed)
BP at goal on no meds currently. Monitor and checking CMP.

## 2019-01-11 NOTE — Assessment & Plan Note (Signed)
Not checked in some time, checking B12 level. She does have low energy levels.

## 2019-02-04 ENCOUNTER — Ambulatory Visit: Payer: Medicare HMO

## 2019-02-28 ENCOUNTER — Other Ambulatory Visit: Payer: Self-pay | Admitting: Internal Medicine

## 2019-03-10 ENCOUNTER — Ambulatory Visit: Payer: Medicare HMO

## 2019-03-10 ENCOUNTER — Other Ambulatory Visit: Payer: Self-pay | Admitting: Internal Medicine

## 2019-03-21 DIAGNOSIS — I10 Essential (primary) hypertension: Secondary | ICD-10-CM | POA: Diagnosis not present

## 2019-03-21 DIAGNOSIS — M545 Low back pain: Secondary | ICD-10-CM | POA: Diagnosis not present

## 2019-03-21 DIAGNOSIS — I2510 Atherosclerotic heart disease of native coronary artery without angina pectoris: Secondary | ICD-10-CM | POA: Diagnosis not present

## 2019-03-21 DIAGNOSIS — R072 Precordial pain: Secondary | ICD-10-CM | POA: Diagnosis not present

## 2019-03-27 DIAGNOSIS — I10 Essential (primary) hypertension: Secondary | ICD-10-CM | POA: Diagnosis not present

## 2019-03-27 DIAGNOSIS — K219 Gastro-esophageal reflux disease without esophagitis: Secondary | ICD-10-CM | POA: Diagnosis not present

## 2019-03-27 DIAGNOSIS — G8929 Other chronic pain: Secondary | ICD-10-CM | POA: Diagnosis not present

## 2019-03-27 DIAGNOSIS — M1990 Unspecified osteoarthritis, unspecified site: Secondary | ICD-10-CM | POA: Diagnosis not present

## 2019-03-27 DIAGNOSIS — C8590 Non-Hodgkin lymphoma, unspecified, unspecified site: Secondary | ICD-10-CM | POA: Diagnosis not present

## 2019-03-27 DIAGNOSIS — N3281 Overactive bladder: Secondary | ICD-10-CM | POA: Diagnosis not present

## 2019-03-27 DIAGNOSIS — I2510 Atherosclerotic heart disease of native coronary artery without angina pectoris: Secondary | ICD-10-CM | POA: Diagnosis not present

## 2019-03-27 DIAGNOSIS — R69 Illness, unspecified: Secondary | ICD-10-CM | POA: Diagnosis not present

## 2019-04-07 ENCOUNTER — Other Ambulatory Visit: Payer: Self-pay | Admitting: Internal Medicine

## 2019-06-03 ENCOUNTER — Other Ambulatory Visit: Payer: Self-pay | Admitting: Internal Medicine

## 2019-06-19 DIAGNOSIS — I2510 Atherosclerotic heart disease of native coronary artery without angina pectoris: Secondary | ICD-10-CM | POA: Diagnosis not present

## 2019-06-19 DIAGNOSIS — I10 Essential (primary) hypertension: Secondary | ICD-10-CM | POA: Diagnosis not present

## 2019-06-19 DIAGNOSIS — M545 Low back pain: Secondary | ICD-10-CM | POA: Diagnosis not present

## 2019-06-19 DIAGNOSIS — R072 Precordial pain: Secondary | ICD-10-CM | POA: Diagnosis not present

## 2019-07-05 ENCOUNTER — Other Ambulatory Visit: Payer: Self-pay | Admitting: Internal Medicine

## 2019-07-18 DIAGNOSIS — Z79899 Other long term (current) drug therapy: Secondary | ICD-10-CM | POA: Diagnosis not present

## 2019-07-18 DIAGNOSIS — Z5181 Encounter for therapeutic drug level monitoring: Secondary | ICD-10-CM | POA: Diagnosis not present

## 2019-07-18 DIAGNOSIS — G894 Chronic pain syndrome: Secondary | ICD-10-CM | POA: Diagnosis not present

## 2019-07-18 DIAGNOSIS — M48061 Spinal stenosis, lumbar region without neurogenic claudication: Secondary | ICD-10-CM | POA: Diagnosis not present

## 2019-09-25 DIAGNOSIS — I2510 Atherosclerotic heart disease of native coronary artery without angina pectoris: Secondary | ICD-10-CM | POA: Diagnosis not present

## 2019-09-25 DIAGNOSIS — I10 Essential (primary) hypertension: Secondary | ICD-10-CM | POA: Diagnosis not present

## 2019-09-25 DIAGNOSIS — R072 Precordial pain: Secondary | ICD-10-CM | POA: Diagnosis not present

## 2019-09-25 DIAGNOSIS — G43119 Migraine with aura, intractable, without status migrainosus: Secondary | ICD-10-CM | POA: Diagnosis not present

## 2019-10-03 ENCOUNTER — Other Ambulatory Visit: Payer: Self-pay | Admitting: Internal Medicine

## 2019-10-07 ENCOUNTER — Telehealth: Payer: Self-pay | Admitting: Internal Medicine

## 2019-10-07 NOTE — Progress Notes (Signed)
  Chronic Care Management   Note  10/07/2019 Name: [name] MRN: [id number] DOB: 1935-06-01  [name] is a 84 y.o. year old female who is a primary care patient of Hoyt Koch, MD. I reached out to [name] by phone today in response to a referral sent by Ms. Ettamae L Seely's PCP, Hoyt Koch, MD.   Ms. Steinmeyer was given information about Chronic Care Management services today including:  1. CCM service includes personalized support from designated clinical staff supervised by her physician, including individualized plan of care and coordination with other care providers 2. 24/7 contact phone numbers for assistance for urgent and routine care needs. 3. Service will only be billed when office clinical staff spend 20 minutes or more in a month to coordinate care. 4. Only one practitioner may furnish and bill the service in a calendar month. 5. The patient may stop CCM services at any time (effective at the end of the month) by phone call to the office staff.   Patient agreed to services and verbal consent obtained.   Follow up plan:   Carley Perdue [facility] Scheduler

## 2019-11-12 DIAGNOSIS — R531 Weakness: Secondary | ICD-10-CM | POA: Diagnosis not present

## 2019-11-12 DIAGNOSIS — R072 Precordial pain: Secondary | ICD-10-CM | POA: Diagnosis not present

## 2019-11-12 DIAGNOSIS — I2510 Atherosclerotic heart disease of native coronary artery without angina pectoris: Secondary | ICD-10-CM | POA: Diagnosis not present

## 2019-11-12 DIAGNOSIS — I10 Essential (primary) hypertension: Secondary | ICD-10-CM | POA: Diagnosis not present

## 2019-11-13 ENCOUNTER — Other Ambulatory Visit: Payer: Self-pay | Admitting: Internal Medicine

## 2019-11-18 ENCOUNTER — Ambulatory Visit: Payer: Medicare HMO

## 2019-11-18 NOTE — Chronic Care Management (AMB) (Deleted)
Chronic Care Management Pharmacy  Name: [name]  MRN: [id number] DOB: Mar 29, 1935   Chief Complaint/ HPI  [name],  84 y.o. , female presents for their Initial CCM visit with the clinical pharmacist In office.  PCP : Hoyt Koch, MD Patient Care Team: Hoyt Koch, MD as PCP - General (Internal Medicine) Charlton Haws, [facility] as Pharmacist (Pharmacist)  Their chronic conditions include: Hypertension, Anxiety, Tobacco use and Overactive Bladder, back pain, IBS, Migraine  Office Visits: ***  Consult Visit: ***  No Known Allergies  Medications: Outpatient Encounter Medications as of 11/18/2019  Medication Sig   aspirin 81 MG tablet Take 81 mg by mouth daily.     busPIRone (BUSPAR) 5 MG tablet TAKE 1 TABLET(5 MG) BY MOUTH TWICE DAILY   clopidogrel (PLAVIX) 75 MG tablet clopidogrel 75 mg tablet  TK 1 T PO QD   diclofenac sodium (VOLTAREN) 1 % GEL Voltaren 1 % topical gel  APPLY 2 GRAM TO THE AFFECTED AREA(S) BY TOPICAL ROUTE 4 TIMES PER DAY   famotidine (PEPCID) 20 MG tablet famotidine 20 mg tablet   HYDROcodone-acetaminophen (NORCO) 10-325 MG tablet Take 1 tablet by mouth 2 (two) times daily as needed for pain.   mirtazapine (REMERON) 15 MG tablet Take 1 tablet (15 mg total) by mouth at bedtime. Annual appt due in Dec must see provider for future refills   nitroGLYCERIN (NITROLINGUAL) 0.4 MG/SPRAY spray Place 1 spray under the tongue every 5 (five) minutes x 3 doses as needed for chest pain.   oxybutynin (DITROPAN) 5 MG tablet Take 5 mg by mouth daily.   No facility-administered encounter medications on file as of 11/18/2019.    Wt Readings from Last 3 Encounters:  01/10/19 142 lb (64.4 kg)  09/28/18 143 lb 3.2 oz (65 kg)  02/13/18 138 lb (62.6 kg)    Current Diagnosis/Assessment:    Goals Addressed   None     Hypertension   BP goal is:  {CHL HP UPSTREAM Pharmacist BP ranges:[phone]}  Office blood pressures  are  BP Readings from Last 3 Encounters:  01/10/19 130/80  09/28/18 (!) 144/65  02/13/18 130/60   Patient checks BP at home {CHL HP BP Monitoring [phone]} Patient home BP readings are ranging: ***  Patient has failed these meds in the past: *** Patient is currently {CHL Controlled/Uncontrolled:[phone]} on the following medications:   Metoprolol succinate 25 mg daily (Dr Doylene Canard)  We discussed {CHL HP Upstream Pharmacy discussion:[phone]}  Plan  Continue {CHL HP Upstream Pharmacy EKCMK:[id number]}   Hyperlipidemia / CAD   LDL goal < 70 CAD w/ stent placement 2012 Statin stopped 2016.  Lipid Panel     Component Value Date/Time   CHOL 185 01/10/2019 1559   TRIG 99.0 01/10/2019 1559   HDL 41.90 01/10/2019 1559   LDLCALC 123 (H) 01/10/2019 1559    Hepatic Function Latest Ref Rng & Units 01/10/2019 09/14/2017 05/19/2017  Total Protein 6.0 - 8.3 g/dL 7.1 7.3 5.9(L)  Albumin 3.5 - 5.2 g/dL 3.8 3.7 3.1(L)  AST 0 - 37 U/L '14 18 17  ' ALT 0 - 35 U/L '10 18 17  ' Alk Phosphatase 39 - 117 U/L 84 123 71  Total Bilirubin 0.2 - 1.2 mg/dL 0.5 0.5 0.7     The ASCVD Risk score ([place]., et al., 2013) failed to calculate for the following reasons:   The 2013 ASCVD risk score is only valid for ages 33 to 49   Patient has failed  these meds in past: atorvastatin, cholestyramine, simvastatin Patient is currently {CHL Controlled/Uncontrolled:[phone]} on the following medications:   Nitroglycerin 0.4 mg spray PRN  Clopidogrel 75 mg daily  Aspirin 81 mg daily  We discussed:  {CHL HP Upstream Pharmacy discussion:[phone]}  Plan  Continue {CHL HP Upstream Pharmacy Plans:[phone]}  Depression / Anxiety   Depression screen [facility] 2/9 01/10/2019 05/26/2016 11/25/2014  Decreased Interest 2 0 0  Down, Depressed, Hopeless 2 0 0  PHQ - 2 Score 4 0 0  Altered sleeping 3 0 -  Tired, decreased energy 2 2 -  Change in appetite 1 0 -  Feeling bad or failure about  yourself  0 0 -  Trouble concentrating 0 0 -  Moving slowly or fidgety/restless 0 0 -  Suicidal thoughts 0 0 -  PHQ-9 Score 10 2 -   GAD 7 : Generalized Anxiety Score 01/10/2019 05/26/2016  Nervous, Anxious, on Edge 1 1  Control/stop worrying 0 0  Worry too much - different things 1 2  Trouble relaxing 3 2  Restless 0 0  Easily annoyed or irritable 0 0  Afraid - awful might happen 0 0  Total GAD 7 Score 5 5    Patient has failed these meds in past: *** Patient is currently {CHL Controlled/Uncontrolled:[phone]} on the following medications:   Mirtazapine 15 mg HS  Buspirone 5 mg BID  Quetiapine 25 mg HS (Dr Doylene Canard)  We discussed:  ***  Plan  Continue {CHL HP Upstream Pharmacy BVQXI:[id number]}  GERD   Patient has failed these meds in past: *** Patient is currently {CHL Controlled/Uncontrolled:[phone]} on the following medications:   Famotidine 20 mg   We discussed:  ***  Plan  Continue {CHL HP Upstream Pharmacy Plans:[phone]}  Overactive bladder   Patient has failed these meds in past: *** Patient is currently {CHL Controlled/Uncontrolled:[phone]} on the following medications:   Oxybutynin 5 mg daily  We discussed:  ***  Plan  Continue {CHL HP Upstream Pharmacy Plans:[phone]}   Back pain   Pain mgmt Dr Bayard Males  Patient has failed these meds in past: *** Patient is currently {CHL Controlled/Uncontrolled:[phone]} on the following medications:   Voltaren 1% gel  Hydrocodone-APAP 10-325 mg (#120/month)  We discussed:  ***  Plan  Continue {CHL HP Upstream Pharmacy Plans:[phone]}  Migraine   Patient has failed these meds in past: *** Patient is currently {CHL Controlled/Uncontrolled:[phone]} on the following medications:   Butalbital-APAP-caffeine PRN  We discussed:  ***  Plan  Continue {CHL HP Upstream Pharmacy Plans:[phone]}  Medication Management   Pt uses [facility] for all medications Uses pill  box? {Yes or If no, why not?:20788} Pt endorses ***% compliance  We discussed: {Pharmacy options:24294}  Plan  {US Pharmacy LKJZ:79150}    Follow up: *** month phone visit  ***

## 2019-11-28 ENCOUNTER — Ambulatory Visit: Payer: Medicare HMO

## 2019-11-28 NOTE — Chronic Care Management (AMB) (Deleted)
Chronic Care Management Pharmacy  Name: [name]  MRN: [id number] DOB: [birth year]   Chief Complaint/ HPI  [name],  84 y.o. , female presents for their Initial CCM visit with the clinical pharmacist In office.  PCP : Hoyt Koch, MD Patient Care Team: Hoyt Koch, MD as PCP - General (Internal Medicine) Charlton Haws, [facility] as Pharmacist (Pharmacist)  Their chronic conditions include: Hypertension, Anxiety, Tobacco use and Overactive Bladder, back pain, IBS, Migraine, Hx CAD  Office Visits: 01/10/2019 Dr Sharlet Salina OV: f/u for anxiety/depression. Pt taken off lorazepam 9 mos prior due to high risk med w/ hydrocodone. Pt has been getting intermittent lorazepam rx from cardiology. Unclear if she has true anxiety (pt's main complaint is low energy) but has been on benzos since 1980s. Rx mirtazapine for sleep/motivation. Pt declined counseling  Consult Visit: 07/18/19 Dr Bayard Males (chiropractic medicine): f/u for spinal stenosis, chronic pain  03/21/19 Dr Doylene Canard (cardiolog): f/u for HTN, CAD  No Known Allergies  Medications: Outpatient Encounter Medications as of 11/28/2019  Medication Sig  . aspirin 81 MG tablet Take 81 mg by mouth daily.    . busPIRone (BUSPAR) 5 MG tablet TAKE 1 TABLET(5 MG) BY MOUTH TWICE DAILY  . clopidogrel (PLAVIX) 75 MG tablet clopidogrel 75 mg tablet  TK 1 T PO QD  . diclofenac sodium (VOLTAREN) 1 % GEL Voltaren 1 % topical gel  APPLY 2 GRAM TO THE AFFECTED AREA(S) BY TOPICAL ROUTE 4 TIMES PER DAY  . famotidine (PEPCID) 20 MG tablet famotidine 20 mg tablet  . HYDROcodone-acetaminophen (NORCO) 10-325 MG tablet Take 1 tablet by mouth 2 (two) times daily as needed for pain.  . mirtazapine (REMERON) 15 MG tablet Take 1 tablet (15 mg total) by mouth at bedtime. Annual appt due in Dec must see provider for future refills  . nitroGLYCERIN (NITROLINGUAL) 0.4 MG/SPRAY spray Place 1 spray under the tongue every 5 (five) minutes x 3  doses as needed for chest pain.  [name] oxybutynin (DITROPAN) 5 MG tablet Take 5 mg by mouth daily.   No facility-administered encounter medications on file as of 11/28/2019.    Wt Readings from Last 3 Encounters:  01/10/19 142 lb (64.4 kg)  09/28/18 143 lb 3.2 oz (65 kg)  02/13/18 138 lb (62.6 kg)    Current Diagnosis/Assessment:    Goals Addressed   None     Hypertension   BP goal is:  <130/80  Office blood pressures are  BP Readings from Last 3 Encounters:  01/10/19 130/80  09/28/18 (!) 144/65  02/13/18 130/60   Patient checks BP at home {CHL HP BP Monitoring Frequency:[phone]} Patient home BP readings are ranging: ***  Patient has failed these meds in the past: *** Patient is currently {CHL Controlled/Uncontrolled:[phone]} on the following medications:  [name] Metoprolol succinate 25 mg daily (Dr Doylene Canard)  We discussed {CHL HP Upstream Pharmacy [phone]}  Plan  Continue {CHL HP Upstream Pharmacy [phone]}   Hyperlipidemia / CAD   LDL goal < 70 CAD w/ stent placement 2012 Statin stopped 2016.  Lipid Panel     Component Value Date/Time   CHOL 185 01/10/2019 1559   TRIG 99.0 01/10/2019 1559   HDL 41.90 01/10/2019 1559   LDLCALC 123 (H) 01/10/2019 1559    Hepatic Function Latest Ref Rng & Units 01/10/2019 09/14/2017 05/19/2017  Total Protein 6.0 - 8.3 g/dL 7.1 7.3 5.9(L)  Albumin 3.5 - 5.2 g/dL 3.8 3.7 3.1(L)  AST 0 - 37 U/L 14 18  17  ALT 0 - 35 U/L '10 18 17  ' Alk Phosphatase 39 - 117 U/L 84 123 71  Total Bilirubin 0.2 - 1.2 mg/dL 0.5 0.5 0.7     The ASCVD Risk score ([place]., et al., 2013) failed to calculate for the following reasons:   The 2013 ASCVD risk score is only valid for ages 29 to 7   Patient has failed these meds in past: atorvastatin, cholestyramine, simvastatin Patient is currently {CHL Controlled/Uncontrolled:[phone]} on the following medications:  . Nitroglycerin 0.4 mg spray PRN . Clopidogrel 75 mg  daily . Aspirin 81 mg daily  We discussed:  {CHL HP Upstream Pharmacy discussion:[phone]}  Plan  Continue {CHL HP Upstream Pharmacy EVQWQ:[id number]}  Depression / Anxiety   Depression screen [facility] 2/9 01/10/2019 05/26/2016 11/25/2014  Decreased Interest 2 0 0  Down, Depressed, Hopeless 2 0 0  PHQ - 2 Score 4 0 0  Altered sleeping 3 0 -  Tired, decreased energy 2 2 -  Change in appetite 1 0 -  Feeling bad or failure about yourself  0 0 -  Trouble concentrating 0 0 -  Moving slowly or fidgety/restless 0 0 -  Suicidal thoughts 0 0 -  PHQ-9 Score 10 2 -   GAD 7 : Generalized Anxiety Score 01/10/2019 05/26/2016  Nervous, Anxious, on Edge 1 1  Control/stop worrying 0 0  Worry too much - different things 1 2  Trouble relaxing 3 2  Restless 0 0  Easily annoyed or irritable 0 0  Afraid - awful might happen 0 0  Total GAD 7 Score 5 5   Patient has failed these meds in past: lorazepam Patient is currently {CHL Controlled/Uncontrolled:[phone]} on the following medications:  [name] Mirtazapine 15 mg HS . Buspirone 5 mg BID . Quetiapine 25 mg HS (Dr Doylene Canard)  We discussed:  ***  Plan  Continue {CHL HP Upstream Pharmacy VQQUI:[id number]}  Tobacco Abuse   Tobacco Status:  Social History   Tobacco Use  Smoking Status Current Every Day Smoker  . Packs/day: 0.50  . Years: 50.00  . Pack years: 25.00  . Types: Cigarettes  Smokeless Tobacco Never Used   Patient smokes {Time to first cigarette:23873} Patient triggers include: {Smoking Triggers:23882} On a scale of 1-10, reports MOTIVATION to quit is *** On a scale of 1-10, reports CONFIDENCE in quitting is ***  Previous quit attempts included: *** Patient is currently {CHL Controlled/Uncontrolled:[phone]} on the following medications:  . ***  We discussed:  {Smoking Cessation Counseling:23883}  Plan  Continue {CHL HP Upstream Pharmacy [phone]}  GERD   Patient has failed these meds in past: *** Patient is  currently {CHL Controlled/Uncontrolled:[phone]} on the following medications:  . Famotidine 20 mg   We discussed:  ***  Plan  Continue {CHL HP Upstream Pharmacy [phone]}  Overactive bladder   Patient has failed these meds in past: *** Patient is currently {CHL Controlled/Uncontrolled:[phone]} on the following medications:  . Oxybutynin 5 mg daily  We discussed:  ***  Plan  Continue {CHL HP Upstream Pharmacy [phone]}   Chronic pain   Pain mgmt Dr Bayard Males Lumbar radiculopathy  Patient has failed these meds in past: *** Patient is currently {CHL Controlled/Uncontrolled:[phone]} on the following medications:  [name] Voltaren 1% gel . Hydrocodone-APAP 10-325 mg (#120/month)  We discussed:  ***  Plan  Continue {CHL HP Upstream Pharmacy [phone]}  Migraine   Patient has failed these meds in past: *** Patient is currently {CHL Controlled/Uncontrolled:[phone]} on the following medications:  .  Butalbital-APAP-caffeine PRN (Dr Doylene Canard)  We discussed:  ***  Plan  Continue {CHL HP Upstream Pharmacy KQASU:[id number]}  Health Maintenance   Lab Results  Component Value Date/Time   [id number] 224 01/10/2019 03:59 PM   Patient is currently {CHL Controlled/Uncontrolled:[phone]} on the following medications:  . ***  We discussed:  ***  Plan  Continue {CHL HP Upstream Pharmacy HVFMB:[id number]}  Medication Management   Pt uses [facility] for all medications Uses pill box? {Yes or If no, why not?:20788} Pt endorses ***% compliance  We discussed: {Pharmacy options:24294}  Plan  {US Pharmacy CVKF:84037}    Follow up: *** month phone visit  ***

## 2019-12-12 ENCOUNTER — Ambulatory Visit: Payer: Medicare HMO | Admitting: Internal Medicine

## 2019-12-24 ENCOUNTER — Ambulatory Visit: Payer: Medicare HMO | Admitting: Internal Medicine

## 2019-12-30 DIAGNOSIS — I425 Other restrictive cardiomyopathy: Secondary | ICD-10-CM | POA: Diagnosis not present

## 2019-12-30 DIAGNOSIS — I2510 Atherosclerotic heart disease of native coronary artery without angina pectoris: Secondary | ICD-10-CM | POA: Diagnosis not present

## 2019-12-30 DIAGNOSIS — R072 Precordial pain: Secondary | ICD-10-CM | POA: Diagnosis not present

## 2020-01-13 ENCOUNTER — Encounter: Payer: Medicare HMO | Admitting: Internal Medicine

## 2020-01-13 ENCOUNTER — Encounter: Payer: Self-pay | Admitting: Internal Medicine

## 2020-01-13 ENCOUNTER — Ambulatory Visit (INDEPENDENT_AMBULATORY_CARE_PROVIDER_SITE_OTHER): Payer: Medicare HMO | Admitting: Internal Medicine

## 2020-01-13 ENCOUNTER — Other Ambulatory Visit: Payer: Self-pay

## 2020-01-13 VITALS — BP 156/80 | HR 70 | Temp 97.9°F | Ht 61.0 in | Wt 137.6 lb

## 2020-01-13 DIAGNOSIS — R69 Illness, unspecified: Secondary | ICD-10-CM | POA: Diagnosis not present

## 2020-01-13 DIAGNOSIS — I10 Essential (primary) hypertension: Secondary | ICD-10-CM

## 2020-01-13 DIAGNOSIS — Z23 Encounter for immunization: Secondary | ICD-10-CM | POA: Diagnosis not present

## 2020-01-13 DIAGNOSIS — Z0000 Encounter for general adult medical examination without abnormal findings: Secondary | ICD-10-CM | POA: Diagnosis not present

## 2020-01-13 DIAGNOSIS — F17200 Nicotine dependence, unspecified, uncomplicated: Secondary | ICD-10-CM

## 2020-01-13 DIAGNOSIS — R739 Hyperglycemia, unspecified: Secondary | ICD-10-CM

## 2020-01-13 DIAGNOSIS — G894 Chronic pain syndrome: Secondary | ICD-10-CM

## 2020-01-13 DIAGNOSIS — F411 Generalized anxiety disorder: Secondary | ICD-10-CM

## 2020-01-13 NOTE — Patient Instructions (Addendum)
You can take miralax for the constipation if needed while taking the pain medicine.  We will check the medicines to make sure what we give you does not have any interactions.   For the covid-19 to get another card you can go on the internet or have someone go on for you [email or web address]  Health Maintenance, Female Adopting a healthy lifestyle and getting preventive care are important in promoting health and wellness. Ask your health care provider about:  The right schedule for you to have regular tests and exams.  Things you can do on your own to prevent diseases and keep yourself healthy. What should I know about diet, weight, and exercise? Eat a healthy diet   Eat a diet that includes plenty of vegetables, fruits, low-fat dairy products, and lean protein.  Do not eat a lot of foods that are high in solid fats, added sugars, or sodium. Maintain a healthy weight Body mass index (BMI) is used to identify weight problems. It estimates body fat based on height and weight. Your health care provider can help determine your BMI and help you achieve or maintain a healthy weight. Get regular exercise Get regular exercise. This is one of the most important things you can do for your health. Most adults should:  Exercise for at least 150 minutes each week. The exercise should increase your heart rate and make you sweat (moderate-intensity exercise).  Do strengthening exercises at least twice a week. This is in addition to the moderate-intensity exercise.  Spend less time sitting. Even light physical activity can be beneficial. Watch cholesterol and blood lipids Have your blood tested for lipids and cholesterol at 82 years of age, then have this test every 5 years. Have your cholesterol levels checked more often if:  Your lipid or cholesterol levels are high.  You are older than 82 years of age.  You are at high risk for heart disease. What should I know about cancer  screening? Depending on your health history and family history, you may need to have cancer screening at various ages. This may include screening for:  Breast cancer.  Cervical cancer.  Colorectal cancer.  Skin cancer.  Lung cancer. What should I know about heart disease, diabetes, and high blood pressure? Blood pressure and heart disease  High blood pressure causes heart disease and increases the risk of stroke. This is more likely to develop in people who have high blood pressure readings, are of African descent, or are overweight.  Have your blood pressure checked: ? Every 3-5 years if you are 14-68 years of age. ? Every year if you are 55 years old or older. Diabetes Have regular diabetes screenings. This checks your fasting blood sugar level. Have the screening done:  Once every three years after age 31 if you are at a normal weight and have a low risk for diabetes.  More often and at a younger age if you are overweight or have a high risk for diabetes. What should I know about preventing infection? Hepatitis B If you have a higher risk for hepatitis B, you should be screened for this virus. Talk with your health care provider to find out if you are at risk for hepatitis B infection. Hepatitis C Testing is recommended for:  Everyone born from [birth year] through 1963.  Anyone with known risk factors for hepatitis C. Sexually transmitted infections (STIs)  Get screened for STIs, including gonorrhea and chlamydia, if: ? You are sexually active and are younger than 82  years of age. ? You are older than 82 years of age and your health care provider tells you that you are at risk for this type of infection. ? Your sexual activity has changed since you were last screened, and you are at increased risk for chlamydia or gonorrhea. Ask your health care provider if you are at risk.  Ask your health care provider about whether you are at high risk for HIV. Your health care provider may  recommend a prescription medicine to help prevent HIV infection. If you choose to take medicine to prevent HIV, you should first get tested for HIV. You should then be tested every 3 months for as long as you are taking the medicine. Pregnancy  If you are about to stop having your period (premenopausal) and you may become pregnant, seek counseling before you get pregnant.  Take 400 to 800 micrograms (mcg) of folic acid every day if you become pregnant.  Ask for birth control (contraception) if you want to prevent pregnancy. Osteoporosis and menopause Osteoporosis is a disease in which the bones lose minerals and strength with aging. This can result in bone fractures. If you are 95 years old or older, or if you are at risk for osteoporosis and fractures, ask your health care provider if you should:  Be screened for bone loss.  Take a calcium or vitamin D supplement to lower your risk of fractures.  Be given hormone replacement therapy (HRT) to treat symptoms of menopause. Follow these instructions at home: Lifestyle  Do not use any products that contain nicotine or tobacco, such as cigarettes, e-cigarettes, and chewing tobacco. If you need help quitting, ask your health care provider.  Do not use street drugs.  Do not share needles.  Ask your health care provider for help if you need support or information about quitting drugs. Alcohol use  Do not drink alcohol if: ? Your health care provider tells you not to drink. ? You are pregnant, may be pregnant, or are planning to become pregnant.  If you drink alcohol: ? Limit how much you use to 0-1 drink a day. ? Limit intake if you are breastfeeding.  Be aware of how much alcohol is in your drink. In the [place] one drink equals one 12 oz bottle of beer (355 mL), one 5 oz glass of wine (148 mL), or one 1 oz glass of hard liquor (44 mL). General instructions  Schedule regular health, dental, and eye exams.  Stay current with your  vaccines.  Tell your health care provider if: ? You often feel depressed. ? You have ever been abused or do not feel safe at home. Summary  Adopting a healthy lifestyle and getting preventive care are important in promoting health and wellness.  Follow your health care provider's instructions about healthy diet, exercising, and getting tested or screened for diseases.  Follow your health care provider's instructions on monitoring your cholesterol and blood pressure. This information is not intended to replace advice given to you by your health care provider. Make sure you discuss any questions you have with your health care provider. Document Revised: 01/03/2018 Document Reviewed: 01/03/2018 Elsevier Patient Education  2020 [employer].

## 2020-01-13 NOTE — Progress Notes (Signed)
Subjective:   Patient ID: [name], female    DOB: 01/09/1936, 84 y.o.   MRN: [id number]  HPI  Here for medicare wellness and physical, no new complaints. Please see A/P for status and treatment of chronic medical problems.   Diet: heart healthy Physical activity: sedentary Depression/mood screen: negative Hearing: intact to whispered voice Visual acuity: grossly normal with lens, performs annual eye exam  ADLs: capable Fall risk: none Home safety: good Cognitive evaluation: intact to orientation, naming, recall and repetition EOL planning: adv directives discussed  [place] Visit from 01/13/2020 in [place] at [facility]  PHQ-2 Total Score 1      [place] Visit from 01/10/2019 in [place]  PHQ-9 Total Score 10     I have personally reviewed and have noted 1. The patient's medical and social history - reviewed today no changes 2. Their use of alcohol, tobacco or illicit drugs 3. Their current medications and supplements 4. The patient's functional ability including ADL's, fall risks, home safety risks and hearing or visual impairment. 5. Diet and physical activities 6. Evidence for depression or mood disorders 7. Care team reviewed and updated  Patient Care Team: Hoyt Koch, MD as PCP - General (Internal Medicine) Charlton Haws, [facility] as Pharmacist (Pharmacist) Past Medical History:  Diagnosis Date  . Abnormal imaging of thyroid 04/06/2015  . Anemia   . Anxiety   . Benign hypertension   . CAD (coronary artery disease)   . Chronic diarrhea   . Constipation   . Depressive disorder   . DJD (degenerative joint disease)   . Duodenitis   . Dysphagia 04/06/2015  . Follicular lymphoma ([place]) 10/07/2013  . Gastritis   . GERD (gastroesophageal reflux disease)   . Hyperlipidemia   . IBS (irritable bowel syndrome)   . Insomnia 02/28/2014  . Internal hemorrhoids   . Lymphadenopathy  10/22/2012  . Monoclonal gammopathy   . Sore throat 04/06/2015  . Urge incontinence   . Vitamin B12 deficiency    Past Surgical History:  Procedure Laterality Date  . ABDOMINAL HYSTERECTOMY  1992   with ovarian cystectomy  . CORONARY STENT PLACEMENT    . EYE SURGERY     cataract  . HEMICOLECTOMY    . ileocolonic anasomosis    . SPINE SURGERY     Family History  Problem Relation Age of Onset  . Kidney disease Mother   . Stroke Mother   . Diabetes Other        sibling  . Colon cancer Neg Hx     Review of Systems  Constitutional: Negative.   HENT: Negative.   Eyes: Negative.   Respiratory: Negative for cough, chest tightness and shortness of breath.   Cardiovascular: Negative for chest pain, palpitations and leg swelling.  Gastrointestinal: Negative for abdominal distention, abdominal pain, constipation, diarrhea, nausea and vomiting.  Musculoskeletal: Negative.   Skin: Negative.   Neurological: Negative.   Psychiatric/Behavioral: Negative.     Objective:  Physical Exam Constitutional:      Appearance: She is well-developed and well-nourished.  HENT:     Head: Normocephalic and atraumatic.  Eyes:     Extraocular Movements: EOM normal.  Cardiovascular:     Rate and Rhythm: Normal rate and regular rhythm.  Pulmonary:     Effort: Pulmonary effort is normal. No respiratory distress.     Breath sounds: Normal breath sounds. No wheezing or rales.  Abdominal:  General: Bowel sounds are normal. There is no distension.     Palpations: Abdomen is soft.     Tenderness: There is no abdominal tenderness. There is no rebound.  Musculoskeletal:        General: No edema.     Cervical back: Normal range of motion.  Skin:    General: Skin is warm and dry.  Neurological:     Mental Status: She is alert and oriented to person, place, and time.     Coordination: Coordination normal.  Psychiatric:        Mood and Affect: Mood and affect normal.     Vitals:   01/13/20  1541 01/13/20 1616  BP: (!) 160/78 (!) 156/80  Pulse: 70   Temp: 97.9 F (36.6 C)   TempSrc: Oral   SpO2: 96%   Weight: 137 lb 9.6 oz (62.4 kg)   Height: 5\' 1"  (1.549 m)    This visit occurred during the SARS-CoV-2 public health emergency.  Safety protocols were in place, including screening questions prior to the visit, additional usage of staff PPE, and extensive cleaning of exam room while observing appropriate contact time as indicated for disinfecting solutions.   Assessment & Plan:  Flu shot given at visit

## 2020-01-14 LAB — LIPID PANEL
Cholesterol: 168 mg/dL (ref 0–200)
HDL: 38.7 mg/dL — ABNORMAL LOW (ref >39.00)
LDL Cholesterol: 103 mg/dL — ABNORMAL HIGH (ref 0–99)
NonHDL: 129.2
Total CHOL/HDL Ratio: 4
Triglycerides: 131 mg/dL (ref 0.0–149.0)
VLDL: 26.2 mg/dL (ref 0.0–40.0)

## 2020-01-14 LAB — COMPREHENSIVE METABOLIC PANEL
ALT: 10 U/L (ref 0–35)
AST: 16 U/L (ref 0–37)
Albumin: 4 g/dL (ref 3.5–5.2)
Alkaline Phosphatase: 90 U/L (ref 39–117)
BUN: 12 mg/dL (ref 6–23)
CO2: 30 mEq/L (ref 19–32)
Calcium: 9 mg/dL (ref 8.4–10.5)
Chloride: 107 mEq/L (ref 96–112)
Creatinine, Ser: 0.83 mg/dL (ref 0.40–1.20)
GFR: 65.82 mL/min (ref >60.00)
Glucose, Bld: 83 mg/dL (ref 70–99)
Potassium: 4.1 mEq/L (ref 3.5–5.1)
Sodium: 141 mEq/L (ref 135–145)
Total Bilirubin: 0.9 mg/dL (ref 0.2–1.2)
Total Protein: 7.3 g/dL (ref 6.0–8.3)

## 2020-01-14 LAB — CBC
HCT: 39 % (ref 36.0–46.0)
Hemoglobin: 13.1 g/dL (ref 12.0–15.0)
MCHC: 33.6 g/dL (ref 30.0–36.0)
MCV: 94.1 fl (ref 78.0–100.0)
Platelets: 149 10*3/uL — ABNORMAL LOW (ref 150.0–400.0)
RBC: 4.14 Mil/uL (ref 3.87–5.11)
RDW: 13.9 % (ref 11.5–15.5)
WBC: 4.7 10*3/uL (ref 4.0–10.5)

## 2020-01-14 LAB — HEMOGLOBIN A1C: Hgb A1c MFr Bld: 6 % (ref 4.6–6.5)

## 2020-01-15 NOTE — Assessment & Plan Note (Signed)
BP mildly elevated today and she is unclear if she has taken her losartan and checking CMP and adjust as needed. She is at high CV risk due to smoking and age and BP.

## 2020-01-15 NOTE — Assessment & Plan Note (Signed)
Advised to quit and she declines being able to attempt at this time.

## 2020-01-15 NOTE — Assessment & Plan Note (Signed)
Flu shot given. Covid-19 gotten but she does not know dates advised to bring card. Pneumonia complete. Shingrix counseled. Tetanus due declines. Colonoscopy aged out. Mammogram aged out, pap smear aged out and dexa declines further. Counseled about sun safety and mole surveillance. Counseled about the dangers of distracted driving. Given 10 year screening recommendations.

## 2020-01-15 NOTE — Assessment & Plan Note (Signed)
Checking HgA1c and adjust as needed.  

## 2020-01-15 NOTE — Assessment & Plan Note (Signed)
Gets hydrocodone and rare oxycodone from her pain management provider.

## 2020-01-15 NOTE — Assessment & Plan Note (Signed)
It is unclear that she is describing anxiety symptoms but previously was taking lorazepam for energy. She is again requesting this but given her pain medication this is a high risk interaction. She is taking buspar and is functional. She is asked if she is taking mirtazepine which was prescribed at last visit and she is not sure she ever filled this. We have advised that this is safest. She has also been intermittently filling lorazepam from her cardiologist and has had concerning fill pattern in the past which is when we stopped prescribing this for her.

## 2020-01-16 ENCOUNTER — Telehealth: Payer: Self-pay

## 2020-01-16 NOTE — Telephone Encounter (Signed)
Pt states that she has anxiety that intermittently occurs.  She is requesting something different as she believes her Buspar is not working.  Upon questioning, she does not take her buspar as prescribed.  Pt instructed to take medication as prescribed by doctor.  She also states she seldom takes the Mirtazapine.  Pt encouraged to take as prescribed. Pt requesting refill on Buspar. Last RF: 11/14/19 Last OV: 01/13/20

## 2020-01-16 NOTE — Telephone Encounter (Signed)
She should be able to contact pharmacy for refill as done in Oct for #60 2 refills. If out okay to refill. Would recommend to try taking mirtazepine regularly. See recent office note for more details.

## 2020-01-19 ENCOUNTER — Other Ambulatory Visit: Payer: Self-pay | Admitting: Internal Medicine

## 2020-03-23 ENCOUNTER — Telehealth: Payer: Self-pay | Admitting: Internal Medicine

## 2020-03-23 NOTE — Telephone Encounter (Signed)
Patient called and said that busPIRone (BUSPAR) 5 MG tablet is not helping her. She was wondering if something else could be sent in to [facility], [address] AT [address]. Please advise.

## 2020-03-24 NOTE — Telephone Encounter (Signed)
We could try a medicine like zoloft if she wants or try counseling or offer visit to discuss.

## 2020-03-24 NOTE — Telephone Encounter (Signed)
See below

## 2020-03-30 NOTE — Telephone Encounter (Signed)
LVM for the patient to return my call here at the office. Office number was provided as well.

## 2020-04-14 DIAGNOSIS — I10 Essential (primary) hypertension: Secondary | ICD-10-CM | POA: Diagnosis not present

## 2020-04-14 DIAGNOSIS — R531 Weakness: Secondary | ICD-10-CM | POA: Diagnosis not present

## 2020-04-14 DIAGNOSIS — I2510 Atherosclerotic heart disease of native coronary artery without angina pectoris: Secondary | ICD-10-CM | POA: Diagnosis not present

## 2020-04-14 DIAGNOSIS — R072 Precordial pain: Secondary | ICD-10-CM | POA: Diagnosis not present

## 2020-05-01 DIAGNOSIS — Z79891 Long term (current) use of opiate analgesic: Secondary | ICD-10-CM | POA: Diagnosis not present

## 2020-05-29 ENCOUNTER — Other Ambulatory Visit: Payer: Self-pay | Admitting: Internal Medicine

## 2020-07-13 ENCOUNTER — Ambulatory Visit: Payer: Medicare HMO | Admitting: Internal Medicine

## 2020-07-13 DIAGNOSIS — Z0289 Encounter for other administrative examinations: Secondary | ICD-10-CM

## 2020-08-07 ENCOUNTER — Other Ambulatory Visit: Payer: Self-pay

## 2020-08-07 ENCOUNTER — Encounter: Payer: Self-pay | Admitting: Internal Medicine

## 2020-08-07 ENCOUNTER — Ambulatory Visit (INDEPENDENT_AMBULATORY_CARE_PROVIDER_SITE_OTHER): Payer: Medicare HMO | Admitting: Internal Medicine

## 2020-08-07 VITALS — BP 134/70 | HR 96 | Temp 98.5°F | Resp 18 | Ht 61.0 in | Wt 136.2 lb

## 2020-08-07 DIAGNOSIS — M7918 Myalgia, other site: Secondary | ICD-10-CM | POA: Diagnosis not present

## 2020-08-07 DIAGNOSIS — G8929 Other chronic pain: Secondary | ICD-10-CM | POA: Diagnosis not present

## 2020-08-07 DIAGNOSIS — G894 Chronic pain syndrome: Secondary | ICD-10-CM | POA: Diagnosis not present

## 2020-08-07 DIAGNOSIS — F411 Generalized anxiety disorder: Secondary | ICD-10-CM

## 2020-08-07 DIAGNOSIS — R739 Hyperglycemia, unspecified: Secondary | ICD-10-CM | POA: Diagnosis not present

## 2020-08-07 DIAGNOSIS — R69 Illness, unspecified: Secondary | ICD-10-CM | POA: Diagnosis not present

## 2020-08-07 LAB — POCT GLYCOSYLATED HEMOGLOBIN (HGB A1C): Hemoglobin A1C: 5.8 % — AB (ref 4.0–5.6)

## 2020-08-07 MED ORDER — MIRTAZAPINE 15 MG PO TABS: 15.0000 mg | ORAL_TABLET | Freq: Every day | ORAL | 1 refills | Status: DC

## 2020-08-07 MED ORDER — DICLOFENAC SODIUM 1 % EX GEL: 2.0000 g | Freq: Four times a day (QID) | CUTANEOUS | 11 refills | Status: DC

## 2020-08-07 NOTE — Progress Notes (Signed)
   Subjective:   Patient ID: [name], female    DOB: 04-Oct-1935, 85 y.o.   MRN: [id number]  HPI The patient is an 85 YO female coming in for concerns and follow up of multiple conditions.   Review of Systems  Constitutional: Negative.   HENT: Negative.    Eyes: Negative.   Respiratory:  Negative for cough, chest tightness and shortness of breath.   Cardiovascular:  Negative for chest pain, palpitations and leg swelling.  Gastrointestinal:  Negative for abdominal distention, abdominal pain, constipation, diarrhea, nausea and vomiting.  Musculoskeletal:  Positive for arthralgias.  Skin: Negative.   Neurological: Negative.   Psychiatric/Behavioral:  Positive for dysphoric mood.    Objective:  Physical Exam Constitutional:      Appearance: She is well-developed.  HENT:     Head: Normocephalic and atraumatic.  Cardiovascular:     Rate and Rhythm: Normal rate and regular rhythm.  Pulmonary:     Effort: Pulmonary effort is normal. No respiratory distress.     Breath sounds: Normal breath sounds. No wheezing or rales.  Abdominal:     General: Bowel sounds are normal. There is no distension.     Palpations: Abdomen is soft.     Tenderness: There is no abdominal tenderness. There is no rebound.  Musculoskeletal:        General: Tenderness present.     Cervical back: Normal range of motion.  Skin:    General: Skin is warm and dry.  Neurological:     Mental Status: She is alert and oriented to person, place, and time.     Coordination: Coordination normal.    Vitals:   08/07/20 1059  BP: 134/70  Pulse: 96  Resp: 18  Temp: 98.5 F (36.9 C)  SpO2: 96%  Weight: 136 lb 3.2 oz (61.8 kg)  Height: 5\' 1"  (1.549 m)    This visit occurred during the SARS-CoV-2 public health emergency.  Safety protocols were in place, including screening questions prior to the visit, additional usage of staff PPE, and extensive cleaning of exam room while observing appropriate contact time as  indicated for disinfecting solutions.   Assessment & Plan:

## 2020-08-07 NOTE — Assessment & Plan Note (Signed)
Rx voltaren gel as this has helped some and she got from orthopedic initially and needs a new rx for this.

## 2020-08-07 NOTE — Assessment & Plan Note (Signed)
She is taking buspar up to BID and not helping enough. Will start remeron 15 mg qhs as she did not take this when prescribed in the past.

## 2020-08-07 NOTE — Assessment & Plan Note (Signed)
Still seeing orthopedic for chronic pain management with hydrocodone.

## 2020-08-07 NOTE — Assessment & Plan Note (Signed)
POC HgA1c done today at 5.8. She is working on diet and this is helping.

## 2020-08-07 NOTE — Patient Instructions (Signed)
We have sent in the gel to use on the knees.   We have also sent in remeron (mirtazapine) that you take at night time which helps with the anxiety during the day.

## 2020-08-26 ENCOUNTER — Telehealth: Payer: Self-pay

## 2020-08-26 NOTE — Telephone Encounter (Signed)
PA for Diclofenac Gel   KEY: [id number]

## 2020-10-02 ENCOUNTER — Other Ambulatory Visit: Payer: Self-pay | Admitting: Internal Medicine

## 2020-10-07 DIAGNOSIS — M5416 Radiculopathy, lumbar region: Secondary | ICD-10-CM | POA: Diagnosis not present

## 2020-10-07 DIAGNOSIS — M5459 Other low back pain: Secondary | ICD-10-CM | POA: Diagnosis not present

## 2020-12-21 ENCOUNTER — Encounter (HOSPITAL_COMMUNITY): Payer: Self-pay | Admitting: *Deleted

## 2020-12-21 ENCOUNTER — Other Ambulatory Visit: Payer: Self-pay

## 2020-12-21 ENCOUNTER — Emergency Department (HOSPITAL_COMMUNITY)
Admission: EM | Admit: 2020-12-21 | Discharge: 2020-12-22 | Disposition: A | Discharge status: Home / Self Care | Payer: Medicare HMO | Attending: Emergency Medicine | Admitting: Emergency Medicine

## 2020-12-21 DIAGNOSIS — S0121XA Laceration without foreign body of nose, initial encounter: Secondary | ICD-10-CM | POA: Diagnosis not present

## 2020-12-21 DIAGNOSIS — R519 Headache, unspecified: Secondary | ICD-10-CM | POA: Diagnosis not present

## 2020-12-21 DIAGNOSIS — F17210 Nicotine dependence, cigarettes, uncomplicated: Secondary | ICD-10-CM | POA: Insufficient documentation

## 2020-12-21 DIAGNOSIS — Z79899 Other long term (current) drug therapy: Secondary | ICD-10-CM | POA: Diagnosis not present

## 2020-12-21 DIAGNOSIS — M542 Cervicalgia: Secondary | ICD-10-CM | POA: Diagnosis not present

## 2020-12-21 DIAGNOSIS — I10 Essential (primary) hypertension: Secondary | ICD-10-CM | POA: Diagnosis not present

## 2020-12-21 DIAGNOSIS — S0992XA Unspecified injury of nose, initial encounter: Secondary | ICD-10-CM | POA: Diagnosis not present

## 2020-12-21 DIAGNOSIS — W01198A Fall on same level from slipping, tripping and stumbling with subsequent striking against other object, initial encounter: Secondary | ICD-10-CM | POA: Insufficient documentation

## 2020-12-21 DIAGNOSIS — Z7982 Long term (current) use of aspirin: Secondary | ICD-10-CM | POA: Insufficient documentation

## 2020-12-21 DIAGNOSIS — I2510 Atherosclerotic heart disease of native coronary artery without angina pectoris: Secondary | ICD-10-CM | POA: Diagnosis not present

## 2020-12-21 DIAGNOSIS — S022XXA Fracture of nasal bones, initial encounter for closed fracture: Secondary | ICD-10-CM | POA: Diagnosis not present

## 2020-12-21 DIAGNOSIS — S0181XA Laceration without foreign body of other part of head, initial encounter: Secondary | ICD-10-CM

## 2020-12-21 DIAGNOSIS — M47812 Spondylosis without myelopathy or radiculopathy, cervical region: Secondary | ICD-10-CM | POA: Diagnosis not present

## 2020-12-21 DIAGNOSIS — W19XXXA Unspecified fall, initial encounter: Secondary | ICD-10-CM

## 2020-12-21 DIAGNOSIS — Y92009 Unspecified place in unspecified non-institutional (private) residence as the place of occurrence of the external cause: Secondary | ICD-10-CM

## 2020-12-21 DIAGNOSIS — Z23 Encounter for immunization: Secondary | ICD-10-CM | POA: Insufficient documentation

## 2020-12-21 DIAGNOSIS — R69 Illness, unspecified: Secondary | ICD-10-CM | POA: Diagnosis not present

## 2020-12-21 MED ORDER — LIDOCAINE-EPINEPHRINE (PF) 2 %-1:200000 IJ SOLN
10.0000 mL | Freq: Once | INTRAMUSCULAR | Status: AC
  Administered 2020-12-21: 10 mL

## 2020-12-21 MED ORDER — LIDOCAINE HCL 2 % IJ SOLN
INTRAMUSCULAR | Status: AC
  Filled 2020-12-21: qty 20

## 2020-12-21 MED ORDER — ACETAMINOPHEN 500 MG PO TABS
ORAL_TABLET | ORAL | Status: AC
  Filled 2020-12-21: qty 2

## 2020-12-21 MED ORDER — TETANUS-DIPHTH-ACELL PERTUSSIS 5-2.5-18.5 LF-MCG/0.5 IM SUSY
PREFILLED_SYRINGE | INTRAMUSCULAR | Status: AC
  Filled 2020-12-21: qty 0.5

## 2020-12-21 MED ORDER — TETANUS-DIPHTH-ACELL PERTUSSIS 5-2.5-18.5 LF-MCG/0.5 IM SUSY
0.5000 mL | PREFILLED_SYRINGE | Freq: Once | INTRAMUSCULAR | Status: AC
  Administered 2020-12-22: 0.5 mL via INTRAMUSCULAR

## 2020-12-21 MED ORDER — LIDOCAINE-EPINEPHRINE (PF) 2 %-1:200000 IJ SOLN
INTRAMUSCULAR | Status: AC
  Filled 2020-12-21: qty 20

## 2020-12-21 MED ORDER — ACETAMINOPHEN 500 MG PO TABS
1000.0000 mg | ORAL_TABLET | Freq: Once | ORAL | Status: AC
  Administered 2020-12-22: 1000 mg via ORAL

## 2020-12-21 NOTE — ED Triage Notes (Signed)
Pt stated that a baby was crying in her house and she lost her balance and fell. Pt reports taking blood thinners. Unable to recall which one. Bandage to nose and head with active bleeding noted

## 2020-12-21 NOTE — ED Provider Notes (Signed)
[facility] EMERGENCY DEPARTMENT Provider Note   CSN: [id number] Arrival date & time: 12/21/20  2030     History Chief Complaint  Patient presents with   Lytle Michaels    [name] is a 85 y.o. female.  HPI  85 year old female with past medical history of HTN, HLD, CAD, takes a baby aspirin a day but denies any Plavix/anticoagulation presents emergency department with fall and head injury.  Patient states that she was holding a coffee cup, turned in a doorway and lost her balance, she fell forward and believes the cut hit her nasal bridge.  No loss of consciousness.  Patient is having bleeding from the nasal bridge from a sustained cuts.  Right now she is complaining of frontal and nasal head discomfort with mild neck pain.  No other injury sustained.  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   Gastritis    GERD (gastroesophageal reflux disease)    Hyperlipidemia    IBS (irritable bowel syndrome)    Insomnia 02/28/2014   Internal hemorrhoids    Lymphadenopathy 10/22/2012   Monoclonal gammopathy    Sore throat 04/06/2015   Urge incontinence    Vitamin B12 deficiency     Patient Active Problem List   Diagnosis Date Noted   Constipation 06/20/2017   Acute low back pain 06/20/2017   Hyperglycemia 05/18/2017   Lumbar radiculopathy 04/25/2017   Chronic pain syndrome 04/06/2017   Trigger finger of right hand 02/24/2017   Mass of left forearm 06/09/2016   Routine general medical examination at a health care facility 05/29/2016   Abnormal imaging of thyroid 04/06/2015   Dysphagia 04/06/2015   Chronic musculoskeletal pain 03/26/2014   Insomnia 02/28/2014   History of B-cell  lymphoma 10/07/2013   B12 deficiency 10/25/2007   IBS 03/19/2007   Migraine 03/19/2007   MONOCLONAL GAMMOPATHY 01/01/2007   Anxiety state 03/23/2006   TOBACCO DEPENDENCE 03/23/2006   HEARING LOSS NOS OR DEAFNESS 03/23/2006   Essential hypertension 03/23/2006   INCONTINENCE, URGE 03/23/2006    Past Surgical History:  Procedure Laterality Date   ABDOMINAL HYSTERECTOMY  1992   with ovarian cystectomy   CORONARY STENT PLACEMENT     EYE SURGERY     cataract   HEMICOLECTOMY     ileocolonic anasomosis     SPINE SURGERY       OB History   No obstetric history on file.     Family History  Problem Relation Age of Onset   Kidney disease Mother    Stroke Mother    Diabetes Other        sibling   Colon cancer Neg Hx     Social History   Tobacco Use   Smoking status: Every Day    Packs/day: 0.50    Years: 50.00    Pack years: 25.00    Types: Cigarettes   Smokeless tobacco: Never  Vaping Use   Vaping Use: Never used  Substance Use Topics   Alcohol use: No   Drug use: No    Home Medications Prior to Admission medications   Medication Sig Start Date End Date Taking? Authorizing  Provider  aspirin 81 MG tablet Take 81 mg by mouth daily.     Yes [provider]  busPIRone (BUSPAR) 5 MG tablet TAKE 1 TABLET(5 MG) BY MOUTH TWICE DAILY 10/02/20  Yes Hoyt Koch, MD  famotidine (PEPCID) 20 MG tablet Take 20 mg by mouth 2 (two) times daily.   Yes [provider]  HYDROcodone-acetaminophen (NORCO) 10-325 MG tablet Take 1 tablet by mouth every 6 (six) hours as needed for pain. 04/25/17  Yes [provider]  losartan (COZAAR) 100 MG tablet Take 100 mg by mouth daily. 12/30/19  Yes [provider]  metoprolol succinate (TOPROL-XL) 25 MG 24 hr tablet 25 mg daily.   Yes [provider]  nitroGLYCERIN (NITROLINGUAL) 0.4 MG/SPRAY spray Place 1 spray under the tongue every 5 (five) minutes x 3 doses as needed for chest pain.   Yes  [provider]  diclofenac Sodium (VOLTAREN) 1 % GEL Apply 2 g topically 4 (four) times daily. Patient taking differently: Apply 2 g topically 4 (four) times daily as needed (pain). 08/07/20   Hoyt Koch, MD  mirtazapine (REMERON) 15 MG tablet Take 1 tablet (15 mg total) by mouth at bedtime. Annual appt due in Dec must see provider for future refills Patient not taking: Reported on 12/21/2020 08/07/20   Hoyt Koch, MD    Allergies    Patient has no known allergies.  Review of Systems   Review of Systems  Constitutional:  Negative for chills and fever.  HENT:  Negative for congestion.   Eyes:  Negative for visual disturbance.  Respiratory:  Negative for shortness of breath.   Cardiovascular:  Negative for chest pain.  Gastrointestinal:  Negative for abdominal pain, diarrhea and vomiting.  Genitourinary:  Negative for dysuria.  Musculoskeletal:  Positive for neck pain. Negative for back pain.  Skin:  Negative for rash.  Neurological:  Positive for headaches.   Physical Exam Updated Vital Signs BP (!) 150/68   Pulse 88   Temp 98.6 F (37 C) (Oral)   Resp 16   Ht 5\' 2"  (1.575 m)   Wt 61.7 kg   SpO2 99%   BMI 24.87 kg/m   Physical Exam Vitals and nursing note reviewed.  Constitutional:      General: She is not in acute distress.    Appearance: Normal appearance.  HENT:     Head: Normocephalic.     Comments: Midface stable    Right Ear: External ear normal.     Left Ear: External ear normal.     Nose:     Comments: Flap-like laceration to the superior aspect of the nasal bridge, slight bleeding on exam, no pulsatile bleeding    Mouth/Throat:     Mouth: Mucous membranes are moist.     Comments: Intact dentition, normal tongue, no blood in the mouth Eyes:     Extraocular Movements: Extraocular movements intact.     Pupils: Pupils are equal, round, and reactive to light.  Neck:     Comments: C-collar placed Cardiovascular:     Rate and  Rhythm: Normal rate.  Pulmonary:     Effort: Pulmonary effort is normal. No respiratory distress.  Abdominal:     Palpations: Abdomen is soft.     Tenderness: There is no abdominal tenderness.  Musculoskeletal:        General: No swelling or deformity.     Cervical back: Tenderness present.  Skin:    General: Skin is warm.  Neurological:  Mental Status: She is alert and oriented to person, place, and time. Mental status is at baseline.  Psychiatric:        Mood and Affect: Mood normal.    ED Results / Procedures / Treatments   Labs (all labs ordered are listed, but only abnormal results are displayed) Labs Reviewed - No data to display  EKG EKG Interpretation  Date/Time:  Monday December 21 2020 21:14:20 EST Ventricular Rate:  88 PR Interval:  188 QRS Duration: 86 QT Interval:  353 QTC Calculation: 428 R Axis:   66 Text Interpretation: Sinus rhythm Anteroseptal infarct, old Confirmed by Lavenia Atlas [phone]) on 12/21/2020 9:32:53 PM  Radiology No results found.  Procedures Procedures   Medications Ordered in ED Medications - No data to display  ED Course  I have reviewed the triage vital signs and the nursing notes.  Pertinent labs & imaging results that were available during my care of the patient were reviewed by me and considered in my medical decision making (see chart for details).    MDM Rules/Calculators/A&P                           85 year old female presents emergency department with reported mechanical fall.  Patient states that she lost her balance at home and fell forward hitting her nasal bridge.  She sustained a laceration to the nasal bridge.  No reported loss of consciousness.  No syncope or seizure activity.  No noted anticoagulation.  Patient originally seen as a level 2 trauma for fall with head injury on anticoagulation.  However patient states that she is not on any anticoagulation, level 2 trauma canceled.  Nose laceration will be  repaired by midlevel.  Plan to get CT imaging of the head, face and neck, cervical collar in place.  Pending this imaging and reevaluation will determine patient's disposition.  Final Clinical Impression(s) / ED Diagnoses Final diagnoses:  None    Rx / DC Orders ED Discharge Orders     None        Lorelle Gibbs, DO 12/21/20 2315

## 2020-12-21 NOTE — ED Notes (Signed)
Trauma downgraded at this time. Pt not on anticoagulants.

## 2020-12-22 ENCOUNTER — Emergency Department (HOSPITAL_COMMUNITY): Payer: Medicare HMO

## 2020-12-22 DIAGNOSIS — S022XXA Fracture of nasal bones, initial encounter for closed fracture: Secondary | ICD-10-CM | POA: Diagnosis not present

## 2020-12-22 DIAGNOSIS — M47812 Spondylosis without myelopathy or radiculopathy, cervical region: Secondary | ICD-10-CM | POA: Diagnosis not present

## 2020-12-22 NOTE — Progress Notes (Signed)
Orthopedic Tech Progress Note Patient Details:  [name] April 14, 1935 [id number]  Patient ID: [name], female   DOB: [birth year], 85 y.o.   MRN: [id number] I attended trauma page Karolee Stamps 12/22/2020, 1:09 AM

## 2020-12-22 NOTE — Discharge Instructions (Addendum)
For pain control you may take at 1000 mg of Tylenol every 8 hours scheduled.    Do not let your laceration (cut) get wet for the next 48 hours. After that you may allow soapy water to drain down the wound to clean it. Please do not scrub.  To minimize scarring, you can apply a vaseline based ointment for the next 2 weeks and keep it out of direct sun light. After that, you may apply sunscreen for the next several months. Your stitches will need to be removed in 5-7 days.  Return if your wound appears to be infected (see laceration care instructions).

## 2020-12-22 NOTE — ED Provider Notes (Signed)
I assumed care of this patient.  Please see previous provider note for further details of Hx, PE.  Briefly patient is a 85 y.o. female who presented after a fall at home resulting in laceration to the face and nasal bone fracture. Closed by prior provider. Pending CTs.  CT with nasal bone fracture. No other injuries noted.  The patient appears reasonably screened and/or stabilized for discharge and I doubt any other medical condition or other [facility] requiring further screening, evaluation, or treatment in the ED at this time prior to discharge. Safe for discharge with strict return precautions.  Disposition: Discharge  Condition: Good  I have discussed the results, Dx and Tx plan with the patient/family who expressed understanding and agree(s) with the plan. Discharge instructions discussed at length. The patient/family was given strict return precautions who verbalized understanding of the instructions. No further questions at time of discharge.    ED Discharge Orders     None        Follow Up: [place] [address] [id number] [place] [phone] Go to  in 5-7 days, for suture removal  Jerrell Belfast, MD [address] [place] [address] [phone]  Call  as needed if you have difficulty breathing through your nose (related to your nose fracture)         Yukari Flax, Grayce Sessions, MD 12/22/20 0301

## 2020-12-22 NOTE — ED Notes (Signed)
Pt discharged and wheeled out of the ED in a wheel chair without difficulty. 

## 2020-12-30 ENCOUNTER — Other Ambulatory Visit: Payer: Self-pay

## 2020-12-30 ENCOUNTER — Ambulatory Visit (HOSPITAL_COMMUNITY)
Admission: EM | Admit: 2020-12-30 | Discharge: 2020-12-30 | Disposition: A | Discharge status: Home / Self Care | Payer: Medicare HMO

## 2020-12-30 NOTE — ED Triage Notes (Signed)
Pt presents for suture removal and states she feels sore in the affected area.

## 2021-02-03 DIAGNOSIS — E441 Mild protein-calorie malnutrition: Secondary | ICD-10-CM | POA: Diagnosis not present

## 2021-02-03 DIAGNOSIS — I2510 Atherosclerotic heart disease of native coronary artery without angina pectoris: Secondary | ICD-10-CM | POA: Diagnosis not present

## 2021-02-03 DIAGNOSIS — J449 Chronic obstructive pulmonary disease, unspecified: Secondary | ICD-10-CM | POA: Diagnosis not present

## 2021-02-03 DIAGNOSIS — I10 Essential (primary) hypertension: Secondary | ICD-10-CM | POA: Diagnosis not present

## 2021-02-22 ENCOUNTER — Ambulatory Visit: Payer: Medicare HMO

## 2021-02-25 ENCOUNTER — Ambulatory Visit: Payer: Medicare HMO

## 2021-03-04 ENCOUNTER — Ambulatory Visit: Payer: Medicare HMO

## 2021-03-12 ENCOUNTER — Other Ambulatory Visit: Payer: Self-pay | Admitting: Internal Medicine

## 2021-03-24 ENCOUNTER — Other Ambulatory Visit: Payer: Self-pay

## 2021-03-24 ENCOUNTER — Ambulatory Visit (INDEPENDENT_AMBULATORY_CARE_PROVIDER_SITE_OTHER): Payer: Medicare HMO

## 2021-03-24 VITALS — BP 130/70 | HR 110 | Temp 97.7°F | Ht 61.0 in | Wt 130.8 lb

## 2021-03-24 DIAGNOSIS — Z0000 Encounter for general adult medical examination without abnormal findings: Secondary | ICD-10-CM | POA: Diagnosis not present

## 2021-03-24 NOTE — Patient Instructions (Signed)
[name] , Thank you for taking time to come for your Medicare Wellness Visit. I appreciate your ongoing commitment to your health goals. Please review the following plan we discussed and let me know if I can assist you in the future.   Screening recommendations/referrals: Colonoscopy: Not a candidate for screening due to age 86: Not a candidate for screening due to age Bone Density: Never done Recommended yearly ophthalmology/optometry visit for glaucoma screening and checkup Recommended yearly dental visit for hygiene and checkup  Vaccinations: Influenza vaccine: declined Pneumococcal vaccine: 04/24/2004, 05/16/2016 Tdap vaccine: 12/22/2020; due every 10 years Shingles vaccine: declined   Covid-19: declined  Advanced directives: Please bring a copy of your health care power of attorney and living will to the office at your convenience.  Conditions/risks identified: Yes; Client understands the importance of follow-up appointments with providers by attending scheduled visits and discussed goals to eat healthier, increase physical activity 5 times a week for 30 minutes each, exercise the brain by doing stimulating brain exercises (reading, adult coloring, crafting, listening to music, puzzles, etc.), socialize and enjoy life more, get enough sleep at least 8-9 hours average per night and make time for laughter.  Next appointment: Please schedule your next Medicare Wellness Visit with your Nurse Health Advisor in 1 year by calling [phone].   Preventive Care 24 Years and Older, Female Preventive care refers to lifestyle choices and visits with your health care provider that can promote health and wellness. What does preventive care include? A yearly physical exam. This is also called an annual well check. Dental exams once or twice a year. Routine eye exams. Ask your health care provider how often you should have your eyes checked. Personal lifestyle choices, including: Daily  care of your teeth and gums. Regular physical activity. Eating a healthy diet. Avoiding tobacco and drug use. Limiting alcohol use. Practicing safe sex. Taking low-dose aspirin every day. Taking vitamin and mineral supplements as recommended by your health care provider. What happens during an annual well check? The services and screenings done by your health care provider during your annual well check will depend on your age, overall health, lifestyle risk factors, and family history of disease. Counseling  Your health care provider may ask you questions about your: Alcohol use. Tobacco use. Drug use. Emotional well-being. Home and relationship well-being. Sexual activity. Eating habits. History of falls. Memory and ability to understand (cognition). Work and work Statistician. Reproductive health. Screening  You may have the following tests or measurements: Height, weight, and BMI. Blood pressure. Lipid and cholesterol levels. These may be checked every 5 years, or more frequently if you are over 39 years old. Skin check. Lung cancer screening. You may have this screening every year starting at age 43 if you have a 30-pack-year history of smoking and currently smoke or have quit within the past 15 years. Fecal occult blood test (FOBT) of the stool. You may have this test every year starting at age 6. Flexible sigmoidoscopy or colonoscopy. You may have a sigmoidoscopy every 5 years or a colonoscopy every 10 years starting at age 70. Hepatitis C blood test. Hepatitis B blood test. Sexually transmitted disease (STD) testing. Diabetes screening. This is done by checking your blood sugar (glucose) after you have not eaten for a while (fasting). You may have this done every 1-3 years. Bone density scan. This is done to screen for osteoporosis. You may have this done starting at age 53. Mammogram. This may be done every 1-2 years.  Talk to your health care provider about how often you  should have regular mammograms. Talk with your health care provider about your test results, treatment options, and if necessary, the need for more tests. Vaccines  Your health care provider may recommend certain vaccines, such as: Influenza vaccine. This is recommended every year. Tetanus, diphtheria, and acellular pertussis (Tdap, Td) vaccine. You may need a Td booster every 10 years. Zoster vaccine. You may need this after age 15. Pneumococcal 13-valent conjugate (PCV13) vaccine. One dose is recommended after age 100. Pneumococcal polysaccharide (PPSV23) vaccine. One dose is recommended after age 29. Talk to your health care provider about which screenings and vaccines you need and how often you need them. This information is not intended to replace advice given to you by your health care provider. Make sure you discuss any questions you have with your health care provider. Document Released: 02/06/2015 Document Revised: 09/30/2015 Document Reviewed: 11/11/2014 Elsevier Interactive Patient Education  2017 [place] Prevention in the Home Falls can cause injuries. They can happen to people of all ages. There are many things you can do to make your home safe and to help prevent falls. What can I do on the outside of my home? Regularly fix the edges of walkways and driveways and fix any cracks. Remove anything that might make you trip as you walk through a door, such as a raised step or threshold. Trim any bushes or trees on the path to your home. Use bright outdoor lighting. Clear any walking paths of anything that might make someone trip, such as rocks or tools. Regularly check to see if handrails are loose or broken. Make sure that both sides of any steps have handrails. Any raised decks and porches should have guardrails on the edges. Have any leaves, snow, or ice cleared regularly. Use sand or salt on walking paths during winter. Clean up any spills in your garage right away.  This includes oil or grease spills. What can I do in the bathroom? Use night lights. Install grab bars by the toilet and in the tub and shower. Do not use towel bars as grab bars. Use non-skid mats or decals in the tub or shower. If you need to sit down in the shower, use a plastic, non-slip stool. Keep the floor dry. Clean up any water that spills on the floor as soon as it happens. Remove soap buildup in the tub or shower regularly. Attach bath mats securely with double-sided non-slip rug tape. Do not have throw rugs and other things on the floor that can make you trip. What can I do in the bedroom? Use night lights. Make sure that you have a light by your bed that is easy to reach. Do not use any sheets or blankets that are too big for your bed. They should not hang down onto the floor. Have a firm chair that has side arms. You can use this for support while you get dressed. Do not have throw rugs and other things on the floor that can make you trip. What can I do in the kitchen? Clean up any spills right away. Avoid walking on wet floors. Keep items that you use a lot in easy-to-reach places. If you need to reach something above you, use a strong step stool that has a grab bar. Keep electrical cords out of the way. Do not use floor polish or wax that makes floors slippery. If you must use wax, use non-skid floor wax.  Do not have throw rugs and other things on the floor that can make you trip. What can I do with my stairs? Do not leave any items on the stairs. Make sure that there are handrails on both sides of the stairs and use them. Fix handrails that are broken or loose. Make sure that handrails are as long as the stairways. Check any carpeting to make sure that it is firmly attached to the stairs. Fix any carpet that is loose or worn. Avoid having throw rugs at the top or bottom of the stairs. If you do have throw rugs, attach them to the floor with carpet tape. Make sure that  you have a light switch at the top of the stairs and the bottom of the stairs. If you do not have them, ask someone to add them for you. What else can I do to help prevent falls? Wear shoes that: Do not have high heels. Have rubber bottoms. Are comfortable and fit you well. Are closed at the toe. Do not wear sandals. If you use a stepladder: Make sure that it is fully opened. Do not climb a closed stepladder. Make sure that both sides of the stepladder are locked into place. Ask someone to hold it for you, if possible. Clearly mark and make sure that you can see: Any grab bars or handrails. First and last steps. Where the edge of each step is. Use tools that help you move around (mobility aids) if they are needed. These include: Canes. Walkers. Scooters. Crutches. Turn on the lights when you go into a dark area. Replace any light bulbs as soon as they burn out. Set up your furniture so you have a clear path. Avoid moving your furniture around. If any of your floors are uneven, fix them. If there are any pets around you, be aware of where they are. Review your medicines with your doctor. Some medicines can make you feel dizzy. This can increase your chance of falling. Ask your doctor what other things that you can do to help prevent falls. This information is not intended to replace advice given to you by your health care provider. Make sure you discuss any questions you have with your health care provider. Document Released: 11/06/2008 Document Revised: 06/18/2015 Document Reviewed: 02/14/2014 Elsevier Interactive Patient Education  2017 [employer].

## 2021-03-24 NOTE — Progress Notes (Signed)
Subjective:   [name] is a 86 y.o. female who presents for Medicare Annual (Subsequent) preventive examination.  Review of Systems     Cardiac Risk Factors include: advanced age (>56men, >29 women);hypertension;family history of premature cardiovascular disease;smoking/ tobacco exposure     Objective:    Today's Vitals   03/24/21 1525  Pulse: (!) 110  Temp: 97.7 F (36.5 C)  SpO2: 98%  Weight: 130 lb 12.8 oz (59.3 kg)  Height: 5\' 1"  (1.549 m)  PainSc: 0-No pain   Body mass index is 24.71 kg/m.  Advanced Directives 03/24/2021 05/19/2017 12/07/2015 09/24/2015 09/17/2015 04/06/2015 09/23/2014  Does Patient Have a Medical Advance Directive? Yes No No No No No No  Type of Advance Directive Living will;Healthcare Power of Attorney - - - - - -  Does patient want to make changes to medical advance directive? No - Patient declined - - - - - -  Copy of [place] in Chart? No - copy requested - - - - - -  Would patient like information on creating a medical advance directive? - No - Patient declined No - patient declined information - No - patient declined information No - patient declined information No - patient declined information    Current Medications (verified) Outpatient Encounter Medications as of 03/24/2021  Medication Sig   aspirin 81 MG tablet Take 81 mg by mouth daily.     busPIRone (BUSPAR) 5 MG tablet TAKE 1 TABLET(5 MG) BY MOUTH TWICE DAILY   diclofenac Sodium (VOLTAREN) 1 % GEL Apply 2 g topically 4 (four) times daily. (Patient taking differently: Apply 2 g topically 4 (four) times daily as needed (pain).)   famotidine (PEPCID) 20 MG tablet Take 20 mg by mouth 2 (two) times daily.   HYDROcodone-acetaminophen (NORCO) 10-325 MG tablet Take 1 tablet by mouth every 6 (six) hours as needed for pain.   losartan (COZAAR) 100 MG tablet Take 100 mg by mouth daily.   metoprolol succinate (TOPROL-XL) 25 MG 24 hr tablet 25 mg daily.   mirtazapine (REMERON) 15  MG tablet Take 1 tablet (15 mg total) by mouth at bedtime. Annual appt due in Dec must see provider for future refills (Patient not taking: Reported on 12/21/2020)   nitroGLYCERIN (NITROLINGUAL) 0.4 MG/SPRAY spray Place 1 spray under the tongue every 5 (five) minutes x 3 doses as needed for chest pain.   No facility-administered encounter medications on file as of 03/24/2021.    Allergies (verified) Patient has no known allergies.   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   Gastritis    GERD (gastroesophageal reflux disease)    Hyperlipidemia    IBS (irritable bowel syndrome)    Insomnia 02/28/2014   Internal hemorrhoids    Lymphadenopathy 10/22/2012   Monoclonal gammopathy    Sore throat 04/06/2015   Urge incontinence    Vitamin B12 deficiency    Past Surgical History:  Procedure Laterality Date   ABDOMINAL HYSTERECTOMY  1992   with ovarian cystectomy   CORONARY STENT PLACEMENT     EYE SURGERY     cataract   HEMICOLECTOMY  ileocolonic anasomosis     SPINE SURGERY     Family History  Problem Relation Age of Onset   Kidney disease Mother    Stroke Mother    Diabetes Other        sibling   Colon cancer Neg Hx    Social History   Socioeconomic History   Marital status: Married    Spouse name: Not on file   Number of children: 2   Years of education: Not on file   Highest education level: Not on file  Occupational History   Occupation: retired  Tobacco Use   Smoking status: Every Day    Packs/day: 0.50    Years: 50.00    Pack years: 25.00    Types: Cigarettes   Smokeless tobacco: Never  Vaping Use   Vaping Use: Never used  Substance and Sexual Activity   Alcohol  use: No   Drug use: No   Sexual activity: Not Currently  Other Topics Concern   Not on file  Social History Narrative   Not on file   Social Determinants of Health   Financial Resource Strain: Low Risk    Difficulty of Paying Living Expenses: Not hard at all  Food Insecurity: No Food Insecurity   Worried About Charity fundraiser in the Last Year: Never true   [place] in the Last Year: Never true  Transportation Needs: No Transportation Needs   Lack of Transportation (Medical): No   Lack of Transportation (Non-Medical): No  Physical Activity: Inactive   Days of Exercise per Week: 0 days   Minutes of Exercise per Session: 0 min  Stress: No Stress Concern Present   Feeling of Stress : Not at all  Social Connections: Moderately Integrated   Frequency of Communication with Friends and Family: More than three times a week   Frequency of Social Gatherings with Friends and Family: More than three times a week   Attends Religious Services: More than 4 times per year   Active Member of [employer] or Organizations: Yes   Attends Archivist Meetings: More than 4 times per year   Marital Status: Widowed    Tobacco Counseling Ready to quit: Not Answered Counseling given: Not Answered   Clinical Intake:  Pre-visit preparation completed: Yes  Pain : No/denies pain Pain Score: 0-No pain     BMI - recorded: 24.71 Nutritional Status: BMI of 19-24  Normal Nutritional Risks: None Diabetes: No  How often do you need to have someone help you when you read instructions, pamphlets, or other written materials from your doctor or pharmacy?: 1 - Never What is the last grade level you completed in school?: HSG  Diabetic? no  Interpreter Needed?: No  Information entered by :: Lisette Abu, LPN   Activities of Daily Living In your present state of health, do you have any difficulty performing the following activities: 03/24/2021 08/07/2020  Hearing? Y N  Vision? N N   Difficulty concentrating or making decisions? N N  Walking or climbing stairs? N N  Dressing or bathing? N N  Doing errands, shopping? N N  Preparing Food and eating ? N -  Using the Toilet? N -  In the past six months, have you accidently leaked urine? N -  Do you have problems with loss of bowel control? N -  Managing your Medications? N -  Managing your Finances? N -  Housekeeping or managing your Housekeeping? N -  Some recent data  might be hidden    Patient Care Team: Hoyt Koch, MD as PCP - General (Internal Medicine) Charlton Haws, [facility] as Pharmacist (Pharmacist)  Indicate any recent Medical Services you may have received from other than [facility] providers in the past year (date may be approximate).     Assessment:   This is a routine wellness examination for Elijah.  Hearing/Vision screen Hearing Screening - Comments:: Patient denied any hearing difficulty.   No hearing aids.  Vision Screening - Comments:: Patient wears corrective glasses/contacts.  Eye exam done annually by: [facility]  Dietary issues and exercise activities discussed: Current Exercise Habits: The patient does not participate in regular exercise at present, Exercise limited by: None identified   Goals Addressed   None   Depression Screen PHQ 2/9 Scores 03/24/2021 01/15/2020 01/10/2019 05/26/2016 11/25/2014  PHQ - 2 Score 1 1 4  0 0  PHQ- 9 Score - - 10 2 -    Fall Risk Fall Risk  03/24/2021 01/15/2020 01/13/2020 02/02/2018 11/25/2014  Falls in the past year? 1 0 1 0 Yes  Number falls in past yr: 0 - 0 - 2 or more  Injury with Fall? 1 - 0 - No  Risk for fall due to : Impaired balance/gait;History of fall(s) - - - -  Follow up Falls evaluation completed - - - -    FALL RISK PREVENTION PERTAINING TO THE HOME:  Any stairs in or around the home? No  If so, are there any without handrails? Yes  Home free of loose throw rugs in walkways, pet beds, electrical cords, etc? Yes  Adequate  lighting in your home to reduce risk of falls? Yes   ASSISTIVE DEVICES UTILIZED TO PREVENT FALLS:  Life alert? Yes  Use of a cane, walker or w/c? Yes  Grab bars in the bathroom? No  Shower chair or bench in shower? No  Elevated toilet seat or a handicapped toilet? No   TIMED UP AND GO:  Was the test performed? Yes .  Length of time to ambulate 10 feet: 12 sec.   Gait slow and steady with assistive device  Cognitive Function: Normal cognitive status assessed by direct observation by this Nurse Health Advisor. No abnormalities found.          Immunizations Immunization History  Administered Date(s) Administered   Fluad Quad(high Dose 65+) 01/13/2020   Influenza, High Dose Seasonal PF 11/18/2014   Influenza,inj,Quad PF,6+ Mos 12/07/2015   Influenza-Unspecified 11/08/2018   Pneumococcal Conjugate-13 05/16/2016   Pneumococcal Polysaccharide-23 04/24/2004   Td 04/24/2004, 02/19/2008   Tdap 12/22/2020    TDAP status: Up to date  Flu Vaccine status: Due, Education has been provided regarding the importance of this vaccine. Advised may receive this vaccine at local pharmacy or Health Dept. Aware to provide a copy of the vaccination record if obtained from local pharmacy or Health Dept. Verbalized acceptance and understanding.  Pneumococcal vaccine status: Up to date  Covid-19 vaccine status: Declined, Education has been provided regarding the importance of this vaccine but patient still declined. Advised may receive this vaccine at local pharmacy or Health Dept.or vaccine clinic. Aware to provide a copy of the vaccination record if obtained from local pharmacy or Health Dept. Verbalized acceptance and understanding.  Qualifies for Shingles Vaccine? Yes   Zostavax completed No   Shingrix Completed?: No.    Education has been provided regarding the importance of this vaccine. Patient has been advised to call insurance company to determine out of pocket expense  if they have not yet  received this vaccine. Advised may also receive vaccine at local pharmacy or Health Dept. Verbalized acceptance and understanding.  Screening Tests Health Maintenance  Topic Date Due   COVID-19 Vaccine (1) Never done   Zoster Vaccines- Shingrix (1 of 2) Never done   DEXA SCAN  Never done   INFLUENZA VACCINE  08/24/2020   TETANUS/TDAP  12/23/2030   Pneumonia Vaccine 21+ Years old  Completed   HPV VACCINES  Aged Out    Health Maintenance  Health Maintenance Due  Topic Date Due   COVID-19 Vaccine (1) Never done   Zoster Vaccines- Shingrix (1 of 2) Never done   DEXA SCAN  Never done   INFLUENZA VACCINE  08/24/2020    Colorectal cancer screening: No longer required.   Mammogram status: No longer required due to age/patient refusal.  Bone density status: never done  Lung Cancer Screening: (Low Dose CT Chest recommended if Age 50-80 years, 30 pack-year currently smoking OR have quit w/in 15years.) does not qualify.   Lung Cancer Screening Referral: no  Additional Screening:  Hepatitis C Screening: does not qualify; Completed no  Vision Screening: Recommended annual ophthalmology exams for early detection of glaucoma and other disorders of the eye. Is the patient up to date with their annual eye exam?  Yes  Who is the provider or what is the name of the office in which the patient attends annual eye exams? [facility] If pt is not established with a provider, would they like to be referred to a provider to establish care? No .   Dental Screening: Recommended annual dental exams for proper oral hygiene  Community Resource Referral / Chronic Care Management: CRR required this visit?  No   CCM required this visit?  No      Plan:     I have personally reviewed and noted the following in the patients chart:   Medical and social history Use of alcohol, tobacco or illicit drugs  Current medications and supplements including opioid prescriptions.   Functional ability and status Nutritional status Physical activity Advanced directives List of other physicians Hospitalizations, surgeries, and ER visits in previous 12 months Vitals Screenings to include cognitive, depression, and falls Referrals and appointments  In addition, I have reviewed and discussed with patient certain preventive protocols, quality metrics, and best practice recommendations. A written personalized care plan for preventive services as well as general preventive health recommendations were provided to patient.     Sheral Flow, LPN   09/26/2669   Nurse Notes:  Hearing Screening - Comments:: Patient denied any hearing difficulty.   No hearing aids.  Vision Screening - Comments:: Patient wears corrective glasses/contacts.  Eye exam done annually by: [facility]

## 2021-03-27 DIAGNOSIS — Z0100 Encounter for examination of eyes and vision without abnormal findings: Secondary | ICD-10-CM | POA: Diagnosis not present

## 2021-03-27 DIAGNOSIS — H43393 Other vitreous opacities, bilateral: Secondary | ICD-10-CM | POA: Diagnosis not present

## 2021-03-27 DIAGNOSIS — H2513 Age-related nuclear cataract, bilateral: Secondary | ICD-10-CM | POA: Diagnosis not present

## 2021-04-16 DIAGNOSIS — D649 Anemia, unspecified: Secondary | ICD-10-CM | POA: Diagnosis not present

## 2021-04-16 DIAGNOSIS — E7849 Other hyperlipidemia: Secondary | ICD-10-CM | POA: Diagnosis not present

## 2021-04-16 DIAGNOSIS — E876 Hypokalemia: Secondary | ICD-10-CM | POA: Diagnosis not present

## 2021-04-16 DIAGNOSIS — Z79899 Other long term (current) drug therapy: Secondary | ICD-10-CM | POA: Diagnosis not present

## 2021-05-18 ENCOUNTER — Ambulatory Visit: Payer: Medicare HMO | Admitting: Internal Medicine

## 2021-05-21 ENCOUNTER — Encounter: Payer: Self-pay | Admitting: Internal Medicine

## 2021-05-21 ENCOUNTER — Ambulatory Visit (INDEPENDENT_AMBULATORY_CARE_PROVIDER_SITE_OTHER): Payer: Medicare HMO | Admitting: Internal Medicine

## 2021-05-21 VITALS — BP 128/90 | HR 90 | Resp 18 | Ht 61.0 in | Wt 121.4 lb

## 2021-05-21 DIAGNOSIS — F411 Generalized anxiety disorder: Secondary | ICD-10-CM | POA: Diagnosis not present

## 2021-05-21 DIAGNOSIS — R69 Illness, unspecified: Secondary | ICD-10-CM | POA: Diagnosis not present

## 2021-05-21 DIAGNOSIS — J0110 Acute frontal sinusitis, unspecified: Secondary | ICD-10-CM | POA: Insufficient documentation

## 2021-05-21 DIAGNOSIS — G43811 Other migraine, intractable, with status migrainosus: Secondary | ICD-10-CM

## 2021-05-21 DIAGNOSIS — G894 Chronic pain syndrome: Secondary | ICD-10-CM

## 2021-05-21 MED ORDER — DULOXETINE HCL 60 MG PO CPEP: 60.0000 mg | ORAL_CAPSULE | Freq: Every day | ORAL | 1 refills | Status: DC

## 2021-05-21 MED ORDER — PREDNISONE 50 MG PO TABS: 50.0000 mg | ORAL_TABLET | Freq: Every day | ORAL | 0 refills | Status: AC

## 2021-05-21 MED ORDER — AMOXICILLIN-POT CLAVULANATE 875-125 MG PO TABS: 1.0000 | ORAL_TABLET | Freq: Two times a day (BID) | ORAL | 0 refills | Status: DC

## 2021-05-21 MED ORDER — METHYLPREDNISOLONE ACETATE 40 MG/ML IJ SUSP
40.0000 mg | Freq: Once | INTRAMUSCULAR | Status: AC
  Administered 2021-05-21: 40 mg via INTRAMUSCULAR

## 2021-05-21 MED ORDER — SUMATRIPTAN SUCCINATE 50 MG PO TABS: 50.0000 mg | ORAL_TABLET | ORAL | 0 refills | Status: DC | PRN

## 2021-05-21 MED ORDER — KETOROLAC TROMETHAMINE 30 MG/ML IJ SOLN
30.0000 mg | Freq: Once | INTRAMUSCULAR | Status: AC
  Administered 2021-05-21: 30 mg via INTRAMUSCULAR

## 2021-05-21 NOTE — Assessment & Plan Note (Signed)
Given toradol 30 mg IM and depo-medrol 40 mg IM today at visit. Rx sumatriptan 50 mg to use for migraines as needed. Suspect her opioid withdrawal could be contributing to her headache for the last 2-3 weeks since she has run out of medicine entirely.  ?

## 2021-05-21 NOTE — Progress Notes (Signed)
? ?  Subjective:  ? ?Patient ID: DAYNA ALIA, female    DOB: 1935-03-02, 86 y.o.   MRN: [id number] ? ?HPI ?The patient is an 86 YO female coming in for multiple issues. ? ?Review of Systems  ?Constitutional:  Positive for activity change and fatigue.  ?HENT:  Positive for congestion, postnasal drip, rhinorrhea, sinus pressure and sinus pain.   ?Eyes: Negative.   ?Respiratory:  Negative for cough, chest tightness and shortness of breath.   ?Cardiovascular:  Negative for chest pain, palpitations and leg swelling.  ?Gastrointestinal:  Negative for abdominal distention, abdominal pain, constipation, diarrhea, nausea and vomiting.  ?Musculoskeletal:  Positive for arthralgias, back pain and myalgias.  ?Skin: Negative.   ?Neurological: Negative.   ?Psychiatric/Behavioral:  Positive for decreased concentration, dysphoric mood and sleep disturbance. The patient is nervous/anxious.   ? ?Objective:  ?Physical Exam ?Constitutional:   ?   Appearance: She is well-developed.  ?HENT:  ?   Head: Normocephalic and atraumatic.  ?   Comments: Oropharynx with redness and clear drainage, nose with swollen turbinates, TMs normal bilaterally.  ?Neck:  ?   Thyroid: No thyromegaly.  ?Cardiovascular:  ?   Rate and Rhythm: Normal rate and regular rhythm.  ?Pulmonary:  ?   Effort: Pulmonary effort is normal. No respiratory distress.  ?   Breath sounds: Normal breath sounds. No wheezing or rales.  ?Abdominal:  ?   General: Bowel sounds are normal. There is no distension.  ?   Palpations: Abdomen is soft.  ?   Tenderness: There is no abdominal tenderness. There is no rebound.  ?Musculoskeletal:     ?   General: Tenderness present.  ?   Cervical back: Normal range of motion.  ?Lymphadenopathy:  ?   Cervical: No cervical adenopathy.  ?Skin: ?   General: Skin is warm and dry.  ?Neurological:  ?   Mental Status: She is alert and oriented to person, place, and time.  ?   Coordination: Coordination normal.  ? ? ?Vitals:  ? 05/21/21 1406  ?BP: 128/90   ?Pulse: 90  ?Resp: 18  ?SpO2: 97%  ?Weight: 121 lb 6.4 oz (55.1 kg)  ?Height: '5\' 1"'$  (1.549 m)  ? ?This visit occurred during the SARS-CoV-2 public health emergency.  Safety protocols were in place, including screening questions prior to the visit, additional usage of staff PPE, and extensive cleaning of exam room while observing appropriate contact time as indicated for disinfecting solutions.  ? ?Assessment & Plan:  ?Toradol 30 mg IM given depo-medrol 40 mg IM given at visit ?

## 2021-05-21 NOTE — Assessment & Plan Note (Signed)
She wishes a morning medicine to help with anxiety and energy. Rx duloxetine 60 mg daily and hopefully this will help with her chronic pain as well since she does not have her medication currently. Suspect some of her mood disturbance is related to opioid withdrawal.  ?

## 2021-05-21 NOTE — Assessment & Plan Note (Signed)
She has been seeing her orthopedic doctor for pain management and last saw them 09/2020. She did not follow up as they require and so she was unable to get her pain medicine refilled. She last filled this Jan 2023. We are unable to prescribe this as she has contract with them. She is scheduled for July. Asked her to call back to see if they could see her sooner. She had been on hydrocodone/apap 10/325 #120 per month for some years and now is off altogether. I suspect she is still in withdrawal from that and it is causing mood effects.  ?

## 2021-05-21 NOTE — Patient Instructions (Addendum)
We have sent in duloxetine to take in the morning to help with the mood and anxiety. ? ?We will treat the sinus problem with prednisone to take 1 pill daily for 1 week. We have also sent in an antibiotic called augmentin to take 1 pill twice a day for 10 days. ? ?We have done the two shots today to help with the migraine and have sent in a medicine to use for migraines as needed called sumatriptan to take for headache. ?

## 2021-05-21 NOTE — Assessment & Plan Note (Signed)
Rx prednisone 1 week and augmentin 10 day course for sinus infection going on 2-3 weeks now without relief.  ?

## 2021-06-07 ENCOUNTER — Telehealth: Payer: Self-pay

## 2021-06-07 NOTE — Telephone Encounter (Signed)
Pt states she is having a really bad migraine still and nose is running. Pt states she would something to be sent in as she was recently tx for a sinus infection and is still having discomfort. ?

## 2021-06-08 NOTE — Telephone Encounter (Signed)
We will need more information about if the treatments helped, how have symptoms changed over the last 2-3 weeks, etc. What medications or otc allergy medicines is she taking currently? ?

## 2021-06-10 NOTE — Telephone Encounter (Signed)
Called pt. LVM at [phone] to discuss what medications have been taken. Office number was provided.[name]

## 2021-07-14 ENCOUNTER — Ambulatory Visit: Payer: Medicare HMO | Admitting: Internal Medicine

## 2021-07-16 ENCOUNTER — Other Ambulatory Visit (INDEPENDENT_AMBULATORY_CARE_PROVIDER_SITE_OTHER): Payer: Medicare HMO

## 2021-07-16 ENCOUNTER — Encounter: Payer: Self-pay | Admitting: Internal Medicine

## 2021-07-16 ENCOUNTER — Ambulatory Visit (INDEPENDENT_AMBULATORY_CARE_PROVIDER_SITE_OTHER): Payer: Medicare HMO | Admitting: Internal Medicine

## 2021-07-16 VITALS — BP 108/70 | HR 56 | Resp 18 | Ht 61.0 in | Wt 111.4 lb

## 2021-07-16 DIAGNOSIS — E538 Deficiency of other specified B group vitamins: Secondary | ICD-10-CM

## 2021-07-16 DIAGNOSIS — F1193 Opioid use, unspecified with withdrawal: Secondary | ICD-10-CM

## 2021-07-16 DIAGNOSIS — R5383 Other fatigue: Secondary | ICD-10-CM

## 2021-07-16 DIAGNOSIS — I10 Essential (primary) hypertension: Secondary | ICD-10-CM | POA: Diagnosis not present

## 2021-07-16 DIAGNOSIS — F411 Generalized anxiety disorder: Secondary | ICD-10-CM

## 2021-07-16 DIAGNOSIS — R69 Illness, unspecified: Secondary | ICD-10-CM | POA: Diagnosis not present

## 2021-07-16 LAB — CBC
HCT: 41.1 % (ref 36.0–46.0)
Hemoglobin: 13.8 g/dL (ref 12.0–15.0)
MCHC: 33.7 g/dL (ref 30.0–36.0)
MCV: 93 fl (ref 78.0–100.0)
Platelets: 212 10*3/uL (ref 150.0–400.0)
RBC: 4.42 Mil/uL (ref 3.87–5.11)
RDW: 15.8 % — ABNORMAL HIGH (ref 11.5–15.5)
WBC: 6.3 10*3/uL (ref 4.0–10.5)

## 2021-07-16 LAB — COMPREHENSIVE METABOLIC PANEL
ALT: 8 U/L (ref 0–35)
AST: 13 U/L (ref 0–37)
Albumin: 3.6 g/dL (ref 3.5–5.2)
Alkaline Phosphatase: 82 U/L (ref 39–117)
BUN: 9 mg/dL (ref 6–23)
CO2: 31 mEq/L (ref 19–32)
Calcium: 9.2 mg/dL (ref 8.4–10.5)
Chloride: 101 mEq/L (ref 96–112)
Creatinine, Ser: 0.87 mg/dL (ref 0.40–1.20)
GFR: 61.55 mL/min (ref >60.00)
Glucose, Bld: 121 mg/dL — ABNORMAL HIGH (ref 70–99)
Potassium: 3.3 mEq/L — ABNORMAL LOW (ref 3.5–5.1)
Sodium: 143 mEq/L (ref 135–145)
Total Bilirubin: 0.9 mg/dL (ref 0.2–1.2)
Total Protein: 7.2 g/dL (ref 6.0–8.3)

## 2021-07-16 LAB — LIPID PANEL
Cholesterol: 206 mg/dL — ABNORMAL HIGH (ref 0–200)
HDL: 36.8 mg/dL — ABNORMAL LOW (ref >39.00)
LDL Cholesterol: 136 mg/dL — ABNORMAL HIGH (ref 0–99)
NonHDL: 168.9
Total CHOL/HDL Ratio: 6
Triglycerides: 167 mg/dL — ABNORMAL HIGH (ref 0.0–149.0)
VLDL: 33.4 mg/dL (ref 0.0–40.0)

## 2021-07-16 LAB — HEMOGLOBIN A1C: Hgb A1c MFr Bld: 6 % (ref 4.6–6.5)

## 2021-07-16 LAB — VITAMIN B12: Vitamin B-12: 282 pg/mL (ref 211–911)

## 2021-07-16 LAB — VITAMIN D 25 HYDROXY (VIT D DEFICIENCY, FRACTURES): VITD: 10.1 ng/mL — ABNORMAL LOW (ref 30.00–100.00)

## 2021-07-16 LAB — TSH: TSH: 0.27 u[IU]/mL — ABNORMAL LOW (ref 0.35–5.50)

## 2021-07-16 MED ORDER — MIRTAZAPINE 7.5 MG PO TABS: 7.5000 mg | ORAL_TABLET | Freq: Every day | ORAL | 1 refills | Status: DC

## 2021-07-16 MED ORDER — FLUTICASONE PROPIONATE 50 MCG/ACT NA SUSP: 2.0000 | Freq: Every day | NASAL | 6 refills | Status: DC

## 2021-07-16 NOTE — Assessment & Plan Note (Signed)
Checking B12 levels. 

## 2021-07-20 ENCOUNTER — Other Ambulatory Visit: Payer: Self-pay | Admitting: Internal Medicine

## 2021-07-20 MED ORDER — VITAMIN D (ERGOCALCIFEROL) 1.25 MG (50000 UNIT) PO CAPS: 50000.0000 [IU] | ORAL_CAPSULE | ORAL | 0 refills | Status: DC

## 2021-07-27 ENCOUNTER — Emergency Department (HOSPITAL_COMMUNITY): Payer: Medicare HMO

## 2021-07-27 ENCOUNTER — Inpatient Hospital Stay (HOSPITAL_COMMUNITY)
Admission: EM | Admit: 2021-07-27 | Discharge: 2021-07-31 | DRG: 147 | Disposition: A | Discharge status: Hospice | Payer: Medicare HMO | Attending: Internal Medicine | Admitting: Internal Medicine

## 2021-07-27 ENCOUNTER — Other Ambulatory Visit: Payer: Self-pay

## 2021-07-27 ENCOUNTER — Encounter (HOSPITAL_COMMUNITY): Payer: Self-pay

## 2021-07-27 DIAGNOSIS — C8590 Non-Hodgkin lymphoma, unspecified, unspecified site: Secondary | ICD-10-CM | POA: Diagnosis present

## 2021-07-27 DIAGNOSIS — R64 Cachexia: Secondary | ICD-10-CM | POA: Diagnosis present

## 2021-07-27 DIAGNOSIS — Z7982 Long term (current) use of aspirin: Secondary | ICD-10-CM

## 2021-07-27 DIAGNOSIS — Z79899 Other long term (current) drug therapy: Secondary | ICD-10-CM

## 2021-07-27 DIAGNOSIS — R918 Other nonspecific abnormal finding of lung field: Secondary | ICD-10-CM | POA: Diagnosis present

## 2021-07-27 DIAGNOSIS — D1430 Benign neoplasm of unspecified bronchus and lung: Secondary | ICD-10-CM | POA: Diagnosis not present

## 2021-07-27 DIAGNOSIS — I2510 Atherosclerotic heart disease of native coronary artery without angina pectoris: Secondary | ICD-10-CM | POA: Diagnosis present

## 2021-07-27 DIAGNOSIS — Z515 Encounter for palliative care: Secondary | ICD-10-CM

## 2021-07-27 DIAGNOSIS — Z66 Do not resuscitate: Secondary | ICD-10-CM | POA: Diagnosis not present

## 2021-07-27 DIAGNOSIS — D492 Neoplasm of unspecified behavior of bone, soft tissue, and skin: Secondary | ICD-10-CM | POA: Diagnosis not present

## 2021-07-27 DIAGNOSIS — F411 Generalized anxiety disorder: Secondary | ICD-10-CM | POA: Diagnosis present

## 2021-07-27 DIAGNOSIS — I10 Essential (primary) hypertension: Secondary | ICD-10-CM | POA: Diagnosis present

## 2021-07-27 DIAGNOSIS — R9431 Abnormal electrocardiogram [ECG] [EKG]: Secondary | ICD-10-CM | POA: Diagnosis not present

## 2021-07-27 DIAGNOSIS — Z823 Family history of stroke: Secondary | ICD-10-CM

## 2021-07-27 DIAGNOSIS — D472 Monoclonal gammopathy: Secondary | ICD-10-CM | POA: Diagnosis present

## 2021-07-27 DIAGNOSIS — Z6820 Body mass index (BMI) 20.0-20.9, adult: Secondary | ICD-10-CM

## 2021-07-27 DIAGNOSIS — J3489 Other specified disorders of nose and nasal sinuses: Principal | ICD-10-CM

## 2021-07-27 DIAGNOSIS — R531 Weakness: Secondary | ICD-10-CM

## 2021-07-27 DIAGNOSIS — E876 Hypokalemia: Secondary | ICD-10-CM | POA: Diagnosis present

## 2021-07-27 DIAGNOSIS — C300 Malignant neoplasm of nasal cavity: Secondary | ICD-10-CM | POA: Diagnosis not present

## 2021-07-27 DIAGNOSIS — K219 Gastro-esophageal reflux disease without esophagitis: Secondary | ICD-10-CM | POA: Diagnosis present

## 2021-07-27 DIAGNOSIS — C7802 Secondary malignant neoplasm of left lung: Secondary | ICD-10-CM | POA: Diagnosis present

## 2021-07-27 DIAGNOSIS — F17210 Nicotine dependence, cigarettes, uncomplicated: Secondary | ICD-10-CM | POA: Diagnosis present

## 2021-07-27 DIAGNOSIS — F419 Anxiety disorder, unspecified: Secondary | ICD-10-CM | POA: Diagnosis present

## 2021-07-27 DIAGNOSIS — Z841 Family history of disorders of kidney and ureter: Secondary | ICD-10-CM

## 2021-07-27 DIAGNOSIS — Z7401 Bed confinement status: Secondary | ICD-10-CM

## 2021-07-27 DIAGNOSIS — E785 Hyperlipidemia, unspecified: Secondary | ICD-10-CM | POA: Diagnosis present

## 2021-07-27 DIAGNOSIS — Z955 Presence of coronary angioplasty implant and graft: Secondary | ICD-10-CM

## 2021-07-27 DIAGNOSIS — R54 Age-related physical debility: Secondary | ICD-10-CM | POA: Diagnosis present

## 2021-07-27 DIAGNOSIS — R220 Localized swelling, mass and lump, head: Secondary | ICD-10-CM | POA: Diagnosis not present

## 2021-07-27 DIAGNOSIS — M1990 Unspecified osteoarthritis, unspecified site: Secondary | ICD-10-CM | POA: Diagnosis present

## 2021-07-27 DIAGNOSIS — Z720 Tobacco use: Secondary | ICD-10-CM | POA: Diagnosis present

## 2021-07-27 DIAGNOSIS — G9340 Encephalopathy, unspecified: Secondary | ICD-10-CM | POA: Diagnosis not present

## 2021-07-27 LAB — COMPREHENSIVE METABOLIC PANEL
ALT: 11 U/L (ref 0–44)
AST: 16 U/L (ref 15–41)
Albumin: 3 g/dL — ABNORMAL LOW (ref 3.5–5.0)
Alkaline Phosphatase: 70 U/L (ref 38–126)
Anion gap: 10 (ref 5–15)
BUN: 12 mg/dL (ref 8–23)
CO2: 28 mmol/L (ref 22–32)
Calcium: 8.4 mg/dL — ABNORMAL LOW (ref 8.9–10.3)
Chloride: 102 mmol/L (ref 98–111)
Creatinine, Ser: 0.84 mg/dL (ref 0.44–1.00)
GFR, Estimated: >60 mL/min (ref >60)
Glucose, Bld: 100 mg/dL — ABNORMAL HIGH (ref 70–99)
Potassium: 2.8 mmol/L — ABNORMAL LOW (ref 3.5–5.1)
Sodium: 140 mmol/L (ref 135–145)
Total Bilirubin: 0.6 mg/dL (ref 0.3–1.2)
Total Protein: 6.8 g/dL (ref 6.5–8.1)

## 2021-07-27 LAB — URINALYSIS, ROUTINE W REFLEX MICROSCOPIC
Bilirubin Urine: NEGATIVE
Glucose, UA: NEGATIVE mg/dL
Ketones, ur: NEGATIVE mg/dL
Leukocytes,Ua: NEGATIVE
Nitrite: NEGATIVE
Protein, ur: NEGATIVE mg/dL
Specific Gravity, Urine: 1.004 — ABNORMAL LOW (ref 1.005–1.030)
pH: 6 (ref 5.0–8.0)

## 2021-07-27 LAB — CBC
HCT: 34.7 % — ABNORMAL LOW (ref 36.0–46.0)
Hemoglobin: 11.7 g/dL — ABNORMAL LOW (ref 12.0–15.0)
MCH: 31.4 pg (ref 26.0–34.0)
MCHC: 33.7 g/dL (ref 30.0–36.0)
MCV: 93 fL (ref 80.0–100.0)
Platelets: 261 10*3/uL (ref 150–400)
RBC: 3.73 MIL/uL — ABNORMAL LOW (ref 3.87–5.11)
RDW: 15.4 % (ref 11.5–15.5)
WBC: 7.5 10*3/uL (ref 4.0–10.5)
nRBC: 0 % (ref 0.0–0.2)

## 2021-07-27 LAB — MAGNESIUM: Magnesium: 1.6 mg/dL — ABNORMAL LOW (ref 1.7–2.4)

## 2021-07-27 MED ORDER — LORAZEPAM 2 MG/ML IJ SOLN
1.0000 mg | Freq: Once | INTRAMUSCULAR | Status: AC | PRN
Start: 2021-07-27 — End: 2021-07-27
  Administered 2021-07-27: 1 mg via INTRAVENOUS
  Filled 2021-07-27: qty 1

## 2021-07-27 MED ORDER — POTASSIUM CHLORIDE CRYS ER 20 MEQ PO TBCR
40.0000 meq | EXTENDED_RELEASE_TABLET | Freq: Once | ORAL | Status: DC
  Filled 2021-07-27: qty 2

## 2021-07-27 MED ORDER — GADOBUTROL 1 MMOL/ML IV SOLN
5.0000 mL | Freq: Once | INTRAVENOUS | Status: AC | PRN
  Administered 2021-07-28: 5 mL via INTRAVENOUS

## 2021-07-27 MED ORDER — IOHEXOL 300 MG/ML  SOLN: 100.0000 mL | Freq: Once | INTRAMUSCULAR | Status: DC | PRN

## 2021-07-27 MED ORDER — POTASSIUM CHLORIDE 10 MEQ/100ML IV SOLN
10.0000 meq | INTRAVENOUS | Status: AC
  Administered 2021-07-28 (×2): 10 meq via INTRAVENOUS
  Filled 2021-07-27 (×2): qty 100

## 2021-07-27 MED ORDER — MAGNESIUM SULFATE 2 GM/50ML IV SOLN
2.0000 g | Freq: Once | INTRAVENOUS | Status: AC
  Administered 2021-07-28: 2 g via INTRAVENOUS
  Filled 2021-07-27: qty 50

## 2021-07-27 NOTE — ED Notes (Signed)
Patient transported to MRI 

## 2021-07-27 NOTE — ED Triage Notes (Signed)
Pt to ED pov from home.  Pt c/o weakness, falling x2, losing weight, intermittent nosebleeds, headache, left eye swelling and yellowish discharge, nasal congestion and right leg pain. Pt fell last night after losing her balance. Pt denies taking blood thinners.

## 2021-07-27 NOTE — ED Provider Notes (Incomplete)
[facility] EMERGENCY DEPARTMENT Provider Note   CSN: [id number] Arrival date & time: 07/27/21  2028     History {Add pertinent medical, surgical, social history, OB history to HPI:1} Chief Complaint  Patient presents with  . Weakness    [name] is a 86 y.o. female.  [name] is a 86 y.o. female with hx of lymphoma in remission, CAD, HTN, hyperlipidemia, GERD, who presents accompanied by her granddaughter and son for evaluation headaches, nasal congestion, falls and generalized weakness.  Patient reports for the past few weeks she has been having more frequent headaches and has got a lot of nasal congestion.  She saw her PCP for the congestion and was prescribed a nasal spray without improvement.  Reports a few nights ago she had a nosebleed that resolved with pressure.  She also reports that she has been feeling more unsteady and has had more frequent falls.  She reports that she fell in her bedroom last night trying to make her bed when she lost her balance but denies any injuries from the falls.  Patient reports unintentional weight loss over the past several months.  Granddaughter reports that she has seemed more confused and is not acting like herself and seems to intermittently be having intense dreams or hallucinations where she thinks someone is in the room that is not there with her.  She reports that her grandmother used to help take care of her great granddaughter but seems to be spending most of her time in her bedroom and not caring for herself like usual.  The history is provided by the patient and a relative.       Home Medications Prior to Admission medications   Medication Sig Start Date End Date Taking? Authorizing Provider  amoxicillin-clavulanate (AUGMENTIN) 875-125 MG tablet Take 1 tablet by mouth 2 (two) times daily. 05/21/21   Hoyt Koch, MD  aspirin 81 MG tablet Take 81 mg by mouth daily.      [provider]   busPIRone (BUSPAR) 5 MG tablet TAKE 1 TABLET(5 MG) BY MOUTH TWICE DAILY 10/02/20   Hoyt Koch, MD  DULoxetine (CYMBALTA) 60 MG capsule Take 1 capsule (60 mg total) by mouth daily. 05/21/21   Hoyt Koch, MD  famotidine (PEPCID) 20 MG tablet Take 20 mg by mouth 2 (two) times daily.    [provider]  fluticasone (FLONASE) 50 MCG/ACT nasal spray Place 2 sprays into both nostrils daily. 07/16/21   Hoyt Koch, MD  HYDROcodone-acetaminophen (NORCO) 10-325 MG tablet Take 1 tablet by mouth every 6 (six) hours as needed.    [provider]  losartan (COZAAR) 100 MG tablet Take 100 mg by mouth daily. 12/30/19   [provider]  metoprolol succinate (TOPROL-XL) 25 MG 24 hr tablet 25 mg daily.    [provider]  mirtazapine (REMERON) 7.5 MG tablet Take 1 tablet (7.5 mg total) by mouth at bedtime. 07/16/21   Hoyt Koch, MD  nitroGLYCERIN (NITROLINGUAL) 0.4 MG/SPRAY spray Place 1 spray under the tongue every 5 (five) minutes x 3 doses as needed for chest pain.    [provider]  SUMAtriptan (IMITREX) 50 MG tablet Take 1 tablet (50 mg total) by mouth every 2 (two) hours as needed for migraine. May repeat in 2 hours if headache persists or recurs. 05/21/21   Hoyt Koch, MD  Vitamin D, Ergocalciferol, (DRISDOL) 1.25 MG (50000 UNIT) CAPS capsule Take 1 capsule (50,000 Units  total) by mouth every 7 (seven) days. 07/20/21   Hoyt Koch, MD      Allergies    Patient has no known allergies.    Review of Systems   Review of Systems  Neurological:  Positive for weakness.    Physical Exam Updated Vital Signs BP (!) 109/96 (BP Location: Right Arm)   Pulse (!) 102   Temp 98.2 F (36.8 C) (Oral)   Resp 16   Ht '5\' 1"'$  (1.549 m)   Wt 50.3 kg   SpO2 100%   BMI 20.97 kg/m  Physical Exam  ED Results / Procedures / Treatments   Labs (all labs ordered are listed, but only abnormal results are displayed) Labs  Reviewed  COMPREHENSIVE METABOLIC PANEL - Abnormal; Notable for the following components:      Result Value   Potassium 2.8 (*)    Glucose, Bld 100 (*)    Calcium 8.4 (*)    Albumin 3.0 (*)    All other components within normal limits  CBC - Abnormal; Notable for the following components:   RBC 3.73 (*)    Hemoglobin 11.7 (*)    HCT 34.7 (*)    All other components within normal limits  URINALYSIS, ROUTINE W REFLEX MICROSCOPIC - Abnormal; Notable for the following components:   Color, Urine STRAW (*)    Specific Gravity, Urine 1.004 (*)    Hgb urine dipstick SMALL (*)    Bacteria, UA RARE (*)    All other components within normal limits  MAGNESIUM - Abnormal; Notable for the following components:   Magnesium 1.6 (*)    All other components within normal limits  CBG MONITORING, ED    EKG None  Radiology CT Head Wo Contrast  Result Date: 07/27/2021 CLINICAL DATA:  Encephalopathy EXAM: CT HEAD WITHOUT CONTRAST TECHNIQUE: Contiguous axial images were obtained from the base of the skull through the vertex without intravenous contrast. RADIATION DOSE REDUCTION: This exam was performed according to the departmental dose-optimization program which includes automated exposure control, adjustment of the mA and/or kV according to patient size and/or use of iterative reconstruction technique. COMPARISON:  None Available. FINDINGS: Brain: No acute hemorrhage. There is periventricular hypoattenuation compatible with chronic microvascular disease. Old right basal ganglia small vessel infarct. There is a mass of the sella turcica and anterior skull base that measures 4.0 cm AP x 3.1 cm TV x 3.4 cm CC . the mass extends through the floor of the anterior cranial fossa into the ethmoid sinus. Vascular: No abnormal hyperdensity of the major intracranial arteries or dural venous sinuses. No intracranial atherosclerosis. Skull: The visualized skull base, calvarium and extracranial soft tissues are normal.  Sinuses/Orbits: Tumor extension into the paranasal sinuses, as mentioned above. The orbits are normal. IMPRESSION: 1. No acute intracranial hemorrhage. 2. 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 Primary considerations include macroadenoma, metastatic disease or aggressive meningioma. MRI with and without contrast is recommended for better characterization. Electronically Signed   By: Ulyses Jarred M.D.   On: 07/27/2021 21:47    Procedures Procedures  {Document cardiac monitor, telemetry assessment procedure when appropriate:1}  Medications Ordered in ED Medications  magnesium sulfate IVPB 2 g 50 mL (has no administration in time range)  potassium chloride 10 mEq in 100 mL IVPB (has no administration in time range)  potassium chloride SA (KLOR-CON M) CR tablet 40 mEq (has no administration in time  range)  LORazepam (ATIVAN) injection 1 mg (has no administration in time range)  iohexol (OMNIPAQUE) 300 MG/ML solution 100 mL (has no administration in time range)    ED Course/ Medical Decision Making/ A&P                           Medical Decision Making  ***  {Document critical care time when appropriate:1} {Document review of labs and clinical decision tools ie heart score, Chads2Vasc2 etc:1}  {Document your independent review of radiology images, and any outside records:1} {Document your discussion with family members, caretakers, and with consultants:1} {Document social determinants of health affecting pt's care:1} {Document your decision making why or why not admission, treatments were needed:1} Final Clinical Impression(s) / ED Diagnoses Final diagnoses:  None    Rx / DC Orders ED Discharge Orders     None

## 2021-07-27 NOTE — ED Provider Notes (Signed)
[facility] EMERGENCY DEPARTMENT Provider Note   CSN: [id number] Arrival date & time: 07/27/21  2028     History  Chief Complaint  Patient presents with   Weakness    [name] is a 86 y.o. female.  [name] is a 86 y.o. female with hx of lymphoma in remission, CAD, HTN, hyperlipidemia, GERD, who presents accompanied by her granddaughter and son for evaluation headaches, nasal congestion, falls and generalized weakness.  Patient reports for the past few weeks she has been having more frequent headaches and has got a lot of nasal congestion.  She saw her PCP for the congestion and was prescribed a nasal spray without improvement.  Reports a few nights ago she had a nosebleed that resolved with pressure.  She also reports that she has been feeling more unsteady and has had more frequent falls.  She reports that she fell in her bedroom last night trying to make her bed when she lost her balance but denies any injuries from the falls.  Patient reports unintentional weight loss over the past several months.  Granddaughter reports that she has seemed more confused and is not acting like herself and seems to intermittently be having intense dreams or hallucinations where she thinks someone is in the room that is not there with her.  She reports that her grandmother used to help take care of her great granddaughter but seems to be spending most of her time in her bedroom and not caring for herself like usual.  The history is provided by the patient and a relative.       Home Medications Prior to Admission medications   Medication Sig Start Date End Date Taking? Authorizing Provider  amoxicillin-clavulanate (AUGMENTIN) 875-125 MG tablet Take 1 tablet by mouth 2 (two) times daily. 05/21/21   Hoyt Koch, MD  aspirin 81 MG tablet Take 81 mg by mouth daily.      [provider]  busPIRone (BUSPAR) 5 MG tablet TAKE 1 TABLET(5 MG) BY MOUTH TWICE DAILY  10/02/20   Hoyt Koch, MD  DULoxetine (CYMBALTA) 60 MG capsule Take 1 capsule (60 mg total) by mouth daily. 05/21/21   Hoyt Koch, MD  famotidine (PEPCID) 20 MG tablet Take 20 mg by mouth 2 (two) times daily.    [provider]  fluticasone (FLONASE) 50 MCG/ACT nasal spray Place 2 sprays into both nostrils daily. 07/16/21   Hoyt Koch, MD  HYDROcodone-acetaminophen (NORCO) 10-325 MG tablet Take 1 tablet by mouth every 6 (six) hours as needed.    [provider]  losartan (COZAAR) 100 MG tablet Take 100 mg by mouth daily. 12/30/19   [provider]  metoprolol succinate (TOPROL-XL) 25 MG 24 hr tablet 25 mg daily.    [provider]  mirtazapine (REMERON) 7.5 MG tablet Take 1 tablet (7.5 mg total) by mouth at bedtime. 07/16/21   Hoyt Koch, MD  nitroGLYCERIN (NITROLINGUAL) 0.4 MG/SPRAY spray Place 1 spray under the tongue every 5 (five) minutes x 3 doses as needed for chest pain.    [provider]  SUMAtriptan (IMITREX) 50 MG tablet Take 1 tablet (50 mg total) by mouth every 2 (two) hours as needed for migraine. May repeat in 2 hours if headache persists or recurs. 05/21/21   Hoyt Koch, MD  Vitamin D, Ergocalciferol, (DRISDOL) 1.25 MG (50000 UNIT) CAPS capsule Take 1 capsule (50,000 Units total) by mouth every 7 (seven) days. 07/20/21  Hoyt Koch, MD      Allergies    Patient has no known allergies.    Review of Systems   Review of Systems  Constitutional:  Positive for unexpected weight change. Negative for chills and fever.  HENT:  Positive for congestion, rhinorrhea and sinus pressure.   Respiratory:  Negative for cough and shortness of breath.   Cardiovascular:  Negative for chest pain.  Gastrointestinal:  Negative for abdominal pain, nausea and vomiting.  Genitourinary:  Negative for dysuria and frequency.  Musculoskeletal:  Negative for arthralgias and myalgias.  Neurological:   Positive for weakness and headaches.    Physical Exam Updated Vital Signs BP (!) 109/96 (BP Location: Right Arm)   Pulse (!) 102   Temp 98.2 F (36.8 C) (Oral)   Resp 16   Ht '5\' 1"'$  (1.549 m)   Wt 50.3 kg   SpO2 100%   BMI 20.97 kg/m  Physical Exam Vitals and nursing note reviewed.  Constitutional:      General: She is not in acute distress.    Appearance: Normal appearance. She is well-developed. She is not diaphoretic.     Comments: Elderly female, chronically ill-appearing but in no acute distress.  HENT:     Head: Normocephalic and atraumatic.     Nose: Congestion and rhinorrhea present.     Comments: Significant mucosal edema with clear present congestion, some sinus tenderness noted bilaterally.    Mouth/Throat:     Mouth: Mucous membranes are moist.     Pharynx: Oropharynx is clear.  Eyes:     General:        Right eye: No discharge.        Left eye: No discharge.     Extraocular Movements: Extraocular movements intact.     Pupils: Pupils are equal, round, and reactive to light.  Cardiovascular:     Rate and Rhythm: Normal rate and regular rhythm.     Pulses: Normal pulses.     Heart sounds: Normal heart sounds.  Pulmonary:     Effort: Pulmonary effort is normal. No respiratory distress.     Breath sounds: Normal breath sounds. No wheezing or rales.     Comments: Respirations equal and unlabored, patient able to speak in full sentences, lungs clear to auscultation bilaterally  Abdominal:     General: Bowel sounds are normal. There is no distension.     Palpations: Abdomen is soft. There is no mass.     Tenderness: There is no abdominal tenderness. There is no guarding.     Comments: Abdomen soft, nondistended, nontender to palpation in all quadrants without guarding or peritoneal signs  Musculoskeletal:        General: No deformity.     Cervical back: Neck supple.  Skin:    General: Skin is warm and dry.     Capillary Refill: Capillary refill takes less than  2 seconds.  Neurological:     Mental Status: She is alert and oriented to person, place, and time.     Coordination: Coordination normal.     Comments: Speech is clear, able to follow commands CN III-XII intact Normal strength in upper and lower extremities bilaterally including dorsiflexion and plantar flexion, strong and equal grip strength Sensation normal to light and sharp touch Moves extremities without ataxia, coordination intact No pronator drift  Psychiatric:        Mood and Affect: Mood normal.        Behavior: Behavior normal.  ED Results / Procedures / Treatments   Labs (all labs ordered are listed, but only abnormal results are displayed) Labs Reviewed  COMPREHENSIVE METABOLIC PANEL - Abnormal; Notable for the following components:      Result Value   Potassium 2.8 (*)    Glucose, Bld 100 (*)    Calcium 8.4 (*)    Albumin 3.0 (*)    All other components within normal limits  CBC - Abnormal; Notable for the following components:   RBC 3.73 (*)    Hemoglobin 11.7 (*)    HCT 34.7 (*)    All other components within normal limits  URINALYSIS, ROUTINE W REFLEX MICROSCOPIC - Abnormal; Notable for the following components:   Color, Urine STRAW (*)    Specific Gravity, Urine 1.004 (*)    Hgb urine dipstick SMALL (*)    Bacteria, UA RARE (*)    All other components within normal limits  MAGNESIUM - Abnormal; Notable for the following components:   Magnesium 1.6 (*)    All other components within normal limits  CBG MONITORING, ED    EKG None  Radiology CT CHEST ABDOMEN PELVIS W CONTRAST  Result Date: 07/28/2021 CLINICAL DATA:  Metastatic disease evaluation. EXAM: CT CHEST, ABDOMEN, AND PELVIS WITH CONTRAST TECHNIQUE: Multidetector CT imaging of the chest, abdomen and pelvis was performed following the standard protocol during bolus administration of intravenous contrast. RADIATION DOSE REDUCTION: This exam was performed according to the departmental  dose-optimization program which includes automated exposure control, adjustment of the mA and/or kV according to patient size and/or use of iterative reconstruction technique. CONTRAST:  <See Chart> OMNIPAQUE IOHEXOL 300 MG/ML SOLN, 170m OMNIPAQUE IOHEXOL 300 MG/ML SOLN COMPARISON:  September 12, 2012 FINDINGS: CT CHEST FINDINGS Cardiovascular: There is moderate severity calcification of the aortic arch, without evidence of aortic aneurysm. Normal heart size with marked severity coronary artery calcification. No pericardial effusion. Mediastinum/Nodes: No enlarged mediastinal, hilar, or axillary lymph nodes. Multiple subcentimeter cystic appearing areas are seen within the right and left lobes of the thyroid gland. The trachea and esophagus demonstrate no significant findings. Lungs/Pleura: A 4.8 cm x 2.1 cm x 2.2 cm lobulated lung mass is seen within the posterior aspect of the left lower lobe. Mild lingular scarring and/or atelectasis is noted. There is no evidence of acute infiltrate, pleural effusion or pneumothorax. Musculoskeletal: Multilevel degenerative changes seen throughout the thoracic spine. CT ABDOMEN PELVIS FINDINGS Hepatobiliary: No focal liver abnormality is seen. No gallstones, gallbladder wall thickening, or biliary dilatation. Pancreas: Unremarkable. No pancreatic ductal dilatation or surrounding inflammatory changes. Spleen: Normal in size without focal abnormality. Adrenals/Urinary Tract: Adrenal glands are unremarkable. Kidneys are normal in size, without renal calculi or hydronephrosis. Multiple bilateral simple renal cysts are seen. The largest is located within the mid left kidney and measures approximately 5.8 cm x 6.7 cm. Bladder is unremarkable. Stomach/Bowel: Stomach is within normal limits. The appendix is not clearly identified. A very large amount of stool is seen throughout the colon. No evidence of bowel dilatation. There is stable diffuse circumferential thickening of the to sigmoid  junction. Vascular/Lymphatic: Aortic atherosclerosis. No enlarged abdominal or pelvic lymph nodes. Reproductive: Status post hysterectomy. No adnexal masses. Other: No abdominal wall hernia or abnormality. No abdominopelvic ascites. Musculoskeletal: Multilevel degenerative changes seen throughout the lumbar spine. IMPRESSION: 1. 4.8 cm x 2.1 cm x 2.2 cm lobulated left lower lobe mass consistent with a primary lung malignancy. 2. Multiple bilateral simple renal cysts. 3. Stable diffuse circumferential thickening of the to  sigmoid junction. Correlation with colonoscopy is recommended to exclude the presence of an underlying neoplastic process. Aortic Atherosclerosis (ICD10-I70.0). Electronically Signed   By: Virgina Norfolk M.D.   On: 07/28/2021 01:31   MR BRAIN W WO CONTRAST  Result Date: 07/28/2021 CLINICAL DATA:  New anterior skull base mass. Headache and nose bleeds. EXAM: MRI HEAD WITHOUT AND WITH CONTRAST TECHNIQUE: Multiplanar, multiecho pulse sequences of the brain and surrounding structures were obtained without and with intravenous contrast. CONTRAST:  13m GADAVIST GADOBUTROL 1 MMOL/ML IV SOLN COMPARISON:  None Available. FINDINGS: Brain: No acute infarct, mass effect or extra-axial collection. Fewer than 5 scattered microhemorrhages in a nonspecific pattern. There is multifocal hyperintense T2-weighted signal within the white matter. Generalized volume loss. Multiple old small vessel infarcts of the basal ganglia corona radiata. There is a large mass centered at the sinonasal cavity that erodes through most of the anterior skull base and measures 4.4 x 6.0 x 5.6 cm. There is bilateral cavernous sinus invasion (grade 3 on the right and grade 4 on the left). Vascular: Cavernous carotid flow voids are preserved. There is mild mass effect on the proximal optic nerves. The left optic nerve appears to be encased by the mass, but it is difficult to be certain due to motion. There is extension to the left  orbital apex and into both [place] and upper cervical spine: Destruction of most of the anterior skull base. Visualized upper cervical spine is normal. Sinuses/Orbits:Above-described mass fills much of the ethmoid space. There is opacification of all paranasal sinuses. The mass invades the left orbital apex. IMPRESSION: 1. Large mass centered in the nasal cavity extending into the paranasal sinuses and invading the intracranial compartment through the anterior skull base. 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 Other possibilities include esthesioneuroblastoma, sinonasal lymphoma or, less likely, pituitary macroadenoma. 2. Bilateral cavernous sinus invasion, left-greater-than-right, but the cavernous carotids remain patent. 3. Tumor extension to the left orbital apex with possible in case mint of a portion of the left optic nerve. Electronically Signed   By: KUlyses JarredM.D.   On: 07/28/2021 00:48   CT Head Wo Contrast  Result Date: 07/27/2021 CLINICAL DATA:  Encephalopathy EXAM: CT HEAD WITHOUT CONTRAST TECHNIQUE: Contiguous axial images were obtained from the base of the skull through the vertex without intravenous contrast. RADIATION DOSE REDUCTION: This exam was performed according to the departmental dose-optimization program which includes automated exposure control, adjustment of the mA and/or kV according to patient size and/or use of iterative reconstruction technique. COMPARISON:  None Available. FINDINGS: Brain: No acute hemorrhage. There is periventricular hypoattenuation compatible with chronic microvascular disease. Old right basal ganglia small vessel infarct. There is a mass of the sella turcica and anterior skull base that measures 4.0 cm AP x 3.1 cm TV x 3.4 cm CC . the mass extends through the floor of the anterior cranial fossa into the ethmoid sinus. Vascular: No abnormal hyperdensity of the major intracranial arteries or dural venous sinuses. No  intracranial atherosclerosis. Skull: The visualized skull base, calvarium and extracranial soft tissues are normal. Sinuses/Orbits: Tumor extension into the paranasal sinuses, as mentioned above. The orbits are normal. IMPRESSION: 1. No acute intracranial hemorrhage. 2. 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 Primary considerations include macroadenoma, metastatic disease or aggressive meningioma. MRI with and without contrast is recommended for better characterization. Electronically Signed   By: KCletus GashD.  On: 07/27/2021 21:47    Procedures Procedures    Medications Ordered in ED Medications  magnesium sulfate IVPB 2 g 50 mL (has no administration in time range)  potassium chloride 10 mEq in 100 mL IVPB (has no administration in time range)  potassium chloride SA (KLOR-CON M) CR tablet 40 mEq (has no administration in time range)  LORazepam (ATIVAN) injection 1 mg (has no administration in time range)  iohexol (OMNIPAQUE) 300 MG/ML solution 100 mL (has no administration in time range)    ED Course/ Medical Decision Making/ A&P                           Medical Decision Making  KERYN NESSLER is a 86 y.o. female presents to the ED for concern of weakness, increased falls, nasal congestion, headaches, this involves an extensive number of treatment options, and is a complaint that carries with it a high risk of complications and morbidity.  The differential diagnosis includes mass, stroke, electrolyte derangement, dehydration, sinusitis   Additional history obtained:  Additional history obtained from granddaughter and son at bedside External records from outside source obtained and reviewed including prior imaging and labs, specifically CT head from 11/22   Lab Tests:  I Ordered, reviewed, and interpreted labs.  The pertinent results include: No leukocytosis, stable  hemoglobin, potassium of 2.8 and magnesium of 1.6, no other electrolyte derangements, normal renal and liver function, UA without signs of infection.   Imaging Studies ordered:  I ordered imaging studies including CT head, MRI brain with and without contrast, CT chest abdomen pelvis   I independently visualized and interpreted imaging which showed CT head with large mass extending from the skull base into the sinuses, MRI brain with and without contrast ordered for further characterization which shows a nasal cavity and sinus mass extending into the intracranial compartment as well as into the left orbit.  This is likely the cause of the majority of patient's symptoms.  On CT patient was also found to have a new mass within the left lower lobe, also has some sigmoid thickening, colonoscopy needed to assess for potential malignancy. I agree with the radiologist interpretation   Cardiac Monitoring:  The patient was maintained on a cardiac monitor.  I personally viewed and interpreted the cardiac monitored which showed an underlying rhythm of: NSR   Medicines ordered and prescription drug management:  I ordered medication including IV magnesium and potassium replacement for electrolyte derangements I have reviewed the patients home medicines and have made adjustments as needed   ED Course:  I discussed results of imaging at length with patient, as well as granddaughter and son at bedside.  Discussed nasal and sinus cavity mass extending into the intracranial cavity and affecting the left eye, as well as newfound mass in the left lower lung and thickening of the sigmoid colon that could also be due to potential mass. Unclear primary source, prior hx of lymphoma which has been in remission for several years.  Given that patient is symptomatic in particular from the intracranial mass she would benefit from admission and further evaluation.  Family is in agreement with plan for admission with  further specialist consult in the morning.   Consultations Obtained:  I requested consultation with the hospitalist,  and discussed lab and imaging findings as well as pertinent plan - Dr. Velia Meyer will see and admit the patient. Patient would benefit from daytime neurosurgery or ENT consultation for biopsy  of mass, and likely oncology consultation.     Dispostion:  After consideration of the diagnostic results and the patients response to treatment feel that the patent would benefit from admission.          Final Clinical Impression(s) / ED Diagnoses Final diagnoses:  Mass of nasal sinus  Lung mass    Rx / DC Orders ED Discharge Orders     None         Jacqlyn Larsen, [place] 07/28/21 0226    Merryl Hacker, MD 07/28/21 [phone]

## 2021-07-27 NOTE — ED Provider Triage Note (Signed)
Emergency Medicine Provider Triage Evaluation Note  [name] , a 86 y.o. female  was evaluated in triage.  Pt with several complaints. Granddaughter brings patient in today for concern of headache, nosebleeds, and generalized weakness. Granddaughter reports patient has seemed increasingly confused for about 3 weeks or so. She has since stopped bathing herself and can't watch her great-granddaughter like she normally does daily. She fell last night in her bedroom trying to make her bed after she lost her balance, no head trauma. Reported weight loss in past several months. Not on chronic anticoagulation.   Granddaughter also noticed the patient's left eye swelling and drooping starting yesterday.   Review of Systems  Positive: Headache, nasal congestion, nose bleeds, falls, confusion, fatigue Negative: CP, SOB, abd pain  Physical Exam  BP (!) 109/96 (BP Location: Right Arm)   Pulse (!) 102   Temp 98.2 F (36.8 C) (Oral)   Resp 16   Ht '5\' 1"'$  (1.549 m)   Wt 50.3 kg   SpO2 100%   BMI 20.97 kg/m  Gen:   Awake, no distress   Resp:  Normal effort  MSK:   Moves extremities without difficulty  Other:  Nasal congestion, left eye swelling and droop, no slurred speech; A&O x 3  Medical Decision Making  Medically screening exam initiated at 8:44 PM.  Appropriate orders placed.  Laray L Alwin was informed that the remainder of the evaluation will be completed by another provider, this initial triage assessment does not replace that evaluation, and the importance of remaining in the ED until their evaluation is complete.  Workup initiated for AMS   Katai Marsico T, PA-C 07/27/21 2047

## 2021-07-28 ENCOUNTER — Emergency Department (HOSPITAL_COMMUNITY): Payer: Medicare HMO

## 2021-07-28 ENCOUNTER — Encounter (HOSPITAL_COMMUNITY): Payer: Self-pay | Admitting: Internal Medicine

## 2021-07-28 DIAGNOSIS — K219 Gastro-esophageal reflux disease without esophagitis: Secondary | ICD-10-CM

## 2021-07-28 DIAGNOSIS — R519 Headache, unspecified: Secondary | ICD-10-CM | POA: Diagnosis not present

## 2021-07-28 DIAGNOSIS — Z515 Encounter for palliative care: Secondary | ICD-10-CM

## 2021-07-28 DIAGNOSIS — Z841 Family history of disorders of kidney and ureter: Secondary | ICD-10-CM | POA: Diagnosis not present

## 2021-07-28 DIAGNOSIS — Z7982 Long term (current) use of aspirin: Secondary | ICD-10-CM | POA: Diagnosis not present

## 2021-07-28 DIAGNOSIS — R531 Weakness: Secondary | ICD-10-CM

## 2021-07-28 DIAGNOSIS — R54 Age-related physical debility: Secondary | ICD-10-CM | POA: Diagnosis not present

## 2021-07-28 DIAGNOSIS — I10 Essential (primary) hypertension: Secondary | ICD-10-CM | POA: Diagnosis not present

## 2021-07-28 DIAGNOSIS — Z66 Do not resuscitate: Secondary | ICD-10-CM | POA: Diagnosis not present

## 2021-07-28 DIAGNOSIS — Z6820 Body mass index (BMI) 20.0-20.9, adult: Secondary | ICD-10-CM | POA: Diagnosis not present

## 2021-07-28 DIAGNOSIS — F17210 Nicotine dependence, cigarettes, uncomplicated: Secondary | ICD-10-CM | POA: Diagnosis not present

## 2021-07-28 DIAGNOSIS — M47814 Spondylosis without myelopathy or radiculopathy, thoracic region: Secondary | ICD-10-CM | POA: Diagnosis not present

## 2021-07-28 DIAGNOSIS — E876 Hypokalemia: Secondary | ICD-10-CM

## 2021-07-28 DIAGNOSIS — R918 Other nonspecific abnormal finding of lung field: Secondary | ICD-10-CM | POA: Diagnosis not present

## 2021-07-28 DIAGNOSIS — F419 Anxiety disorder, unspecified: Secondary | ICD-10-CM | POA: Diagnosis not present

## 2021-07-28 DIAGNOSIS — Z79899 Other long term (current) drug therapy: Secondary | ICD-10-CM | POA: Diagnosis not present

## 2021-07-28 DIAGNOSIS — K6389 Other specified diseases of intestine: Secondary | ICD-10-CM | POA: Diagnosis not present

## 2021-07-28 DIAGNOSIS — Z789 Other specified health status: Secondary | ICD-10-CM | POA: Diagnosis not present

## 2021-07-28 DIAGNOSIS — R64 Cachexia: Secondary | ICD-10-CM | POA: Diagnosis not present

## 2021-07-28 DIAGNOSIS — Z720 Tobacco use: Secondary | ICD-10-CM

## 2021-07-28 DIAGNOSIS — I2510 Atherosclerotic heart disease of native coronary artery without angina pectoris: Secondary | ICD-10-CM | POA: Diagnosis not present

## 2021-07-28 DIAGNOSIS — I700 Atherosclerosis of aorta: Secondary | ICD-10-CM | POA: Diagnosis not present

## 2021-07-28 DIAGNOSIS — C8590 Non-Hodgkin lymphoma, unspecified, unspecified site: Secondary | ICD-10-CM | POA: Diagnosis not present

## 2021-07-28 DIAGNOSIS — Z7401 Bed confinement status: Secondary | ICD-10-CM | POA: Diagnosis not present

## 2021-07-28 DIAGNOSIS — C300 Malignant neoplasm of nasal cavity: Secondary | ICD-10-CM | POA: Diagnosis not present

## 2021-07-28 DIAGNOSIS — E785 Hyperlipidemia, unspecified: Secondary | ICD-10-CM | POA: Diagnosis not present

## 2021-07-28 DIAGNOSIS — D472 Monoclonal gammopathy: Secondary | ICD-10-CM | POA: Diagnosis not present

## 2021-07-28 DIAGNOSIS — M1990 Unspecified osteoarthritis, unspecified site: Secondary | ICD-10-CM | POA: Diagnosis not present

## 2021-07-28 DIAGNOSIS — N281 Cyst of kidney, acquired: Secondary | ICD-10-CM | POA: Diagnosis not present

## 2021-07-28 DIAGNOSIS — Z955 Presence of coronary angioplasty implant and graft: Secondary | ICD-10-CM | POA: Diagnosis not present

## 2021-07-28 DIAGNOSIS — Z823 Family history of stroke: Secondary | ICD-10-CM | POA: Diagnosis not present

## 2021-07-28 DIAGNOSIS — R69 Illness, unspecified: Secondary | ICD-10-CM | POA: Diagnosis not present

## 2021-07-28 DIAGNOSIS — R040 Epistaxis: Secondary | ICD-10-CM | POA: Diagnosis not present

## 2021-07-28 DIAGNOSIS — J3489 Other specified disorders of nose and nasal sinuses: Secondary | ICD-10-CM | POA: Diagnosis not present

## 2021-07-28 DIAGNOSIS — M47816 Spondylosis without myelopathy or radiculopathy, lumbar region: Secondary | ICD-10-CM | POA: Diagnosis not present

## 2021-07-28 DIAGNOSIS — F411 Generalized anxiety disorder: Secondary | ICD-10-CM | POA: Diagnosis not present

## 2021-07-28 DIAGNOSIS — C7802 Secondary malignant neoplasm of left lung: Secondary | ICD-10-CM | POA: Diagnosis not present

## 2021-07-28 LAB — CBC WITH DIFFERENTIAL/PLATELET
Abs Immature Granulocytes: 0.04 10*3/uL (ref 0.00–0.07)
Basophils Absolute: 0 10*3/uL (ref 0.0–0.1)
Basophils Relative: 0 %
Eosinophils Absolute: 0.1 10*3/uL (ref 0.0–0.5)
Eosinophils Relative: 2 %
HCT: 42.5 % (ref 36.0–46.0)
Hemoglobin: 14.7 g/dL (ref 12.0–15.0)
Immature Granulocytes: 1 %
Lymphocytes Relative: 40 %
Lymphs Abs: 2.9 10*3/uL (ref 0.7–4.0)
MCH: 31.5 pg (ref 26.0–34.0)
MCHC: 34.6 g/dL (ref 30.0–36.0)
MCV: 91 fL (ref 80.0–100.0)
Monocytes Absolute: 0.7 10*3/uL (ref 0.1–1.0)
Monocytes Relative: 9 %
Neutro Abs: 3.4 10*3/uL (ref 1.7–7.7)
Neutrophils Relative %: 48 %
Platelets: 259 10*3/uL (ref 150–400)
RBC: 4.67 MIL/uL (ref 3.87–5.11)
RDW: 15.5 % (ref 11.5–15.5)
WBC: 7.1 10*3/uL (ref 4.0–10.5)
nRBC: 0 % (ref 0.0–0.2)

## 2021-07-28 LAB — PROTIME-INR
INR: 1 (ref 0.8–1.2)
Prothrombin Time: 12.7 seconds (ref 11.4–15.2)

## 2021-07-28 LAB — COMPREHENSIVE METABOLIC PANEL
ALT: 10 U/L (ref 0–44)
AST: 17 U/L (ref 15–41)
Albumin: 2.9 g/dL — ABNORMAL LOW (ref 3.5–5.0)
Alkaline Phosphatase: 80 U/L (ref 38–126)
Anion gap: 12 (ref 5–15)
BUN: 8 mg/dL (ref 8–23)
CO2: 26 mmol/L (ref 22–32)
Calcium: 8.7 mg/dL — ABNORMAL LOW (ref 8.9–10.3)
Chloride: 106 mmol/L (ref 98–111)
Creatinine, Ser: 0.66 mg/dL (ref 0.44–1.00)
GFR, Estimated: >60 mL/min (ref >60)
Glucose, Bld: 79 mg/dL (ref 70–99)
Potassium: 3.3 mmol/L — ABNORMAL LOW (ref 3.5–5.1)
Sodium: 144 mmol/L (ref 135–145)
Total Bilirubin: 0.8 mg/dL (ref 0.3–1.2)
Total Protein: 7 g/dL (ref 6.5–8.1)

## 2021-07-28 LAB — MAGNESIUM: Magnesium: 2.2 mg/dL (ref 1.7–2.4)

## 2021-07-28 LAB — GLUCOSE, CAPILLARY: Glucose-Capillary: 98 mg/dL (ref 70–99)

## 2021-07-28 MED ORDER — HYDRALAZINE HCL 20 MG/ML IJ SOLN: 10.0000 mg | INTRAMUSCULAR | Status: DC | PRN

## 2021-07-28 MED ORDER — ACETAMINOPHEN 650 MG RE SUPP: 650.0000 mg | Freq: Four times a day (QID) | RECTAL | Status: DC | PRN

## 2021-07-28 MED ORDER — LOSARTAN POTASSIUM 50 MG PO TABS
100.0000 mg | ORAL_TABLET | Freq: Every day | ORAL | Status: DC
  Administered 2021-07-28 – 2021-07-31 (×4): 100 mg via ORAL
  Filled 2021-07-28 (×4): qty 2

## 2021-07-28 MED ORDER — LABETALOL HCL 5 MG/ML IV SOLN: 10.0000 mg | INTRAVENOUS | Status: DC | PRN

## 2021-07-28 MED ORDER — METOPROLOL SUCCINATE ER 25 MG PO TB24
25.0000 mg | ORAL_TABLET | Freq: Every day | ORAL | Status: DC
  Administered 2021-07-28 – 2021-07-31 (×4): 25 mg via ORAL
  Filled 2021-07-28 (×4): qty 1

## 2021-07-28 MED ORDER — ACETAMINOPHEN 325 MG PO TABS
650.0000 mg | ORAL_TABLET | Freq: Four times a day (QID) | ORAL | Status: DC | PRN
Start: 2021-07-28 — End: 2021-07-29

## 2021-07-28 MED ORDER — DULOXETINE HCL 60 MG PO CPEP
60.0000 mg | ORAL_CAPSULE | Freq: Every day | ORAL | Status: DC
  Administered 2021-07-28: 60 mg via ORAL
  Filled 2021-07-28: qty 1

## 2021-07-28 MED ORDER — NICOTINE 14 MG/24HR TD PT24: 14.0000 mg | MEDICATED_PATCH | Freq: Every day | TRANSDERMAL | Status: DC | PRN

## 2021-07-28 MED ORDER — BUSPIRONE HCL 5 MG PO TABS
5.0000 mg | ORAL_TABLET | Freq: Two times a day (BID) | ORAL | Status: DC
  Administered 2021-07-28 – 2021-07-31 (×7): 5 mg via ORAL
  Filled 2021-07-28 (×7): qty 1

## 2021-07-28 MED ORDER — IOHEXOL 300 MG/ML  SOLN
130.0000 mL | Freq: Once | INTRAMUSCULAR | Status: AC | PRN
  Administered 2021-07-28: 130 mL via INTRAVENOUS

## 2021-07-28 MED ORDER — FAMOTIDINE 20 MG PO TABS
20.0000 mg | ORAL_TABLET | Freq: Two times a day (BID) | ORAL | Status: DC
  Administered 2021-07-28 – 2021-07-31 (×7): 20 mg via ORAL
  Filled 2021-07-28 (×7): qty 1

## 2021-07-28 MED ORDER — ORAL CARE MOUTH RINSE: 15.0000 mL | OROMUCOSAL | Status: DC | PRN

## 2021-07-28 NOTE — Plan of Care (Signed)
  Problem: Education: Goal: Knowledge of General Education information will improve Description Including pain rating scale, medication(s)/side effects and non-pharmacologic comfort measures Outcome: Progressing   Problem: Health Behavior/Discharge Planning: Goal: Ability to manage health-related needs will improve Outcome: Progressing   

## 2021-07-28 NOTE — Progress Notes (Signed)
Progress Note    [name]   IRC:[id number]  DOB: Apr 30, 1935  DOA: 07/27/2021     0 PCP: Hoyt Koch, MD  Initial CC: weakness  Hospital Course: [name] is an 86 yo female with PMH Stave IV follicular B-cell lymphoma (in remission), MDS, HTN, GAD, tobacco abuse who presented with worsening weakness at home.  History was provided from family on admission.  She has continued to have a reported appropriate appetite but has had ongoing weight loss despite this with associated generalized weakness.  She also has been complaining of a frontal headache and some questionable vision changes.  She denied any nausea/vomiting.  Denied any blood in stools nor any abdominal pain.  Has had nasal drainage of brown/black mucus. She was last seen by oncology, Dr. Alvy Bimler in Sep 2020 with no signs of cancer recurrence at that time. She was discharged from oncology at that time as well.   On admission she underwent workup.  CT head showed a large heterogenous mass of the sella turcica and planum sphenoidale with erosion of the skull base and extension into the paranasal sinuses.  MRI brain then obtained which showed large mass centered in the nasal cavity extending into the paranasal sinuses and invading the intracranial compartment through the anterior skull base. CT chest/abdomen/pelvis was also then obtained for further staging which showed a 4.8 cm x 2.1 cm x 2.2 cm lobulated left lower lobe mass.  Stable diffuse circumferential thickening of the sigmoid junction.  Interval History:  Family present bedside this morning. Reviewed the imaging findings and potential possibilities to be the best of my ability. After a long discussion, they seem to want to try and know what her masses are, even if treatment wasn't an option, especially b/c she'd been in remission from her lymphoma.  The patient herself was laying in bed appearing weak and borderline cachectic. She was able to answer questions and her  biggest complaint was ongoing frontal HA and some vision changes which she could not elaborate on. She also endorses feeling off gait when up and moving some too.   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 Bilateral cavernous sinus invasion. Tumor extension to the left orbital apex with possible encasement of a portion of left optic nerve  - follow up pulm consult to see if lung mass amenable to bronch biopsy; if not, then may need to consider ENT consult and/or if nasal mass is approachable   Generalized weakness - suspected multifactorial from underlying presumed new/recurrent malignancy and associated weight loss - she appears to have profound weakness when evaluated this morning bedside and appears to be developing cachexia  - follow up PT/OT evals; currently does not appear that family can care for at home with state of weakness - RD consult requested due to concern for ensuing malnutrition contributing  Lung mass - new LLL mass noted on CT imaging; differential includes primary lung mass with mets to sinonasal cavity and erosion into anterior skull vs primary lesion is the sinonasal mass with lung mets vs recurrent lymphoma with spread - discussed with IR; mass is more amenable to possible bronch given that bronchus terminates into mass - also discussed with family; at this time they are wishing for a tissue diagnosis to help guide decisions since she was previously cancer free - given severity of findings on images her prognosis may already be rather poor  with minimal improvement if treatment were even an option; will await further evaluation by oncology for input on some of this if possible - palliative care also consulted to help guide discussions  Hypomagnesemia - Replete and recheck as needed  Hypokalemia - Replete and recheck as needed  GERD (gastroesophageal reflux disease) - Continue  Pepcid  Essential hypertension - Continue losartan and Toprol  Tobacco abuse - Patient conveys that they are a current smoker, having smoked 0.5 ppd for the last 50 years .  Notable in the context of LLL mass - continue nicotine patch   Anxiety state - Med rec complete.  Patient no longer on Cymbalta - Continue BuSpar   Old records reviewed in assessment of this patient  Antimicrobials:   DVT prophylaxis:  SCDs Start: 07/28/21 0203   Code Status:   Code Status: Full Code  Mobility Assessment (last 72 hours)     Mobility Assessment     Row Name 07/28/21 1500 07/28/21 0758 07/28/21 0433       Does patient have an order for bedrest or is patient medically unstable -- No - Continue assessment No - Continue assessment     What is the highest level of mobility based on the progressive mobility assessment? Level 4 (Walks with assist in room) - Balance while marching in place and cannot step forward and back - Complete -- Level 2 (Chairfast) - Balance while sitting on edge of bed and cannot stand              Disposition Plan:  Pending PT/OT Status is: Inpt  Objective: Blood pressure (!) 182/65, pulse 74, temperature 98.7 F (37.1 C), temperature source Oral, resp. rate 19, height '5\' 1"'$  (1.549 m), weight 52 kg, SpO2 100 %.  Examination:  Physical Exam Constitutional:      Comments: Very frail/weak and cachectic appearing elderly woman laying in bed with gross weakness   HENT:     Head: Normocephalic.     Nose:     Comments: Unable to visualize septum B/L due to profuse drainage after attempt at clearing; left nasal passage unable to move air thru    Mouth/Throat:     Mouth: Mucous membranes are dry.  Eyes:     Extraocular Movements: Extraocular movements intact.  Cardiovascular:     Rate and Rhythm: Normal rate and regular rhythm.  Pulmonary:     Effort: Pulmonary effort is normal.     Breath sounds: Normal breath sounds.  Abdominal:     General: Bowel sounds  are normal. There is no distension.     Palpations: Abdomen is soft.  Musculoskeletal:        General: No swelling. Normal range of motion.     Cervical back: Normal range of motion and neck supple.  Skin:    General: Skin is warm and dry.  Neurological:     General: No focal deficit present.  Psychiatric:        Mood and Affect: Mood normal.      Consultants:  Pulm Oncology  Procedures:    Data Reviewed: Results for orders placed or performed during the hospital encounter of 07/27/21 (from the past 24 hour(s))  Comprehensive metabolic panel     Status: Abnormal   Collection Time: 07/27/21  8:51 PM  Result Value Ref Range   Sodium 140 135 - 145 mmol/L   Potassium 2.8 (L) 3.5 - 5.1 mmol/L   Chloride 102 98 - 111 mmol/L   CO2 28 22 -  32 mmol/L   Glucose, Bld 100 (H) 70 - 99 mg/dL   BUN 12 8 - 23 mg/dL   Creatinine, Ser 0.84 0.44 - 1.00 mg/dL   Calcium 8.4 (L) 8.9 - 10.3 mg/dL   Total Protein 6.8 6.5 - 8.1 g/dL   Albumin 3.0 (L) 3.5 - 5.0 g/dL   AST 16 15 - 41 U/L   ALT 11 0 - 44 U/L   Alkaline Phosphatase 70 38 - 126 U/L   Total Bilirubin 0.6 0.3 - 1.2 mg/dL   GFR, Estimated >60 >60 mL/min   Anion gap 10 5 - 15  CBC     Status: Abnormal   Collection Time: 07/27/21  8:51 PM  Result Value Ref Range   WBC 7.5 4.0 - 10.5 K/uL   RBC 3.73 (L) 3.87 - 5.11 MIL/uL   Hemoglobin 11.7 (L) 12.0 - 15.0 g/dL   HCT 34.7 (L) 36.0 - 46.0 %   MCV 93.0 80.0 - 100.0 fL   MCH 31.4 26.0 - 34.0 pg   MCHC 33.7 30.0 - 36.0 g/dL   RDW 15.4 11.5 - 15.5 %   Platelets 261 150 - 400 K/uL   nRBC 0.0 0.0 - 0.2 %  Magnesium     Status: Abnormal   Collection Time: 07/27/21  8:51 PM  Result Value Ref Range   Magnesium 1.6 (L) 1.7 - 2.4 mg/dL  Urinalysis, Routine w reflex microscopic Urine, Clean Catch     Status: Abnormal   Collection Time: 07/27/21  8:54 PM  Result Value Ref Range   Color, Urine STRAW (A) YELLOW   APPearance CLEAR CLEAR   Specific Gravity, Urine 1.004 (L) 1.005 - 1.030    pH 6.0 5.0 - 8.0   Glucose, UA NEGATIVE NEGATIVE mg/dL   Hgb urine dipstick SMALL (A) NEGATIVE   Bilirubin Urine NEGATIVE NEGATIVE   Ketones, ur NEGATIVE NEGATIVE mg/dL   Protein, ur NEGATIVE NEGATIVE mg/dL   Nitrite NEGATIVE NEGATIVE   Leukocytes,Ua NEGATIVE NEGATIVE   WBC, UA 0-5 0 - 5 WBC/hpf   Bacteria, UA RARE (A) NONE SEEN   Squamous Epithelial / LPF 0-5 0 - 5  Glucose, capillary     Status: None   Collection Time: 07/27/21  8:57 PM  Result Value Ref Range   Glucose-Capillary 98 70 - 99 mg/dL  CBC with Differential/Platelet     Status: None   Collection Time: 07/28/21  4:53 AM  Result Value Ref Range   WBC 7.1 4.0 - 10.5 K/uL   RBC 4.67 3.87 - 5.11 MIL/uL   Hemoglobin 14.7 12.0 - 15.0 g/dL   HCT 42.5 36.0 - 46.0 %   MCV 91.0 80.0 - 100.0 fL   MCH 31.5 26.0 - 34.0 pg   MCHC 34.6 30.0 - 36.0 g/dL   RDW 15.5 11.5 - 15.5 %   Platelets 259 150 - 400 K/uL   nRBC 0.0 0.0 - 0.2 %   Neutrophils Relative % 48 %   Neutro Abs 3.4 1.7 - 7.7 K/uL   Lymphocytes Relative 40 %   Lymphs Abs 2.9 0.7 - 4.0 K/uL   Monocytes Relative 9 %   Monocytes Absolute 0.7 0.1 - 1.0 K/uL   Eosinophils Relative 2 %   Eosinophils Absolute 0.1 0.0 - 0.5 K/uL   Basophils Relative 0 %   Basophils Absolute 0.0 0.0 - 0.1 K/uL   Immature Granulocytes 1 %   Abs Immature Granulocytes 0.04 0.00 - 0.07 K/uL  Comprehensive metabolic panel  Status: Abnormal   Collection Time: 07/28/21  4:53 AM  Result Value Ref Range   Sodium 144 135 - 145 mmol/L   Potassium 3.3 (L) 3.5 - 5.1 mmol/L   Chloride 106 98 - 111 mmol/L   CO2 26 22 - 32 mmol/L   Glucose, Bld 79 70 - 99 mg/dL   BUN 8 8 - 23 mg/dL   Creatinine, Ser 0.66 0.44 - 1.00 mg/dL   Calcium 8.7 (L) 8.9 - 10.3 mg/dL   Total Protein 7.0 6.5 - 8.1 g/dL   Albumin 2.9 (L) 3.5 - 5.0 g/dL   AST 17 15 - 41 U/L   ALT 10 0 - 44 U/L   Alkaline Phosphatase 80 38 - 126 U/L   Total Bilirubin 0.8 0.3 - 1.2 mg/dL   GFR, Estimated >60 >60 mL/min   Anion gap 12 5 -  15  Magnesium     Status: None   Collection Time: 07/28/21  4:53 AM  Result Value Ref Range   Magnesium 2.2 1.7 - 2.4 mg/dL  Protime-INR     Status: None   Collection Time: 07/28/21 10:16 AM  Result Value Ref Range   Prothrombin Time 12.7 11.4 - 15.2 seconds   INR 1.0 0.8 - 1.2    I have Reviewed nursing notes, Vitals, and Lab results since pt's last encounter. Pertinent lab results : see above I have ordered test including BMP, CBC, Mg I have reviewed the last note from staff over past 24 hours I have discussed pt's care plan and test results with nursing staff, case manager   LOS: 0 days   Dwyane Dee, MD [facility] 07/28/2021, 4:20 PM

## 2021-07-28 NOTE — Progress Notes (Addendum)
Interventional Radiology Brief Note:  [name] is an 86 year old female with history of follicular lymphoma in remission, MDS, HTN, GAD, tobacco abuse admitted through [facility] ED for generalized weakness, intermittent nose bleeds, nasal congestion, and falls at home.   CT Head showed: Large, heterogeneous mass of the sella turcica and planum sphenoidale with erosion of the skull base and extension into the paranasal sinuses. Primary considerations include macroadenoma, metastatic disease or aggressive meningioma. MRI with and without contrast is recommended for better characterization.  Subsequent MR Brain showed: 1. Large mass centered in the nasal cavity extending into the paranasal sinuses and invading the intracranial compartment through the anterior skull base. 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 Other possibilities include esthesioneuroblastoma, sinonasal lymphoma or, less likely, pituitary macroadenoma. 2. Bilateral cavernous sinus invasion, left-greater-than-right, but the cavernous carotids remain patent. 3. Tumor extension to the left orbital apex with possible in case mint of a portion of the left optic nerve.  A CT Chest/Abdomen/Pelvis was ordered for complete work-up and showed: 1. 4.8 cm x 2.1 cm x 2.2 cm lobulated left lower lobe mass consistent with a primary lung malignancy. 2. Multiple bilateral simple renal cysts. 3. Stable diffuse circumferential thickening of the to sigmoid junction. Correlation with colonoscopy is recommended to exclude the presence of an underlying neoplastic process.  Case reviewed by Dr. Dwaine Gale who recommend Pulmonology consult as bronchus terminates into the mass and may be approachable with bronchoscopy.   Also note that patient is being assessed by Palliative Care for determination of scope of treatment should primary vs. Metastatic disease be confirmed.   No procedure planned in IR at this time.  Please re-consult IR if  needed.  Dr. Sabino Gasser aware.   Samantha Greathouse, MS RD PA-C

## 2021-07-28 NOTE — Assessment & Plan Note (Addendum)
-   Continue losartan and Toprol

## 2021-07-28 NOTE — Assessment & Plan Note (Addendum)
-   suspected multifactorial from underlying presumed new/recurrent malignancy and associated weight loss - she appears to have profound weakness which is likely to progress as she continues to be more lethargic today; intake still ~50% but suspect this will worsen in the near future - see GOC discussions below

## 2021-07-28 NOTE — Evaluation (Addendum)
Physical Therapy Evaluation Patient Details Name: [name] MRN: [id number] DOB: 05/10/1935 Today's Date: 07/28/2021  History of Present Illness  Pt is a 86 y.o. female admitted on 7/4 d/t progressive weakness with suspected metastatic disease. Pt with recent hx of suspected nasal infection that had not improved. Imaging showed nasal mass extending into the paranasal sinuses and invading the intracranial compartment through the anterior skull base. Pt also with lung mass. Admitted for further medical management and determination of goals of care. PMH: follicular lymphoma in remission, MDS, hypertension, generalized anxiety disorder, chronic tobacco abuse  Clinical Impression  Pt agreeable to physical therapy evaluation/treatment session after extensive encouragement (limited effort provided by pt). Pt with impulsive behavior and poor safety awareness. Pt requiring CGA for gait, supervision for bed mobility, and min assist for transfers. Pt currently presents with functional limitations secondary to impairments listed in PT problem list. Pt to benefit from skilled, acute care physical therapy interventions to maximize her functional mobility, independence level, and quality of life.       Recommendations for follow up therapy are one component of a multi-disciplinary discharge planning process, led by the attending physician.  Recommendations may be updated based on patient status, additional functional criteria and insurance authorization.  Follow Up Recommendations Home health PT      Assistance Recommended at Discharge Intermittent Supervision/Assistance  Patient can return home with the following  A little help with walking and/or transfers;A little help with bathing/dressing/bathroom;Assist for transportation;Help with stairs or ramp for entrance;Assistance with cooking/housework    Equipment Recommendations BSC/3in1  Recommendations for Other Services       Functional Status  Assessment Patient has had a recent decline in their functional status and demonstrates the ability to make significant improvements in function in a reasonable and predictable amount of time.     Precautions / Restrictions Precautions Precautions: Fall Restrictions Weight Bearing Restrictions: No      Mobility  Bed Mobility Overal bed mobility: Needs Assistance Bed Mobility: Supine to Sit     Supine to sit: Supervision, HOB elevated     General bed mobility comments: increased time, cueing to initiate    Transfers Overall transfer level: Needs assistance Equipment used: Rolling walker (2 wheels), None Transfers: Sit to/from Stand Sit to Stand: Min assist           General transfer comment: Light Min A to stand from bedside with RW, cues for hand placement. Min A to stand from regular toilet with difficulty standing without surface to push from    Ambulation/Gait Ambulation/Gait assistance: Min guard Gait Distance (Feet): 20 Feet Assistive device: Rolling walker (2 wheels) Gait Pattern/deviations: Decreased step length - right, Decreased step length - left, Drifts right/left Gait velocity: decreased     General Gait Details: Pt impulsive and not following directions with RW leading to unsteadiness when ambulating to and from bathroom. Pt unwilling to ambulate further.  Stairs            Wheelchair Mobility    Modified Rankin (Stroke Patients Only)       Balance Overall balance assessment: Needs assistance Sitting-balance support: No upper extremity supported, Feet supported Sitting balance-Leahy Scale: Fair     Standing balance support: Single extremity supported, Bilateral upper extremity supported, During functional activity Standing balance-Leahy Scale: Fair Standing balance comment: able to stand without UE support, benefits from UE support with mobility  Pertinent Vitals/Pain Pain Assessment Pain  Assessment: 0-10 Pain Score:  (pt unable to quantify when asked) Pain Location: low back and nose Pain Descriptors / Indicators: Grimacing, Guarding Pain Intervention(s): Limited activity within patient's tolerance, Monitored during session    Home Living Family/patient expects to be discharged to:: Private residence Living Arrangements: Children;Other relatives (granddaughter, great grandchild (61 month old)) Available Help at Discharge: Family;Available 24 hours/day Type of Home: House Home Access: Stairs to enter Entrance Stairs-Rails: Can reach both;Left;Right Entrance Stairs-Number of Steps: 2   Home Layout: One level Home Equipment: Conservation officer, nature (2 wheels);Cane - single point;Shower seat - built in;Hand held shower head;Grab bars - tub/shower      Prior Function Prior Level of Function : Independent/Modified Independent;Driving;History of Falls (last six months)             Mobility Comments: pt mainly uses SPC ADLs Comments: Per family, pt very active, Independent with ADLs, does basic IADLs in the home, will watch her great grandchild, recently stopped driving 2 months ago (was previously going into drug stores on her own). Pt's family now takes care of her errands. Pt was also able to prepare meals at baseline.     Hand Dominance   Dominant Hand: Right    Extremity/Trunk Assessment   Upper Extremity Assessment Upper Extremity Assessment: Defer to OT evaluation    Lower Extremity Assessment Lower Extremity Assessment:  (Grossly 3+ to 4-/5 in major muscle groups bilaterally)    Cervical / Trunk Assessment Cervical / Trunk Assessment: Kyphotic (significantly)  Communication   Communication: HOH  Cognition Arousal/Alertness: Awake/alert Behavior During Therapy: WFL for tasks assessed/performed, Impulsive Overall Cognitive Status: Impaired/Different from baseline Area of Impairment: Orientation, Attention, Memory, Following commands, Safety/judgement,  Awareness, Problem solving                 Orientation Level: Disoriented to, Situation Current Attention Level: Sustained Memory: Decreased short-term memory Following Commands: Follows one step commands with increased time Safety/Judgement: Decreased awareness of safety, Decreased awareness of deficits Awareness: Emergent Problem Solving: Slow processing, Difficulty sequencing, Requires verbal cues, Requires tactile cues General Comments: pleasant, participatory after encouragement (required increased time), impulsive with movements and DME use requiring max cues to attempt RW use. Able to orient to current month and location        General Comments General comments (skin integrity, edema, etc.): Family present during evaluation    Exercises     Assessment/Plan    PT Assessment Patient needs continued PT services  PT Problem List Decreased strength;Decreased activity tolerance;Decreased balance;Decreased mobility;Decreased cognition;Decreased safety awareness;Decreased knowledge of use of DME;Decreased knowledge of precautions;Pain       PT Treatment Interventions DME instruction;Gait training;Stair training;Functional mobility training;Therapeutic exercise;Therapeutic activities;Neuromuscular re-education;Balance training;Cognitive remediation;Patient/family education    PT Goals (Current goals can be found in the Care Plan section)  Acute Rehab PT Goals Patient Stated Goal: none stated PT Goal Formulation: With patient Time For Goal Achievement: 08/05/21 Potential to Achieve Goals: Good (if participatory)    Frequency Min 3X/week     Co-evaluation   Reason for Co-Treatment: For patient/therapist safety;Necessary to address cognition/behavior during functional activity   OT goals addressed during session: ADL's and self-care       AM-PAC PT "6 Clicks" Mobility  Outcome Measure Help needed turning from your back to your side while in a flat bed without using  bedrails?: A Little Help needed moving from lying on your back to sitting on the side of a flat bed without  using bedrails?: A Little Help needed moving to and from a bed to a chair (including a wheelchair)?: A Little Help needed standing up from a chair using your arms (e.g., wheelchair or bedside chair)?: A Little Help needed to walk in hospital room?: A Little Help needed climbing 3-5 steps with a railing? : A Lot 6 Click Score: 17    End of Session Equipment Utilized During Treatment: Gait belt Activity Tolerance: Other (comment) (Treatment limited by pt's reduced effort) Patient left: in chair;with call bell/phone within reach;with chair alarm set;with family/visitor present   PT Visit Diagnosis: Other abnormalities of gait and mobility (R26.89);History of falling (Z91.81);Pain;Muscle weakness (generalized) (M62.81)    Time: 8403-7543 PT Time Calculation (min) (ACUTE ONLY): 31 min   Charges:   PT Evaluation $PT Eval Moderate Complexity: 1 Mod          Donna Bernard, PT   [facility] 07/28/2021, 4:50 PM

## 2021-07-28 NOTE — Assessment & Plan Note (Addendum)
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 Bilateral cavernous sinus invasion. Tumor extension to the left orbital apex with possible encasement of a portion of left optic nerve  - see lung mass discussion and GOC discussion - no attempts of biopsy at this time; instead will pursue comfort care

## 2021-07-28 NOTE — Consult Note (Addendum)
Consultation Note Date: 07/28/2021   Patient Name: [name]  DOB: 11/05/1935  MRN: [id number]  Age / Sex: 86 y.o., female  PCP: Hoyt Koch, MD Referring Physician: Dwyane Dee, MD  Reason for Consultation: Establishing goals of care  HPI/Patient Profile: 86 y.o. female  with past medical history of follicular lymphoma (in remission), MDS, GAD, HTN, hx of MGUS, DJD, IBS, fall with nasal bone fracture (11/2020), vitamin B12 deficiency, and chronic tobacco abuse (half PPD x50 years) admitted on 07/27/2021 with complaints of generalized weakness for approximately 2 weeks.  PTA, patient was experiencing progressive rhinitis rhinorrhea, was diagnosed with allergic rhinitis by PCP, and had been using intranasal Flonase daily without relief of symptoms.  CT of head revealed a large heterogeneous mass of the sella turcica and plan a sphenoid alley with aeration of the skull base.  MRI of the brain revealed large mass centered in the nasal cavity extending into the paranasal sinuses and invading the intracranial compartment through the anterior skull base.  CT of chest, abdomen, and pelvis with IV contrast revealed a lobulated left lower lobe mass consistent with primary lung malignancy as well as diffuse circumferential thickening of the sigmoid colon. Underlying neoplastic process/metastatic disease is suspected.   Palliative medicine team was consulted to discuss goals of care.  Clinical Assessment and Goals of Care: I have reviewed medical records including EPIC notes, labs and imaging, assessed the patient and then met with patient and her granddaughter Dionza to discuss diagnosis prognosis, [place], EOL wishes, disposition and options.  Patient is resting in bed in no apparent distress.  She is easily arousable but then quickly returns to sleep.  She is unable to participate in goals of care discussion at  this time.  Majority of the conversation was had with patient's family, her granddaughter Dionza, daughter Nevin Bloodgood, and son Evette Doffing.  I introduced Palliative Medicine as specialized medical care for people living with serious illness. It focuses on providing relief from the symptoms and stress of a serious illness. The goal is to improve quality of life for both the patient and the family.  We discussed a brief life review of the patient.  Patient has 2 children, a son and a daughter, and 2 grandsons and 1 granddaughter (1 granddaughter passed suddenly several years ago).  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  Her husband passed in 2017.  Patient and granddaughter have been living together since that time.  As far as functional and nutritional status PTA granddaughter endorses patient was becoming weaker and less active over the past several weeks.  Granddaughter endorses healthy appetite and no changes in p.o. intake PTA.  Dionza shares that she noticed that the patient was not as interactive with her great granddaughter (65 months old who also lives with her).  Dionza is shocked with the news that the patient has a mass in her nasal cavity as well as her lung.  She thought it was just a sinus infection  that she could not shake.  However, she says looking back she can see that there were signs that her grandmother was declining or that something was wrong.  Therapeutic silence and active listening provided for Dionza to share her thoughts and emotions regarding her grandmother's current health status.  We discussed patient's current illness, highlighting that patient has two masses (nasal cavity and lung). I attempted to elicit values and goals of care important to the patient. When asked if she would want to move forward with treatment or to be kept comfortable, patient said she would like to "have a few treatments".  Dionza says that she wants to  make sure her grandmother does not suffer.  We discussed need for oncology/radiology to weigh in on what patient's options are, if any, in order to better determine next steps in treatment plan.  Family asked appropriate questions regarding staging, prognosis, and quality of life with and without aggressive treatments.   Advance directives, concepts specific to code status, artificial feeding and hydration, and rehospitalization were considered and discussed.  Family states patient has never made her wishes known.  They shared that they would want to "hear it straight from her" as far as advanced care wishes and CODE STATUS.  Full code remains.  Family is facing treatment option decisions, advanced directive, and anticipatory care needs. They would like to hear from rad/onc. We discussed the importance of continued conversation with family and the medical providers regarding overall plan of care and treatment options, ensuring decisions are within the context of the patient's values and GOCs.    Questions and concerns were addressed. The family was encouraged to call with questions or concerns.  I am on service and plan on rounding on the patient tomorrow.  Primary Decision Maker NEXT OF KIN  Code Status/Advance Care Planning: Full code  Prognosis:   Unable to determine  Discharge Planning: To Be Determined  Primary Diagnoses: Present on Admission:  Nasal mass  Lung mass  Hypokalemia  Hypomagnesemia  Anxiety state  Essential hypertension  GERD (gastroesophageal reflux disease)  Tobacco abuse   Physical Exam Vitals reviewed.  Constitutional:      Comments: frail  HENT:     Nose: Rhinorrhea present.     Mouth/Throat:     Mouth: Mucous membranes are moist.  Eyes:     Pupils: Pupils are equal, round, and reactive to light.  Cardiovascular:     Rate and Rhythm: Normal rate.     Pulses: Normal pulses.  Pulmonary:     Effort: Pulmonary effort is normal.  Abdominal:      Palpations: Abdomen is soft.  Musculoskeletal:     Comments: Generalized weakness  Skin:    General: Skin is warm and dry.     Comments: Purewick in place  Neurological:     Mental Status: She is alert and oriented to person, place, and time.  Psychiatric:        Mood and Affect: Mood normal.        Behavior: Behavior normal.        Thought Content: Thought content normal.        Judgment: Judgment normal.     Palliative Assessment/Data: 40%     I discussed this patient's plan of care with patient, patient's granddaughter Dionza, patient's daughter Nevin Bloodgood.  Thank you for this consult. Palliative medicine will continue to follow and assist holistically.   Time Total: 140 minutes Greater than 50%  of this time was spent counseling  and coordinating care related to the above assessment and plan.  Signed by: Jordan Hawks, DNP, FNP-BC Palliative Medicine    Please contact Palliative Medicine Team phone at [phone] for questions and concerns.  For individual provider: See Shea Evans

## 2021-07-28 NOTE — ED Notes (Signed)
Patient transported to CT 

## 2021-07-28 NOTE — Assessment & Plan Note (Addendum)
-   new LLL mass noted on CT imaging; differential includes primary lung mass with mets to sinonasal cavity and erosion into anterior skull vs primary lesion is the sinonasal mass with lung mets vs recurrent lymphoma with spread (all would seem to bear poor prognosis regardless) - discussed with IR; mass is more amenable to possible bronch given that bronchus terminates into mass (but still high risk anesthesia for bronch so also not ideal). Then discussed with ENT for possible nasal biopsy but prior to this consult, the family was able to talk with Dr. Alvy Bimler in detail and ultimately decided on no biopsy and instead pursing comfort care/hospice  -Plan is for discharging to [facility]

## 2021-07-28 NOTE — Consult Note (Signed)
NAME:  [name], MRN:  [id number], DOB:  1935-11-28, LOS: 0 ADMISSION DATE:  07/27/2021, CONSULTATION DATE:  07/28/21 REFERRING MD:  Sabino Gasser CHIEF COMPLAINT:  lung mass   History of Present Illness:  [name] is a 86 y.o. F with PMH significant for long term tobacco use, follicular lymphoma, IBS, HTN, CAD who was admitted with several weeks of generalized weakness with frontal HA and nasal drainage.   A CT head was obtained which showed large mass of the sella turcica and planum sphenoidale with erosion in to th skull base and extension in to the paranasal sinuses and intracranial compartment of the anterior skull base.   Further work-up revealed 4.8 cm x 2.1 cm x 2.2 cm lobulated left lower lobe mass and Stable diffuse circumferential thickening of the to sigmoid junction.  PCCM consulted for possible bronchoscopy for tissue diagnosis.  Pt and family have met with palliative care and are currently exploring options, she is currently full code.   Pertinent  Medical History   has a past medical history of Abnormal imaging of thyroid (04/06/2015), Anemia, Anxiety, Benign hypertension, CAD (coronary artery disease), Chronic diarrhea, Constipation, Depressive disorder, DJD (degenerative joint disease), Duodenitis, Dysphagia (4/70/9628), Follicular lymphoma ([place]) (10/07/2013), Gastritis, GERD (gastroesophageal reflux disease), Hyperlipidemia, IBS (irritable bowel syndrome), Insomnia (02/28/2014), Internal hemorrhoids, Lymphadenopathy (10/22/2012), Monoclonal gammopathy, Sore throat (04/06/2015), Urge incontinence, and Vitamin B12 deficiency.   Significant Hospital Events: Including procedures, antibiotic start and stop dates in addition to other pertinent events   7/4 admit to [facility] 7/5 PCCM consult, continues to have headache and weakness  Interim History / Subjective:  Pt has been fatigued, though up to the bathroom and is answering family's questions though seems confused to situation.  Objective    Blood pressure (!) 182/65, pulse 74, temperature 98.7 F (37.1 C), temperature source Oral, resp. rate 19, height 5' 1" (1.549 m), weight 52 kg, SpO2 100 %.        Intake/Output Summary (Last 24 hours) at 07/28/2021 1607 Last data filed at 07/28/2021 1147 Gross per 24 hour  Intake 712.53 ml  Output 200 ml  Net 512.53 ml   Filed Weights   07/27/21 2039 07/28/21 0500  Weight: 50.3 kg 52 kg    General:  thin F, resting in chair in no distress HEENT: MM pink/moist, sclera anicteric Neuro: intermittently sleeping, arouses to voice, moving all extremities, no focal deficits, confused to situation, speech clear CV: s1s2 rrr, no m/r/g PULM:  lungs clear bilaterally on RA, equal chest rise without distress GI: soft, non-tender Extremities: warm/dry, no edema, decreased muscle bulk Skin: no rashes or lesions  Resolved Hospital Problem list     Assessment & Plan:   Large Nasal and Paranasal Mass invading into anterior skull base L Lower lobe Lung Mass History of Follicular Lymphoma and Tobacco Use -seen by IR and palliative medicine, PCCM consulted for possible bronchoscopy.  Dr. Lake Bells discussed extensively with family, would avoid general anesthesia for bronch if at all possible given poor prognosis and risk of intubation -She follows with Dr. Alvy Bimler outpatient for lymphoma, primary team to contact when he is available  -consider ENT consult for possible awake nasal mass bx -palliative following, family processing and discussing with patient as she is able to participate -thank you for this consult, PCCM will continue to follow periodically     Best Practice (right click and "Reselect all SmartList Selections" daily)   Per primary  Labs   CBC: Recent Labs  Lab 07/27/21  2051 07/28/21 0453  WBC 7.5 7.1  NEUTROABS  --  3.4  HGB 11.7* 14.7  HCT 34.7* 42.5  MCV 93.0 91.0  PLT 261 287    Basic Metabolic Panel: Recent Labs  Lab 07/27/21 2051 07/28/21 0453  NA 140 144   K 2.8* 3.3*  CL 102 106  CO2 28 26  GLUCOSE 100* 79  BUN 12 8  CREATININE 0.84 0.66  CALCIUM 8.4* 8.7*  MG 1.6* 2.2   GFR: Estimated Creatinine Clearance: 40.2 mL/min (by C-G formula based on SCr of 0.66 mg/dL). Recent Labs  Lab 07/27/21 2051 07/28/21 0453  WBC 7.5 7.1    Liver Function Tests: Recent Labs  Lab 07/27/21 2051 07/28/21 0453  AST 16 17  ALT 11 10  ALKPHOS 70 80  BILITOT 0.6 0.8  PROT 6.8 7.0  ALBUMIN 3.0* 2.9*   No results for input(s): "LIPASE", "AMYLASE" in the last 168 hours. No results for input(s): "AMMONIA" in the last 168 hours.  ABG    Component Value Date/Time   TCO2 26 06/07/2017 2128     Coagulation Profile: Recent Labs  Lab 07/28/21 1016  INR 1.0    Cardiac Enzymes: No results for input(s): "CKTOTAL", "CKMB", "CKMBINDEX", "TROPONINI" in the last 168 hours.  HbA1C: Hemoglobin A1C  Date/Time Value Ref Range Status  08/07/2020 11:12 AM 5.8 (A) 4.0 - 5.6 % Final   Hgb A1c MFr Bld  Date/Time Value Ref Range Status  07/16/2021 05:08 PM 6.0 4.6 - 6.5 % Final    Comment:    Glycemic Control Guidelines for People with Diabetes:Non Diabetic:  <6%Goal of Therapy: <7%Additional Action Suggested:  >8%   01/13/2020 04:20 PM 6.0 4.6 - 6.5 % Final    Comment:    Glycemic Control Guidelines for People with Diabetes:Non Diabetic:  <6%Goal of Therapy: <7%Additional Action Suggested:  >8%     CBG: Recent Labs  Lab 07/27/21 2057  GLUCAP 80    Review of Systems:   Unable to obtain secondary to fatigue  Past Medical History:  She,  has a past medical history of Abnormal imaging of thyroid (04/06/2015), Anemia, Anxiety, Benign hypertension, CAD (coronary artery disease), Chronic diarrhea, Constipation, Depressive disorder, DJD (degenerative joint disease), Duodenitis, Dysphagia (6/81/1572), Follicular lymphoma ([place]) (10/07/2013), Gastritis, GERD (gastroesophageal reflux disease), Hyperlipidemia, IBS (irritable bowel syndrome), Insomnia  (02/28/2014), Internal hemorrhoids, Lymphadenopathy (10/22/2012), Monoclonal gammopathy, Sore throat (04/06/2015), Urge incontinence, and Vitamin B12 deficiency.   Surgical History:   Past Surgical History:  Procedure Laterality Date   ABDOMINAL HYSTERECTOMY  1992   with ovarian cystectomy   CORONARY STENT PLACEMENT     EYE SURGERY     cataract   HEMICOLECTOMY     ileocolonic anasomosis     SPINE SURGERY       Social History:   reports that she has been smoking cigarettes. She has a 25.00 pack-year smoking history. She has never used smokeless tobacco. She reports that she does not drink alcohol and does not use drugs.   Family History:  Her family history includes Diabetes in an other family member; Kidney disease in her mother; Stroke in her mother. There is no history of Colon cancer.   Allergies Allergies  Allergen Reactions   Cymbalta [Duloxetine Hcl] Other (See Comments)    Bad dreams     Home Medications  Prior to Admission medications   Medication Sig Start Date End Date Taking? Authorizing Provider  acetaminophen (TYLENOL) 500 MG tablet Take 1,000 mg by mouth every  6 (six) hours as needed for mild pain.   Yes [provider]  aspirin 81 MG tablet Take 81 mg by mouth daily.     Yes [provider]  busPIRone (BUSPAR) 5 MG tablet TAKE 1 TABLET(5 MG) BY MOUTH TWICE DAILY Patient taking differently: Take 5 mg by mouth 2 (two) times daily as needed (anxiety). 10/02/20  Yes Hoyt Koch, MD  Cyanocobalamin (B-12 PO) Take 1 tablet by mouth daily.   Yes [provider]  famotidine (PEPCID) 20 MG tablet Take 20 mg by mouth 2 (two) times daily.   Yes [provider]  fluticasone (FLONASE) 50 MCG/ACT nasal spray Place 2 sprays into both nostrils daily. Patient taking differently: Place 2 sprays into both nostrils daily as needed for allergies. 07/16/21  Yes Hoyt Koch, MD  HYDROcodone-acetaminophen [facility]) 10-325 MG tablet Take 1  tablet by mouth every 6 (six) hours as needed.   Yes [provider]  losartan (COZAAR) 100 MG tablet Take 100 mg by mouth daily. 12/30/19  Yes [provider]  mirtazapine (REMERON) 7.5 MG tablet Take 1 tablet (7.5 mg total) by mouth at bedtime. 07/16/21  Yes Hoyt Koch, MD  nitroGLYCERIN (NITROLINGUAL) 0.4 MG/SPRAY spray Place 1 spray under the tongue every 5 (five) minutes x 3 doses as needed for chest pain.   Yes [provider]  SUMAtriptan (IMITREX) 50 MG tablet Take 1 tablet (50 mg total) by mouth every 2 (two) hours as needed for migraine. May repeat in 2 hours if headache persists or recurs. Patient taking differently: Take 50 mg by mouth See admin instructions. 50 mg daily PRN migraine. May repeat 50 mg in 2 hours if headache persists or recurs. 05/21/21  Yes Hoyt Koch, MD  Vitamin D, Ergocalciferol, (DRISDOL) 1.25 MG (50000 UNIT) CAPS capsule Take 1 capsule (50,000 Units total) by mouth every 7 (seven) days. 07/20/21  Yes Hoyt Koch, MD  amoxicillin-clavulanate (AUGMENTIN) 875-125 MG tablet Take 1 tablet by mouth 2 (two) times daily. Patient not taking: Reported on 07/28/2021 05/21/21   Hoyt Koch, MD  DULoxetine (CYMBALTA) 60 MG capsule Take 1 capsule (60 mg total) by mouth daily. Patient not taking: Reported on 07/28/2021 05/21/21   Hoyt Koch, MD  metoprolol succinate (TOPROL-XL) 25 MG 24 hr tablet 25 mg daily. Patient not taking: Reported on 07/28/2021    [provider]     Critical care time: n/a      Otilio Carpen , PA-C [facility] See Amion for pager If no response to pager , please call [phone] until 7pm After 7:00 pm call Elink  [phone]

## 2021-07-28 NOTE — Assessment & Plan Note (Addendum)
-   Med rec complete.  Patient no longer on Cymbalta - Continue BuSpar

## 2021-07-28 NOTE — Assessment & Plan Note (Addendum)
-   Replete and recheck as needed 

## 2021-07-28 NOTE — ED Notes (Signed)
Pt back from MRI 

## 2021-07-28 NOTE — Hospital Course (Signed)
[name] is an 86 yo female with PMH Stave IV follicular B-cell lymphoma (in remission), MDS, HTN, GAD, tobacco abuse who presented with worsening weakness at home.  History was provided from family on admission.  She has continued to have a reported appropriate appetite but has had ongoing weight loss despite this with associated generalized weakness.  She also has been complaining of a frontal headache and some questionable vision changes.  She denied any nausea/vomiting.  Denied any blood in stools nor any abdominal pain.  Has had nasal drainage of brown/black mucus. She was last seen by oncology, Dr. Alvy Bimler in Sep 2020 with no signs of cancer recurrence at that time. She was discharged from oncology at that time as well.   On admission she underwent workup.  CT head showed a large heterogenous mass of the sella turcica and planum sphenoidale with erosion of the skull base and extension into the paranasal sinuses.  MRI brain then obtained which showed large mass centered in the nasal cavity extending into the paranasal sinuses and invading the intracranial compartment through the anterior skull base. CT chest/abdomen/pelvis was also then obtained for further staging which showed a 4.8 cm x 2.1 cm x 2.2 cm lobulated left lower lobe mass.  Stable diffuse circumferential thickening of the sigmoid junction.

## 2021-07-28 NOTE — Progress Notes (Signed)
OT Cancellation Note  Patient Details Name: MERELYN KLUMP MRN: [id number] DOB: Sep 09, 1935   Cancelled Treatment:    Reason Eval/Treat Not Completed: Other (comment) Per RN, pt with improving cooperation with staff. Re-attempted OT eval though family and MD involved in discussion during this attempt. Will follow up.   Layla Maw 07/28/2021, 11:57 AM

## 2021-07-28 NOTE — Evaluation (Signed)
Occupational Therapy Evaluation Patient Details Name: [name] MRN: [id number] DOB: [birth year] Today's Date: 07/28/2021   History of Present Illness Pt is a 86 y.o. female admitted on 7/4 d/t progressive weakness with suspected metastatic disease. Pt with recent hx of suspected nasal infection that had not improved. Imaging showed nasal mass extending into the paranasal sinuses and invading the intracranial compartment through the anterior skull base. Pt also with lung mass. Admitted for further medical management and determination of goals of care. PMH: follicular lymphoma in remission, MDS, hypertension, generalized anxiety disorder, chronic tobacco abuse   Clinical Impression   PTA, pt lives with family, typically Modified Independent in ADLs, IADLs and mobility using cane. Pt recently stopped driving. Pt presents now with new deficits in cognition, strength, and endurance. With encouragement, pt able to demo bathroom mobility with min guard to Min A with RW with impulsivity noted. Pt often leaving RW behind or stepping around it, so will try cane in next session. Pt requires Min A for UB/LB ADLs due to deficits with attention/sequencing cues helpful. Family present, supportive and can assist at DC. Pt may also benefit from [place] [facility]. Recommended assist for IADLs, including med mgmt initially. Will continue to follow acutely.     Recommendations for follow up therapy are one component of a multi-disciplinary discharge planning process, led by the attending physician.  Recommendations may be updated based on patient status, additional functional criteria and insurance authorization.   Follow Up Recommendations  Home health OT    Assistance Recommended at Discharge Frequent or constant Supervision/Assistance  Patient can return home with the following A little help with walking and/or transfers;A little help with bathing/dressing/bathroom;Assistance with cooking/housework;Direct  supervision/assist for medications management;Direct supervision/assist for financial management;Assist for transportation;Help with stairs or ramp for entrance    Functional Status Assessment  Patient has had a recent decline in their functional status and demonstrates the ability to make significant improvements in function in a reasonable and predictable amount of time.  Equipment Recommendations  BSC/3in1    Recommendations for Other Services       Precautions / Restrictions Precautions Precautions: Fall Restrictions Weight Bearing Restrictions: No      Mobility Bed Mobility Overal bed mobility: Needs Assistance Bed Mobility: Supine to Sit     Supine to sit: Supervision, HOB elevated     General bed mobility comments: increased time, cueing to initiate    Transfers Overall transfer level: Needs assistance Equipment used: Rolling walker (2 wheels), None Transfers: Sit to/from Stand Sit to Stand: Min assist           General transfer comment: Light Min A to stand from bedside with RW, cues for hand placement. Min A to stand from regular toilet with difficulty standing without surface to push from      Balance Overall balance assessment: Needs assistance Sitting-balance support: No upper extremity supported, Feet supported Sitting balance-Leahy Scale: Fair     Standing balance support: Single extremity supported, Bilateral upper extremity supported, During functional activity Standing balance-Leahy Scale: Fair Standing balance comment: able to stand without UE support, benefits from UE support with mobility                           ADL either performed or assessed with clinical judgement   ADL Overall ADL's : Needs assistance/impaired Eating/Feeding: Set up   Grooming: Set up;Sitting;Wash/dry face   Upper Body Bathing: Sitting;Minimal assistance   Lower Body  Bathing: Minimal assistance   Upper Body Dressing : Sitting;Minimal  assistance Upper Body Dressing Details (indicate cue type and reason): cues for sequencing changing soiled gown Lower Body Dressing: Minimal assistance Lower Body Dressing Details (indicate cue type and reason): very flexible, difficulty attending to task to don socks though feel pt physically capable Toilet Transfer: Minimal assistance;Ambulation;Rolling walker (2 wheels);Comfort height toilet Toilet Transfer Details (indicate cue type and reason): Min A to stand from regular toilet. max cues for RW use with pt pushing it out of the way to reach for furniture Toileting- Clothing Manipulation and Hygiene: Minimal assistance;Sitting/lateral lean;Sit to/from stand Toileting - Clothing Manipulation Details (indicate cue type and reason): assist for clothing mgmt, able to perform peri care       General ADL Comments: New cognitive deficits impacting safety and independence with ADLs/mobility     Vision Baseline Vision/History: 1 Wears glasses Ability to See in Adequate Light: 0 Adequate Patient Visual Report: No change from baseline Vision Assessment?: No apparent visual deficits     Perception     Praxis      Pertinent Vitals/Pain Pain Assessment Pain Assessment: No/denies pain     Hand Dominance Right   Extremity/Trunk Assessment Upper Extremity Assessment Upper Extremity Assessment: Generalized weakness   Lower Extremity Assessment Lower Extremity Assessment: Defer to PT evaluation   Cervical / Trunk Assessment Cervical / Trunk Assessment: Kyphotic (significantly)   Communication Communication Communication: HOH   Cognition Arousal/Alertness: Awake/alert Behavior During Therapy: WFL for tasks assessed/performed, Impulsive Overall Cognitive Status: Impaired/Different from baseline Area of Impairment: Orientation, Attention, Memory, Following commands, Safety/judgement, Awareness, Problem solving                 Orientation Level: Disoriented to,  Situation Current Attention Level: Sustained Memory: Decreased short-term memory Following Commands: Follows one step commands with increased time Safety/Judgement: Decreased awareness of safety, Decreased awareness of deficits Awareness: Emergent Problem Solving: Slow processing, Difficulty sequencing, Requires verbal cues, Requires tactile cues General Comments: pleasant, participatory after encouragement, impulsive with movements and DME use requiring max cues to attempt RW use. able to answer month, location     General Comments  Family present, supportive and encouraging    Exercises     Shoulder Instructions      Home Living Family/patient expects to be discharged to:: Private residence Living Arrangements: Children;Other relatives (granddaughter, great grandchild (25 month old)) Available Help at Discharge: Family;Available 24 hours/day Type of Home: House Home Access: Stairs to enter [employer] of Steps: 2 Entrance Stairs-Rails: Right;Left Home Layout: One level     Bathroom Shower/Tub: Occupational psychologist: Standard     Home Equipment: Conservation officer, nature (2 wheels);Cane - single point;Shower seat - built in;Hand held shower head;Grab bars - tub/shower          Prior Functioning/Environment Prior Level of Function : Independent/Modified Independent;Driving             Mobility Comments: use of cane vs no AD for mobility ADLs Comments: Per family, pt very active, Independent with ADLs, does basic IADLs in the home, will watch her great grandchild, recently stopped driving approx 1 month ago        OT Problem List: Decreased strength;Decreased activity tolerance;Impaired balance (sitting and/or standing);Decreased cognition;Decreased safety awareness;Decreased knowledge of use of DME or AE      OT Treatment/Interventions: Self-care/ADL training;Therapeutic exercise;Energy conservation;DME and/or AE instruction;Therapeutic  activities;Patient/family education;Balance training    OT Goals(Current goals can be found in the care  plan section) Acute Rehab OT Goals Patient Stated Goal: would like to have something to drink OT Goal Formulation: With patient/family Time For Goal Achievement: 08/11/21 Potential to Achieve Goals: Good  OT Frequency: Min 2X/week    Co-evaluation PT/OT/SLP Co-Evaluation/Treatment: Yes Reason for Co-Treatment: Necessary to address cognition/behavior during functional activity;For patient/therapist safety   OT goals addressed during session: ADL's and self-care      AM-PAC OT "6 Clicks" Daily Activity     Outcome Measure Help from another person eating meals?: A Little Help from another person taking care of personal grooming?: A Little Help from another person toileting, which includes using toliet, bedpan, or urinal?: A Little Help from another person bathing (including washing, rinsing, drying)?: A Little Help from another person to put on and taking off regular upper body clothing?: A Little Help from another person to put on and taking off regular lower body clothing?: A Little 6 Click Score: 18   End of Session Equipment Utilized During Treatment: Gait belt;Rolling walker (2 wheels) Nurse Communication: Mobility status  Activity Tolerance: Patient tolerated treatment well Patient left: in chair;with call bell/phone within reach;with chair alarm set;with family/visitor present  OT Visit Diagnosis: Other abnormalities of gait and mobility (R26.89);Unsteadiness on feet (R26.81);Other symptoms and signs involving cognitive function                Time: 6945-0388 OT Time Calculation (min): 30 min Charges:  OT General Charges $OT Visit: 1 Visit OT Evaluation $OT Eval Moderate Complexity: 1 Mod  Malachy Chamber, OTR/L Acute Rehab Services Office: [phone]   Layla Maw 07/28/2021, 3:24 PM

## 2021-07-28 NOTE — Assessment & Plan Note (Addendum)
asymptomatic

## 2021-07-28 NOTE — Assessment & Plan Note (Addendum)
-   Patient conveys that they are a current smoker, having smoked 0.5 ppd for the last 50 years .  Notable in the context of LLL mass - continue nicotine patch if needed

## 2021-07-28 NOTE — ED Notes (Signed)
Attempted to perform swallow screen, pt not arousable enough at this time.

## 2021-07-28 NOTE — H&P (Signed)
History and Physical    PLEASE NOTE THAT DRAGON DICT[employer] SOFTWARE WAS USED IN THE CONSTRUCTION OF THIS NOTE.   [name] KAJ:[id number] DOB: Nov 05, 1935 DOA: 07/27/2021  PCP: Samantha Koch, MD  Patient coming from: home   I have personally briefly reviewed patient's old medical records in [place]  Chief Complaint: Generalized weakness  HPI: [name] is a 86 y.o. female with medical history significant for follicular lymphoma in remission, MDS, hypertension, generalized anxiety disorder, chronic tobacco abuse, who is admitted to [facility] on 07/27/2021 with new new diagnosis of suspected metastatic disease after presenting from home to [facility] ED complaining of generalized weakness.   Following history is provided by the patient as well as my discussions with the patient's granddaughter, with whom the patient lives.  Patient has been experience approximately 2 weeks of generalized weakness in the absence of any In the absence of any associated acute focal weakness, acute focal numbness, paresthesias, facial droop, slurred speech, expressive aphasia, acute change in vision, dysphagia, vertigo.  Rather, she has been experiencing progressive rhinitis rhinorrhea over that timeframe, prompting her to present to her PCP, who diagnosed her with allergic rhinitis, and initiated intranasal Flonase, patient reports that she has been compliant with her daily dosing of intranasal Flonase in the interval but notes no significant improvement in her ensuing rhinitis/rhinorrhea.   Per chart review, the patient has a history of follicular lymphoma, which granddaughter reports is in remission.  There is also documentation of a history of MGUS.  Otherwise, granddaughter, patient denies any known history of underlying malignancy.  She is a long-term cigarette smoker, currently smoking half pack per day and having done so over the last 50 years.  Denies any routine alcohol  consumption.  The patient lives at home, with her granddaughter.   ED Course:  Vital signs in the ED were notable for the following: Afebrile; heart rate 73-75; soft blood pressures in the 160s to 170s.  Respiratory rate 16-18, oxygen saturation 98 to 100% room air.  Labs were notable for the following: CMP notable for potassium 2.8, creatinine 0.84, liver enzymes within normal limits.  Serum magnesium level 1.6.  CBC notable for cell count 7500.  Urinalysis showed no evidence of white blood cells.  Imaging and additional notable ED work-up: CT head showed a large heterogeneous mass of the sella turcica and planus sphenoid alley with aeration of the skull base, prompting MRI brain which showed large mass centered in the nasal cavity extending into the paranasal sinuses and invading the intracranial compartment through the anterior skull base.  Ensuing CT chest, abdomen, pelvis with IV contrast showed a lobulated left lower lobe mass consistent with primary lung malignancy in addition to diffuse circumferential thickening of the sigmoid colon, differential including underlying neoplastic process.  While in the ED, the following were administered: Potassium chloride 40 mill colons p.o. x1, magnesium sulfate 2 g IV over 2 hours x 1 dose.  Subsequently, the patient was admitted for further evaluation and management, including determination of goals of care as relates to new diagnosis of potential metastatic disease, including new mass identified in the nasal cavity as well as left lower lobe lung mass.     Review of Systems: As per HPI otherwise 10 point review of systems negative.   Past Medical History:  Diagnosis Date   Abnormal imaging of thyroid 04/06/2015   Anemia    Anxiety    Benign hypertension    CAD (coronary  artery disease)    Chronic diarrhea    Constipation    Depressive disorder    DJD (degenerative joint disease)    Duodenitis    Dysphagia 05/02/1446   Follicular lymphoma  ([place]) 10/07/2013   Gastritis    GERD (gastroesophageal reflux disease)    Hyperlipidemia    IBS (irritable bowel syndrome)    Insomnia 02/28/2014   Internal hemorrhoids    Lymphadenopathy 10/22/2012   Monoclonal gammopathy    Sore throat 04/06/2015   Urge incontinence    Vitamin B12 deficiency     Past Surgical History:  Procedure Laterality Date   ABDOMINAL HYSTERECTOMY  1992   with ovarian cystectomy   CORONARY STENT PLACEMENT     EYE SURGERY     cataract   HEMICOLECTOMY     ileocolonic anasomosis     SPINE SURGERY      Social History:  reports that she has been smoking cigarettes. She has a 25.00 pack-year smoking history. She has never used smokeless tobacco. She reports that she does not drink alcohol and does not use drugs.   No Known Allergies  Family History  Problem Relation Age of Onset   Kidney disease Mother    Stroke Mother    Diabetes Other        sibling   Colon cancer Neg Hx     Family history reviewed and not pertinent    Prior to Admission medications   Medication Sig Start Date End Date Taking? Authorizing Provider  amoxicillin-clavulanate (AUGMENTIN) 875-125 MG tablet Take 1 tablet by mouth 2 (two) times daily. 05/21/21   Samantha Koch, MD  aspirin 81 MG tablet Take 81 mg by mouth daily.      [provider]  busPIRone (BUSPAR) 5 MG tablet TAKE 1 TABLET(5 MG) BY MOUTH TWICE DAILY 10/02/20   Samantha Koch, MD  DULoxetine (CYMBALTA) 60 MG capsule Take 1 capsule (60 mg total) by mouth daily. 05/21/21   Samantha Koch, MD  famotidine (PEPCID) 20 MG tablet Take 20 mg by mouth 2 (two) times daily.    [provider]  fluticasone (FLONASE) 50 MCG/ACT nasal spray Place 2 sprays into both nostrils daily. 07/16/21   Samantha Koch, MD  HYDROcodone-acetaminophen (NORCO) 10-325 MG tablet Take 1 tablet by mouth every 6 (six) hours as needed.    [provider]  losartan (COZAAR) 100 MG tablet Take 100 mg by  mouth daily. 12/30/19   [provider]  metoprolol succinate (TOPROL-XL) 25 MG 24 hr tablet 25 mg daily.    [provider]  mirtazapine (REMERON) 7.5 MG tablet Take 1 tablet (7.5 mg total) by mouth at bedtime. 07/16/21   Samantha Koch, MD  nitroGLYCERIN (NITROLINGUAL) 0.4 MG/SPRAY spray Place 1 spray under the tongue every 5 (five) minutes x 3 doses as needed for chest pain.    [provider]  SUMAtriptan (IMITREX) 50 MG tablet Take 1 tablet (50 mg total) by mouth every 2 (two) hours as needed for migraine. May repeat in 2 hours if headache persists or recurs. 05/21/21   Samantha Koch, MD  Vitamin D, Ergocalciferol, (DRISDOL) 1.25 MG (50000 UNIT) CAPS capsule Take 1 capsule (50,000 Units total) by mouth every 7 (seven) days. 07/20/21   Samantha Koch, MD     Objective    Physical Exam: Vitals:   07/28/21 0300 07/28/21 0345 07/28/21 0419 07/28/21 0500  BP: (!) 188/82 (!) 178/82 (!) 157/70   Pulse: 69  70 71   Resp: 16 18    Temp:  98 F (36.7 C) 97.6 F (36.4 C)   TempSrc:  Axillary Oral   SpO2: 99% 99% 98%   Weight:    52 kg  Height:        General: appears to be stated age; alert, oriented Skin: warm, dry, no rash Head:  AT/[place] Mouth:  Oral mucosa membranes appear moist, normal dentition Neck: supple; trachea midline Heart:  RRR; did not appreciate any M/R/G Lungs: CTAB, did not appreciate any wheezes, rales, or rhonchi Abdomen: + BS; soft, ND, NT Vascular: 2+ pedal pulses b/l; 2+ radial pulses b/l Extremities: no peripheral edema, no muscle wasting Neuro: strength and sensation intact in upper and lower extremities b/l    Labs on Admission: I have personally reviewed following labs and imaging studies  CBC: Recent Labs  Lab 07/27/21 2051 07/28/21 0453  WBC 7.5 7.1  NEUTROABS  --  3.4  HGB 11.7* 14.7  HCT 34.7* 42.5  MCV 93.0 91.0  PLT 261 086   Basic Metabolic Panel: Recent Labs  Lab 07/27/21 2051  07/28/21 0453  NA 140 144  K 2.8* 3.3*  CL 102 106  CO2 28 26  GLUCOSE 100* 79  BUN 12 8  CREATININE 0.84 0.66  CALCIUM 8.4* 8.7*  MG 1.6* 2.2   GFR: Estimated Creatinine Clearance: 40.2 mL/min (by C-G formula based on SCr of 0.66 mg/dL). Liver Function Tests: Recent Labs  Lab 07/27/21 2051 07/28/21 0453  AST 16 17  ALT 11 10  ALKPHOS 70 80  BILITOT 0.6 0.8  PROT 6.8 7.0  ALBUMIN 3.0* 2.9*   No results for input(s): "LIPASE", "AMYLASE" in the last 168 hours. No results for input(s): "AMMONIA" in the last 168 hours. Coagulation Profile: No results for input(s): "INR", "PROTIME" in the last 168 hours. Cardiac Enzymes: No results for input(s): "CKTOTAL", "CKMB", "CKMBINDEX", "TROPONINI" in the last 168 hours. BNP (last 3 results) No results for input(s): "PROBNP" in the last 8760 hours. HbA1C: No results for input(s): "HGBA1C" in the last 72 hours. CBG: Recent Labs  Lab 07/27/21 2057  GLUCAP 98   Lipid Profile: No results for input(s): "CHOL", "HDL", "LDLCALC", "TRIG", "CHOLHDL", "LDLDIRECT" in the last 72 hours. Thyroid Function Tests: No results for input(s): "TSH", "T4TOTAL", "FREET4", "T3FREE", "THYROIDAB" in the last 72 hours. Anemia Panel: No results for input(s): "VITAMINB12", "FOLATE", "FERRITIN", "TIBC", "IRON", "RETICCTPCT" in the last 72 hours. Urine analysis:    Component Value Date/Time   COLORURINE STRAW (A) 07/27/2021 2054   APPEARANCEUR CLEAR 07/27/2021 2054   LABSPEC 1.004 (L) 07/27/2021 2054   PHURINE 6.0 07/27/2021 2054   GLUCOSEU NEGATIVE 07/27/2021 2054   HGBUR SMALL (A) 07/27/2021 2054   [place] NEGATIVE 07/27/2021 2054   [place] NEGATIVE 07/27/2021 2054   PROTEINUR NEGATIVE 07/27/2021 2054   NITRITE NEGATIVE 07/27/2021 2054   LEUKOCYTESUR NEGATIVE 07/27/2021 2054    Radiological Exams on Admission: CT CHEST ABDOMEN PELVIS W CONTRAST  Result Date: 07/28/2021 CLINICAL DATA:  Metastatic disease evaluation. EXAM: CT CHEST,  ABDOMEN, AND PELVIS WITH CONTRAST TECHNIQUE: Multidetector CT imaging of the chest, abdomen and pelvis was performed following the standard protocol during bolus administration of intravenous contrast. RADIATION DOSE REDUCTION: This exam was performed according to the departmental dose-optimization program which includes automated exposure control, adjustment of the mA and/or kV according to patient size and/or use of iterative reconstruction technique. CONTRAST:  <See Chart> OMNIPAQUE IOHEXOL 300 MG/ML SOLN, 167m OMNIPAQUE IOHEXOL 300 MG/ML  SOLN COMPARISON:  September 12, 2012 FINDINGS: CT CHEST FINDINGS Cardiovascular: There is moderate severity calcification of the aortic arch, without evidence of aortic aneurysm. Normal heart size with marked severity coronary artery calcification. No pericardial effusion. Mediastinum/Nodes: No enlarged mediastinal, hilar, or axillary lymph nodes. Multiple subcentimeter cystic appearing areas are seen within the right and left lobes of the thyroid gland. The trachea and esophagus demonstrate no significant findings. Lungs/Pleura: A 4.8 cm x 2.1 cm x 2.2 cm lobulated lung mass is seen within the posterior aspect of the left lower lobe. Mild lingular scarring and/or atelectasis is noted. There is no evidence of acute infiltrate, pleural effusion or pneumothorax. Musculoskeletal: Multilevel degenerative changes seen throughout the thoracic spine. CT ABDOMEN PELVIS FINDINGS Hepatobiliary: No focal liver abnormality is seen. No gallstones, gallbladder wall thickening, or biliary dilatation. Pancreas: Unremarkable. No pancreatic ductal dilatation or surrounding inflammatory changes. Spleen: Normal in size without focal abnormality. Adrenals/Urinary Tract: Adrenal glands are unremarkable. Kidneys are normal in size, without renal calculi or hydronephrosis. Multiple bilateral simple renal cysts are seen. The largest is located within the mid left kidney and measures approximately 5.8 cm x  6.7 cm. Bladder is unremarkable. Stomach/Bowel: Stomach is within normal limits. The appendix is not clearly identified. A very large amount of stool is seen throughout the colon. No evidence of bowel dilatation. There is stable diffuse circumferential thickening of the to sigmoid junction. Vascular/Lymphatic: Aortic atherosclerosis. No enlarged abdominal or pelvic lymph nodes. Reproductive: Status post hysterectomy. No adnexal masses. Other: No abdominal wall hernia or abnormality. No abdominopelvic ascites. Musculoskeletal: Multilevel degenerative changes seen throughout the lumbar spine. IMPRESSION: 1. 4.8 cm x 2.1 cm x 2.2 cm lobulated left lower lobe mass consistent with a primary lung malignancy. 2. Multiple bilateral simple renal cysts. 3. Stable diffuse circumferential thickening of the to sigmoid junction. Correlation with colonoscopy is recommended to exclude the presence of an underlying neoplastic process. Aortic Atherosclerosis (ICD10-I70.0). Electronically Signed   By: Virgina Norfolk M.D.   On: 07/28/2021 01:31   MR BRAIN W WO CONTRAST  Result Date: 07/28/2021 CLINICAL DATA:  New anterior skull base mass. Headache and nose bleeds. EXAM: MRI HEAD WITHOUT AND WITH CONTRAST TECHNIQUE: Multiplanar, multiecho pulse sequences of the brain and surrounding structures were obtained without and with intravenous contrast. CONTRAST:  84m GADAVIST GADOBUTROL 1 MMOL/ML IV SOLN COMPARISON:  None Available. FINDINGS: Brain: No acute infarct, mass effect or extra-axial collection. Fewer than 5 scattered microhemorrhages in a nonspecific pattern. There is multifocal hyperintense T2-weighted signal within the white matter. Generalized volume loss. Multiple old small vessel infarcts of the basal ganglia corona radiata. There is a large mass centered at the sinonasal cavity that erodes through most of the anterior skull base and measures 4.4 x 6.0 x 5.6 cm. There is bilateral cavernous sinus invasion (grade 3 on the  right and grade 4 on the left). Vascular: Cavernous carotid flow voids are preserved. There is mild mass effect on the proximal optic nerves. The left optic nerve appears to be encased by the mass, but it is difficult to be certain due to motion. There is extension to the left orbital apex and into both MVassar Skull and upper cervical spine: Destruction of most of the anterior skull base. Visualized upper cervical spine is normal. Sinuses/Orbits:Above-described mass fills much of the ethmoid space. There is opacification of all paranasal sinuses. The mass invades the left orbital apex. IMPRESSION: 1. Large mass centered in the nasal cavity extending into the paranasal sinuses  and invading the intracranial compartment through the anterior skull base. 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 Other possibilities include esthesioneuroblastoma, sinonasal lymphoma or, less likely, pituitary macroadenoma. 2. Bilateral cavernous sinus invasion, left-greater-than-right, but the cavernous carotids remain patent. 3. Tumor extension to the left orbital apex with possible in case mint of a portion of the left optic nerve. Electronically Signed   By: Ulyses Jarred M.D.   On: 07/28/2021 00:48   CT Head Wo Contrast  Result Date: 07/27/2021 CLINICAL DATA:  Encephalopathy EXAM: CT HEAD WITHOUT CONTRAST TECHNIQUE: Contiguous axial images were obtained from the base of the skull through the vertex without intravenous contrast. RADIATION DOSE REDUCTION: This exam was performed according to the departmental dose-optimization program which includes automated exposure control, adjustment of the mA and/or kV according to patient size and/or use of iterative reconstruction technique. COMPARISON:  None Available. FINDINGS: Brain: No acute hemorrhage. There is periventricular hypoattenuation compatible with chronic microvascular disease. Old right basal ganglia small vessel infarct. There is a mass of the  sella turcica and anterior skull base that measures 4.0 cm AP x 3.1 cm TV x 3.4 cm CC . the mass extends through the floor of the anterior cranial fossa into the ethmoid sinus. Vascular: No abnormal hyperdensity of the major intracranial arteries or dural venous sinuses. No intracranial atherosclerosis. Skull: The visualized skull base, calvarium and extracranial soft tissues are normal. Sinuses/Orbits: Tumor extension into the paranasal sinuses, as mentioned above. The orbits are normal. IMPRESSION: 1. No acute intracranial hemorrhage. 2. 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 Primary considerations include macroadenoma, metastatic disease or aggressive meningioma. MRI with and without contrast is recommended for better characterization. Electronically Signed   By: Ulyses Jarred M.D.   On: 07/27/2021 21:47      Assessment/Plan   Principal Problem:   Nasal mass Active Problems:   Anxiety state   Tobacco abuse   Essential hypertension   GERD (gastroesophageal reflux disease)   Hypokalemia   Lung mass   Hypomagnesemia   Generalized weakness     #) New diagnosis of left lower lobe lung mass as well as mass within the nasal cavity: Newly identified per imaging this evening, in addition to CT chest abdomen pelvis also showing evidence of diffuse of chronic inferential thickening of the sigmoid colon and, with differential including underlying neoplastic process.  Presentation concerning for new diagnosis of metastatic disease.  From a previous oncologic standpoint, the patient has a documented history of prior follicular in the family, which was reported to be in remission, also documented history of MGUS.  The patient and her granddaughter, with whom the patient lives are aware of these findings.    Plan: will consult palliative care to help assess goals of care to help guide ensuing plan for  diagnostic/therapeutic intervention.  Assessing these goals of care prior to initiating associated consultation with subspecialist.      #) Hypokalemia: Serum potassium level found to be 2.8.  Status post 40 mill colons oral potassium chloride in the ED.  Plan: We will order an additional 20 mill colons of IV potassium chloride over 2 hours x 1 dose now.  Supplementation serum magnesium level, as further detailed below.       #) Hypomagnesemia; presents for magnesium level found to be 1.6.  Status post 2 g of IV magnesium sulfate over 2 hours x 1 dose administered in the  ED.  Relevant in the context of concomitant hypokalemia.  Plan: Recheck serum magnesium level in morning.  Monitor on telemetry.        #) Generalized weakness: 2 weeks of progressive generalized weakness this the patient's presenting complaint this evening.  Without evidence of acute focal weakness.  Likely multifactorial in nature, with suspicion for new diagnosis of metastatic disease, as above.  No evidence of underlying affection at this time, including urinalysis that demonstrated no evidence of UTI, while CT chest showed no evidence of acute to suggest pneumonia.  Liver enzymes within normal limits.   Plan: Fall precautions ordered.  PT/OT consults ordered.  Palliative care consult for suspected goals of care, as above.        #) Essential hypertesion: Patient presents for 2 includes losartan, Toprol-XL.  Systolic blood pressures in the ED noted to be in the 160s to 170s.  Plan: Resume home losartan and Toprol-XL.)   Plus monitoring of ensuing blood pressure via routine vital signs.        #) Generalized anxiety sorter: Documented history of such, on scheduled Cymbalta and BuSpar as an outpatient.  Plan: Continue home Cymbalta and BuSpar.        #) GERD: Documented history of such, on scheduled Pepcid as an outpatient.  Plan: Continue home Pepcid.         #) Chronic tobacco  abuse: Patient conveys that they are a current smoker, having smoked 0.5 ppd for the last 50 years years.  Notable in the context of potential newly metastatic disease, as detailed above, including suspicion for primary lung malignancy in the setting of presenting left lower lobe lung mass identified on today's imaging.  Plan: Counseled the patient (for less than 2 mins) on the importance of complete smoking discontinuation.  Order placed for prn nicotine patch for use during this hospitalization.        DVT prophylaxis: SCD's   Code Status: Full code Family Communication: I discussed the patient's case with her granddaughter, who is present at bedside Disposition Plan: Per Rounding Team Consults called: Order for palliative care consult placed;  Admission status: Inpatient   PLEASE NOTE THAT DRAGON DICTATION SOFTWARE WAS USED IN THE CONSTRUCTION OF THIS NOTE.   [place] DO [facility]  From [place]   07/28/2021, 6:42 AM

## 2021-07-28 NOTE — Progress Notes (Signed)
OT Cancellation Note  Patient Details Name: [name] MRN: [id number] DOB: Jul 15, 1935   Cancelled Treatment:    Reason Eval/Treat Not Completed: Other (comment) per nursing staff, pt agitated, confused and combative overnight into this AM. Requested PT/OT hold off for now and re-attempt later as schedule permits.  Layla Maw 07/28/2021, 8:35 AM

## 2021-07-29 ENCOUNTER — Encounter (HOSPITAL_COMMUNITY): Payer: Self-pay | Admitting: Internal Medicine

## 2021-07-29 DIAGNOSIS — J3489 Other specified disorders of nose and nasal sinuses: Secondary | ICD-10-CM | POA: Diagnosis not present

## 2021-07-29 LAB — BASIC METABOLIC PANEL
Anion gap: 11 (ref 5–15)
BUN: 6 mg/dL — ABNORMAL LOW (ref 8–23)
CO2: 28 mmol/L (ref 22–32)
Calcium: 8.6 mg/dL — ABNORMAL LOW (ref 8.9–10.3)
Chloride: 99 mmol/L (ref 98–111)
Creatinine, Ser: 0.65 mg/dL (ref 0.44–1.00)
GFR, Estimated: >60 mL/min (ref >60)
Glucose, Bld: 86 mg/dL (ref 70–99)
Potassium: 3.2 mmol/L — ABNORMAL LOW (ref 3.5–5.1)
Sodium: 138 mmol/L (ref 135–145)

## 2021-07-29 LAB — CBC WITH DIFFERENTIAL/PLATELET
Abs Immature Granulocytes: 0.04 10*3/uL (ref 0.00–0.07)
Basophils Absolute: 0 10*3/uL (ref 0.0–0.1)
Basophils Relative: 1 %
Eosinophils Absolute: 0.1 10*3/uL (ref 0.0–0.5)
Eosinophils Relative: 1 %
HCT: 42.9 % (ref 36.0–46.0)
Hemoglobin: 14.2 g/dL (ref 12.0–15.0)
Immature Granulocytes: 1 %
Lymphocytes Relative: 43 %
Lymphs Abs: 3.2 10*3/uL (ref 0.7–4.0)
MCH: 30.8 pg (ref 26.0–34.0)
MCHC: 33.1 g/dL (ref 30.0–36.0)
MCV: 93.1 fL (ref 80.0–100.0)
Monocytes Absolute: 0.8 10*3/uL (ref 0.1–1.0)
Monocytes Relative: 10 %
Neutro Abs: 3.4 10*3/uL (ref 1.7–7.7)
Neutrophils Relative %: 44 %
Platelets: 218 10*3/uL (ref 150–400)
RBC: 4.61 MIL/uL (ref 3.87–5.11)
RDW: 15.2 % (ref 11.5–15.5)
WBC: 7.4 10*3/uL (ref 4.0–10.5)
nRBC: 0 % (ref 0.0–0.2)

## 2021-07-29 LAB — MAGNESIUM: Magnesium: 1.9 mg/dL (ref 1.7–2.4)

## 2021-07-29 MED ORDER — GLYCOPYRROLATE 1 MG PO TABS: 1.0000 mg | ORAL_TABLET | ORAL | Status: DC | PRN

## 2021-07-29 MED ORDER — BIOTENE DRY MOUTH MT LIQD: 15.0000 mL | OROMUCOSAL | Status: DC | PRN

## 2021-07-29 MED ORDER — SODIUM CHLORIDE 0.9 % IV SOLN: 250.0000 mL | INTRAVENOUS | Status: DC | PRN

## 2021-07-29 MED ORDER — ACETAMINOPHEN 325 MG PO TABS
650.0000 mg | ORAL_TABLET | Freq: Four times a day (QID) | ORAL | Status: DC | PRN
  Administered 2021-07-30 – 2021-07-31 (×2): 650 mg via ORAL
  Filled 2021-07-29 (×2): qty 2

## 2021-07-29 MED ORDER — POTASSIUM CHLORIDE 20 MEQ PO PACK
40.0000 meq | PACK | Freq: Once | ORAL | Status: AC
Start: 2021-07-29 — End: 2021-07-29
  Administered 2021-07-29: 40 meq via ORAL
  Filled 2021-07-29: qty 2

## 2021-07-29 MED ORDER — GLYCOPYRROLATE 0.2 MG/ML IJ SOLN: 0.2000 mg | INTRAMUSCULAR | Status: DC | PRN

## 2021-07-29 MED ORDER — POLYVINYL ALCOHOL 1.4 % OP SOLN: 1.0000 [drp] | Freq: Four times a day (QID) | OPHTHALMIC | Status: DC | PRN

## 2021-07-29 MED ORDER — HALOPERIDOL LACTATE 5 MG/ML IJ SOLN
0.5000 mg | INTRAMUSCULAR | Status: DC | PRN
  Administered 2021-07-30: 0.5 mg via INTRAVENOUS
  Filled 2021-07-29: qty 1

## 2021-07-29 MED ORDER — SALINE SPRAY 0.65 % NA SOLN
1.0000 | NASAL | Status: DC | PRN
  Filled 2021-07-29: qty 44

## 2021-07-29 MED ORDER — ONDANSETRON 4 MG PO TBDP: 4.0000 mg | ORAL_TABLET | Freq: Four times a day (QID) | ORAL | Status: DC | PRN

## 2021-07-29 MED ORDER — HALOPERIDOL 0.5 MG PO TABS: 0.5000 mg | ORAL_TABLET | ORAL | Status: DC | PRN

## 2021-07-29 MED ORDER — ACETAMINOPHEN 650 MG RE SUPP: 650.0000 mg | Freq: Four times a day (QID) | RECTAL | Status: DC | PRN

## 2021-07-29 MED ORDER — SODIUM CHLORIDE 0.9% FLUSH: 3.0000 mL | INTRAVENOUS | Status: DC | PRN

## 2021-07-29 MED ORDER — ONDANSETRON HCL 4 MG/2ML IJ SOLN: 4.0000 mg | Freq: Four times a day (QID) | INTRAMUSCULAR | Status: DC | PRN

## 2021-07-29 MED ORDER — HALOPERIDOL LACTATE 2 MG/ML PO CONC: 0.5000 mg | ORAL | Status: DC | PRN

## 2021-07-29 MED ORDER — SODIUM CHLORIDE 0.9% FLUSH: 3.0000 mL | Freq: Two times a day (BID) | INTRAVENOUS | Status: DC

## 2021-07-29 MED ORDER — ENSURE ENLIVE PO LIQD
237.0000 mL | Freq: Two times a day (BID) | ORAL | Status: DC
  Administered 2021-07-29 – 2021-07-31 (×4): 237 mL via ORAL

## 2021-07-29 NOTE — Progress Notes (Signed)
Progress Note    PHILIPPA VESSEY   ZOX:[id number]  DOB: [birth year]  DOA: 07/27/2021     1 PCP: Hoyt Koch, MD  Initial CC: weakness  Hospital Course: Ms. Booher is an 86 yo female with PMH Stave IV follicular B-cell lymphoma (in remission), MDS, HTN, GAD, tobacco abuse who presented with worsening weakness at home.  History was provided from family on admission.  She has continued to have a reported appropriate appetite but has had ongoing weight loss despite this with associated generalized weakness.  She also has been complaining of a frontal headache and some questionable vision changes.  She denied any nausea/vomiting.  Denied any blood in stools nor any abdominal pain.  Has had nasal drainage of brown/black mucus. She was last seen by oncology, Dr. Alvy Bimler in Sep 2020 with no signs of cancer recurrence at that time. She was discharged from oncology at that time as well.   On admission she underwent workup.  CT head showed a large heterogenous mass of the sella turcica and planum sphenoidale with erosion of the skull base and extension into the paranasal sinuses.  MRI brain then obtained which showed large mass centered in the nasal cavity extending into the paranasal sinuses and invading the intracranial compartment through the anterior skull base. CT chest/abdomen/pelvis was also then obtained for further staging which showed a 4.8 cm x 2.1 cm x 2.2 cm lobulated left lower lobe mass.  Stable diffuse circumferential thickening of the sigmoid junction.  Interval History:  I was able to have a really good and long discussion with family present bedside this morning.  They seem to be more understanding of her progressive decline over the past couple months and that she would not tolerate any significant cancer treatment well if at all. This afternoon, they were also able to meet with oncology, Dr. Alvy Bimler.  After this discussion, they were amenable for transitioning to full comfort  care and pursuing residential hospice placement.  Biopsies and consults were canceled.  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 Bilateral cavernous sinus invasion. Tumor extension to the left orbital apex with possible encasement of a portion of left optic nerve  - see lung mass discussion and GOC discussion - no attempts of biopsy at this time; instead will pursue comfort care  Generalized weakness - suspected multifactorial from underlying presumed new/recurrent malignancy and associated weight loss - she appears to have profound weakness which is likely to progress as she continues to be more lethargic today; intake still ~50% but suspect this will worsen in the near future - see [place] discussions below  Lung mass - new LLL mass noted on CT imaging; differential includes primary lung mass with mets to sinonasal cavity and erosion into anterior skull vs primary lesion is the sinonasal mass with lung mets vs recurrent lymphoma with spread (all would seem to bear poor prognosis regardless) - discussed with IR; mass is more amenable to possible bronch given that bronchus terminates into mass (but still high risk anesthesia for bronch so also not ideal). Then discussed with ENT for possible nasal biopsy but prior to this consult, the family was able to talk with Dr. Alvy Bimler in detail and ultimately decided on no biopsy and instead pursing comfort care/hospice  - at this time plan will be for trying to get patient into [facility]; will follow up with coordinators  Hypomagnesemia - No further lab checks in pursuit of comfort care  Hypokalemia - No further lab checks in pursuit of comfort care  GERD (gastroesophageal reflux disease) - Continue Pepcid  Essential hypertension - Continue losartan and Toprol  Tobacco abuse - Patient conveys that they are a current smoker, having smoked 0.5 ppd for the last  50 years .  Notable in the context of LLL mass - continue nicotine patch   Anxiety - Med rec complete.  Patient no longer on Cymbalta - Continue BuSpar   Old records reviewed in assessment of this patient  Antimicrobials:   DVT prophylaxis:    Comfort care   Code Status:   Code Status: DNR  Mobility Assessment (last 72 hours)     Mobility Assessment     Row Name 07/29/21 07:38:27 07/28/21 2136 07/28/21 1636 07/28/21 1500 07/28/21 1244   Does patient have an order for bedrest or is patient medically unstable No - Continue assessment No - Continue assessment -- -- No - Continue assessment   What is the highest level of mobility based on the progressive mobility assessment? Level 4 (Walks with assist in room) - Balance while marching in place and cannot step forward and back - Complete Level 4 (Walks with assist in room) - Balance while marching in place and cannot step forward and back - Complete Level 4 (Walks with assist in room) - Balance while marching in place and cannot step forward and back - Complete Level 4 (Walks with assist in room) - Balance while marching in place and cannot step forward and back - Complete Level 5 (Walks with assist in room/hall) - Balance while stepping forward/back and can walk in room with assist - Complete    Row Name 07/28/21 0758 07/28/21 0433         Does patient have an order for bedrest or is patient medically unstable No - Continue assessment No - Continue assessment      What is the highest level of mobility based on the progressive mobility assessment? -- Level 2 (Chairfast) - Balance while sitting on edge of bed and cannot stand               Disposition Plan:  Pending PT/OT Status is: Inpt  Objective: Blood pressure (!) 194/64, pulse (!) 56, temperature 98.4 F (36.9 C), temperature source Oral, resp. rate 16, height '5\' 1"'$  (1.549 m), weight 51.2 kg, SpO2 98 %.  Examination:  Physical Exam Constitutional:      Comments: Very  frail/weak and cachectic appearing elderly woman laying in bed with gross weakness   HENT:     Head: Normocephalic.     Nose:     Comments: Unable to visualize septum B/L due to profuse drainage after attempt at clearing; left nasal passage unable to move air thru    Mouth/Throat:     Mouth: Mucous membranes are dry.  Eyes:     Extraocular Movements: Extraocular movements intact.  Cardiovascular:     Rate and Rhythm: Normal rate and regular rhythm.  Pulmonary:     Effort: Pulmonary effort is normal.     Breath sounds: Normal breath sounds.  Abdominal:     General: Bowel sounds are normal. There is no distension.     Palpations: Abdomen is soft.  Musculoskeletal:        General: No swelling. Normal range of motion.     Cervical back: Normal range of motion and neck supple.  Skin:  General: Skin is warm and dry.  Neurological:     General: No focal deficit present.  Psychiatric:        Mood and Affect: Mood normal.      Consultants:  Pulm Oncology  Procedures:    Data Reviewed: Results for orders placed or performed during the hospital encounter of 07/27/21 (from the past 24 hour(s))  Basic metabolic panel     Status: Abnormal   Collection Time: 07/29/21  1:23 AM  Result Value Ref Range   Sodium 138 135 - 145 mmol/L   Potassium 3.2 (L) 3.5 - 5.1 mmol/L   Chloride 99 98 - 111 mmol/L   CO2 28 22 - 32 mmol/L   Glucose, Bld 86 70 - 99 mg/dL   BUN 6 (L) 8 - 23 mg/dL   Creatinine, Ser 0.65 0.44 - 1.00 mg/dL   Calcium 8.6 (L) 8.9 - 10.3 mg/dL   GFR, Estimated >60 >60 mL/min   Anion gap 11 5 - 15  CBC with Differential/Platelet     Status: None   Collection Time: 07/29/21  1:23 AM  Result Value Ref Range   WBC 7.4 4.0 - 10.5 K/uL   RBC 4.61 3.87 - 5.11 MIL/uL   Hemoglobin 14.2 12.0 - 15.0 g/dL   HCT 42.9 36.0 - 46.0 %   MCV 93.1 80.0 - 100.0 fL   MCH 30.8 26.0 - 34.0 pg   MCHC 33.1 30.0 - 36.0 g/dL   RDW 15.2 11.5 - 15.5 %   Platelets 218 150 - 400 K/uL   nRBC  0.0 0.0 - 0.2 %   Neutrophils Relative % 44 %   Neutro Abs 3.4 1.7 - 7.7 K/uL   Lymphocytes Relative 43 %   Lymphs Abs 3.2 0.7 - 4.0 K/uL   Monocytes Relative 10 %   Monocytes Absolute 0.8 0.1 - 1.0 K/uL   Eosinophils Relative 1 %   Eosinophils Absolute 0.1 0.0 - 0.5 K/uL   Basophils Relative 1 %   Basophils Absolute 0.0 0.0 - 0.1 K/uL   Immature Granulocytes 1 %   Abs Immature Granulocytes 0.04 0.00 - 0.07 K/uL  Magnesium     Status: None   Collection Time: 07/29/21  1:23 AM  Result Value Ref Range   Magnesium 1.9 1.7 - 2.4 mg/dL    I have Reviewed nursing notes, Vitals, and Lab results since pt's last encounter. Pertinent lab results : see above I have ordered test including BMP, CBC, Mg I have reviewed the last note from staff over past 24 hours I have discussed pt's care plan and test results with nursing staff, case manager   LOS: 1 day   Dwyane Dee, MD [facility] 07/29/2021, 6:12 PM

## 2021-07-29 NOTE — Consult Note (Addendum)
[place]  [phone] [phone]   MEDICAL ONCOLOGY - INITIAL CONSULTATION  Referral MD: Dr. Dwyane Dee I saw the patient, met with the family and reviewed plan of care extensively  Reason for Referral: Left lower lobe lung mass, thickening of the sigmoid junction, nasal cavity mass  HPI: [name] is an 86 year old female with a past medical history significant for history of follicular lymphoma in remission, MDS, hypertension, GAD, chronic tobacco abuse.  She presented to the emergency department with generalized weakness.  She experienced a 2-week history of generalized weakness and has been experiencing progressive rhinorrhea over that timeframe.  She was seen by her PCP and diagnosed with allergic rhinitis and started on Flonase.  CT head without contrast showed no acute intracranial hemorrhage but there was a large, heterogeneous mass of the sella turcica and planum sphenoidale with erosion of the skull base and extension into the paranasal sinuses.  MRI brain with and without contrast was performed which showed a large mass centered in the nasal cavity extending into the paranasal sinuses and invading the intracranial compartment through the anterior skull base, sinonasal undifferentiated carcinoma is a primary consideration but other considerations include esthesioneuroblastoma, sinonasal lymphoma, or less likely pituitary macroadenoma.  There is bilateral cavernous sinus invasion, left greater than right, tumor extension into the left orbital apex with possible encasement of a portion of the left optic nerve.  CT chest/abdomen/pelvis was performed which showed a 4.8 x 2.1 x 2.2 cm lobulated left lower lobe mass consistent with primary lung malignancy and diffuse circumferential thickening of the sigmoid junction.  Patient was seen by IR for consideration of lung biopsy.  They recommended evaluation by pulmonology for consideration of bronchoscopy.   Pulmonology has recommended ENT consult for sinus biopsy because risk of anesthesia is high.  I met with the patient in her hospital room.  Her grandson was at the bedside.  The patient reports that her appetite has been fair recently but she has lost a significant amount of weight.  She reports a headache today.  Tells me that she cannot stand up secondary to dizziness.  She has clear sinus drainage.  Denies chest pain and shortness of breath.  Denies abdominal pain, nausea, vomiting.  The patient smokes half a pack of cigarettes per day for the past 50 years.  Medical oncology was asked see the patient make recommendations regarding her abnormal imaging findings.  Past Medical History:  Diagnosis Date   Abnormal imaging of thyroid 04/06/2015   Anemia    Anxiety    Benign hypertension    CAD (coronary artery disease)    Chronic diarrhea    Constipation    Depressive disorder    DJD (degenerative joint disease)    Duodenitis    Dysphagia 2/94/7654   Follicular lymphoma ([place]) 10/07/2013   Gastritis    GERD (gastroesophageal reflux disease)    Hyperlipidemia    IBS (irritable bowel syndrome)    Insomnia 02/28/2014   Internal hemorrhoids    Lymphadenopathy 10/22/2012   Monoclonal gammopathy    Sore throat 04/06/2015   Urge incontinence    Vitamin B12 deficiency   :   Past Surgical History:  Procedure Laterality Date   ABDOMINAL HYSTERECTOMY  1992   with ovarian cystectomy   CORONARY STENT PLACEMENT     EYE SURGERY     cataract   HEMICOLECTOMY     ileocolonic anasomosis     SPINE SURGERY    :  Current Facility-Administered Medications  Medication Dose Route Frequency Provider Last Rate Last Admin   acetaminophen (TYLENOL) tablet 650 mg  650 mg Oral Q6H PRN Howerter, Justin B, DO       Or   acetaminophen (TYLENOL) suppository 650 mg  650 mg Rectal Q6H PRN Howerter, Justin B, DO       busPIRone (BUSPAR) tablet 5 mg  5 mg Oral BID Howerter, Justin B, DO   5 mg at 07/29/21 1015    famotidine (PEPCID) tablet 20 mg  20 mg Oral BID Howerter, Justin B, DO   20 mg at 07/29/21 1015   hydrALAZINE (APRESOLINE) injection 10 mg  10 mg Intravenous Q4H PRN Dwyane Dee, MD       labetalol (NORMODYNE) injection 10 mg  10 mg Intravenous Q4H PRN Dwyane Dee, MD       losartan (COZAAR) tablet 100 mg  100 mg Oral Daily Howerter, Justin B, DO   100 mg at 07/29/21 1015   metoprolol succinate (TOPROL-XL) 24 hr tablet 25 mg  25 mg Oral Daily Howerter, Justin B, DO   25 mg at 07/29/21 1015   nicotine (NICODERM CQ - dosed in mg/24 hours) patch 14 mg  14 mg Transdermal Daily PRN Howerter, Justin B, DO       Oral care mouth rinse  15 mL Mouth Rinse PRN Howerter, Justin B, DO          Allergies  Allergen Reactions   Cymbalta [Duloxetine Hcl] Other (See Comments)    Bad dreams  :   Family History  Problem Relation Age of Onset   Kidney disease Mother    Stroke Mother    Diabetes Other        sibling   Colon cancer Neg Hx   :   Social History   Socioeconomic History   Marital status: Married    Spouse name: Not on file   Number of children: 2   Years of education: Not on file   Highest education level: Not on file  Occupational History   Occupation: retired  Tobacco Use   Smoking status: Every Day    Packs/day: 0.50    Years: 50.00    Total pack years: 25.00    Types: Cigarettes   Smokeless tobacco: Never  Vaping Use   Vaping Use: Never used  Substance and Sexual Activity   Alcohol use: No   Drug use: No   Sexual activity: Not Currently  Other Topics Concern   Not on file  Social History Narrative   Not on file   Social Determinants of Health   Financial Resource Strain: Low Risk  (03/24/2021)   Overall Financial Resource Strain (CARDIA)    Difficulty of Paying Living Expenses: Not hard at all  Food Insecurity: No Food Insecurity (03/24/2021)   Hunger Vital Sign    Worried About Running Out of Food in the Last Year: Never true    Ran Out of Food in the  Last Year: Never true  Transportation Needs: No Transportation Needs (03/24/2021)   PRAPARE - Hydrologist (Medical): No    Lack of Transportation (Non-Medical): No  Physical Activity: Inactive (03/24/2021)   Exercise Vital Sign    Days of Exercise per Week: 0 days    Minutes of Exercise per Session: 0 min  Stress: No Stress Concern Present (03/24/2021)   [place]    Feeling of Stress : Not  at all  Social Connections: Moderately Integrated (03/24/2021)   Social Connection and Isolation Panel [NHANES]    Frequency of Communication with Friends and Family: More than three times a week    Frequency of Social Gatherings with Friends and Family: More than three times a week    Attends Religious Services: More than 4 times per year    Active Member of [employer] or Organizations: Yes    Attends Archivist Meetings: More than 4 times per year    Marital Status: Widowed  Intimate Partner Violence: Not At Risk (03/24/2021)   Humiliation, Afraid, Rape, and Kick questionnaire    Fear of Current or Ex-Partner: No    Emotionally Abused: No    Physically Abused: No    Sexually Abused: No  :  Review of Systems: A comprehensive 14 point review of systems was negative except as noted in the HPI.  Exam: Patient Vitals for the past 24 hrs:  BP Temp Temp src Pulse Resp SpO2 Weight  07/29/21 0738 (!) 194/64 98.4 F (36.9 C) Oral (!) 56 16 100 % --  07/29/21 0500 -- -- -- -- -- -- 51.2 kg  07/29/21 0341 (!) 187/75 98.5 F (36.9 C) Oral 66 18 97 % --  07/28/21 2103 (!) 168/83 98.4 F (36.9 C) Oral 70 19 98 % --  07/28/21 1148 (!) 182/65 -- -- 74 19 100 % --    General: Chronically ill-appearing female, no distress. Eyes:  no scleral icterus.   ENT: Clear nasal drainage present, no thrush or mucositis    Lymphatics:  Negative cervical, supraclavicular or axillary adenopathy.   Respiratory: lungs were  clear bilaterally without wheezing or crackles.   Cardiovascular:  Regular rate and rhythm, S1/S2, without murmur, rub or gallop.  There was no pedal edema.   GI:  abdomen was soft, flat, nontender, nondistended, without organomegaly.   Skin exam was without echymosis, petichae.   Neuro: The patient is alert, she is oriented to person place and time.   Lab Results  Component Value Date   WBC 7.4 07/29/2021   HGB 14.2 07/29/2021   HCT 42.9 07/29/2021   PLT 218 07/29/2021   GLUCOSE 86 07/29/2021   CHOL 206 (H) 07/16/2021   TRIG 167.0 (H) 07/16/2021   HDL 36.80 (L) 07/16/2021   LDLCALC 136 (H) 07/16/2021   ALT 10 07/28/2021   AST 17 07/28/2021   NA 138 07/29/2021   K 3.2 (L) 07/29/2021   CL 99 07/29/2021   CREATININE 0.65 07/29/2021   BUN 6 (L) 07/29/2021   CO2 28 07/29/2021   I personally reviewed imaging studies CT CHEST ABDOMEN PELVIS W CONTRAST  Result Date: 07/28/2021 CLINICAL DATA:  Metastatic disease evaluation. EXAM: CT CHEST, ABDOMEN, AND PELVIS WITH CONTRAST TECHNIQUE: Multidetector CT imaging of the chest, abdomen and pelvis was performed following the standard protocol during bolus administration of intravenous contrast. RADIATION DOSE REDUCTION: This exam was performed according to the departmental dose-optimization program which includes automated exposure control, adjustment of the mA and/or kV according to patient size and/or use of iterative reconstruction technique. CONTRAST:  <See Chart> OMNIPAQUE IOHEXOL 300 MG/ML SOLN, 1100m OMNIPAQUE IOHEXOL 300 MG/ML SOLN COMPARISON:  September 12, 2012 FINDINGS: CT CHEST FINDINGS Cardiovascular: There is moderate severity calcification of the aortic arch, without evidence of aortic aneurysm. Normal heart size with marked severity coronary artery calcification. No pericardial effusion. Mediastinum/Nodes: No enlarged mediastinal, hilar, or axillary lymph nodes. Multiple subcentimeter cystic appearing areas are seen within the  right and  left lobes of the thyroid gland. The trachea and esophagus demonstrate no significant findings. Lungs/Pleura: A 4.8 cm x 2.1 cm x 2.2 cm lobulated lung mass is seen within the posterior aspect of the left lower lobe. Mild lingular scarring and/or atelectasis is noted. There is no evidence of acute infiltrate, pleural effusion or pneumothorax. Musculoskeletal: Multilevel degenerative changes seen throughout the thoracic spine. CT ABDOMEN PELVIS FINDINGS Hepatobiliary: No focal liver abnormality is seen. No gallstones, gallbladder wall thickening, or biliary dilatation. Pancreas: Unremarkable. No pancreatic ductal dilatation or surrounding inflammatory changes. Spleen: Normal in size without focal abnormality. Adrenals/Urinary Tract: Adrenal glands are unremarkable. Kidneys are normal in size, without renal calculi or hydronephrosis. Multiple bilateral simple renal cysts are seen. The largest is located within the mid left kidney and measures approximately 5.8 cm x 6.7 cm. Bladder is unremarkable. Stomach/Bowel: Stomach is within normal limits. The appendix is not clearly identified. A very large amount of stool is seen throughout the colon. No evidence of bowel dilatation. There is stable diffuse circumferential thickening of the to sigmoid junction. Vascular/Lymphatic: Aortic atherosclerosis. No enlarged abdominal or pelvic lymph nodes. Reproductive: Status post hysterectomy. No adnexal masses. Other: No abdominal wall hernia or abnormality. No abdominopelvic ascites. Musculoskeletal: Multilevel degenerative changes seen throughout the lumbar spine. IMPRESSION: 1. 4.8 cm x 2.1 cm x 2.2 cm lobulated left lower lobe mass consistent with a primary lung malignancy. 2. Multiple bilateral simple renal cysts. 3. Stable diffuse circumferential thickening of the to sigmoid junction. Correlation with colonoscopy is recommended to exclude the presence of an underlying neoplastic process. Aortic Atherosclerosis (ICD10-I70.0).  Electronically Signed   By: Virgina Norfolk M.D.   On: 07/28/2021 01:31   MR BRAIN W WO CONTRAST  Result Date: 07/28/2021 CLINICAL DATA:  New anterior skull base mass. Headache and nose bleeds. EXAM: MRI HEAD WITHOUT AND WITH CONTRAST TECHNIQUE: Multiplanar, multiecho pulse sequences of the brain and surrounding structures were obtained without and with intravenous contrast. CONTRAST:  84m GADAVIST GADOBUTROL 1 MMOL/ML IV SOLN COMPARISON:  None Available. FINDINGS: Brain: No acute infarct, mass effect or extra-axial collection. Fewer than 5 scattered microhemorrhages in a nonspecific pattern. There is multifocal hyperintense T2-weighted signal within the white matter. Generalized volume loss. Multiple old small vessel infarcts of the basal ganglia corona radiata. There is a large mass centered at the sinonasal cavity that erodes through most of the anterior skull base and measures 4.4 x 6.0 x 5.6 cm. There is bilateral cavernous sinus invasion (grade 3 on the right and grade 4 on the left). Vascular: Cavernous carotid flow voids are preserved. There is mild mass effect on the proximal optic nerves. The left optic nerve appears to be encased by the mass, but it is difficult to be certain due to motion. There is extension to the left orbital apex and into both [place] Skull and upper cervical spine: Destruction of most of the anterior skull base. Visualized upper cervical spine is normal. Sinuses/Orbits:Above-described mass fills much of the ethmoid space. There is opacification of all paranasal sinuses. The mass invades the left orbital apex. IMPRESSION: 1. Large mass centered in the nasal cavity extending into the paranasal sinuses and invading the intracranial compartment through the anterior skull base. 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 Other possibilities include esthesioneuroblastoma, sinonasal lymphoma or, less likely, pituitary macroadenoma. 2. Bilateral cavernous  sinus invasion, left-greater-than-right, but the cavernous carotids remain patent. 3. Tumor extension to the left orbital apex with possible in case mint of  a portion of the left optic nerve. Electronically Signed   By: Ulyses Jarred M.D.   On: 07/28/2021 00:48   CT Head Wo Contrast  Result Date: 07/27/2021 CLINICAL DATA:  Encephalopathy EXAM: CT HEAD WITHOUT CONTRAST TECHNIQUE: Contiguous axial images were obtained from the base of the skull through the vertex without intravenous contrast. RADIATION DOSE REDUCTION: This exam was performed according to the departmental dose-optimization program which includes automated exposure control, adjustment of the mA and/or kV according to patient size and/or use of iterative reconstruction technique. COMPARISON:  None Available. FINDINGS: Brain: No acute hemorrhage. There is periventricular hypoattenuation compatible with chronic microvascular disease. Old right basal ganglia small vessel infarct. There is a mass of the sella turcica and anterior skull base that measures 4.0 cm AP x 3.1 cm TV x 3.4 cm CC . the mass extends through the floor of the anterior cranial fossa into the ethmoid sinus. Vascular: No abnormal hyperdensity of the major intracranial arteries or dural venous sinuses. No intracranial atherosclerosis. Skull: The visualized skull base, calvarium and extracranial soft tissues are normal. Sinuses/Orbits: Tumor extension into the paranasal sinuses, as mentioned above. The orbits are normal. IMPRESSION: 1. No acute intracranial hemorrhage. 2. 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 Primary considerations include macroadenoma, metastatic disease or aggressive meningioma. MRI with and without contrast is recommended for better characterization. Electronically Signed   By: Ulyses Jarred M.D.   On: 07/27/2021 21:47     CT CHEST ABDOMEN PELVIS W  CONTRAST  Result Date: 07/28/2021 CLINICAL DATA:  Metastatic disease evaluation. EXAM: CT CHEST, ABDOMEN, AND PELVIS WITH CONTRAST TECHNIQUE: Multidetector CT imaging of the chest, abdomen and pelvis was performed following the standard protocol during bolus administration of intravenous contrast. RADIATION DOSE REDUCTION: This exam was performed according to the departmental dose-optimization program which includes automated exposure control, adjustment of the mA and/or kV according to patient size and/or use of iterative reconstruction technique. CONTRAST:  <See Chart> OMNIPAQUE IOHEXOL 300 MG/ML SOLN, 113m OMNIPAQUE IOHEXOL 300 MG/ML SOLN COMPARISON:  September 12, 2012 FINDINGS: CT CHEST FINDINGS Cardiovascular: There is moderate severity calcification of the aortic arch, without evidence of aortic aneurysm. Normal heart size with marked severity coronary artery calcification. No pericardial effusion. Mediastinum/Nodes: No enlarged mediastinal, hilar, or axillary lymph nodes. Multiple subcentimeter cystic appearing areas are seen within the right and left lobes of the thyroid gland. The trachea and esophagus demonstrate no significant findings. Lungs/Pleura: A 4.8 cm x 2.1 cm x 2.2 cm lobulated lung mass is seen within the posterior aspect of the left lower lobe. Mild lingular scarring and/or atelectasis is noted. There is no evidence of acute infiltrate, pleural effusion or pneumothorax. Musculoskeletal: Multilevel degenerative changes seen throughout the thoracic spine. CT ABDOMEN PELVIS FINDINGS Hepatobiliary: No focal liver abnormality is seen. No gallstones, gallbladder wall thickening, or biliary dilatation. Pancreas: Unremarkable. No pancreatic ductal dilatation or surrounding inflammatory changes. Spleen: Normal in size without focal abnormality. Adrenals/Urinary Tract: Adrenal glands are unremarkable. Kidneys are normal in size, without renal calculi or hydronephrosis. Multiple bilateral simple renal  cysts are seen. The largest is located within the mid left kidney and measures approximately 5.8 cm x 6.7 cm. Bladder is unremarkable. Stomach/Bowel: Stomach is within normal limits. The appendix is not clearly identified. A very large amount of stool is seen throughout the colon. No evidence of bowel dilatation. There is stable diffuse circumferential thickening of the to  sigmoid junction. Vascular/Lymphatic: Aortic atherosclerosis. No enlarged abdominal or pelvic lymph nodes. Reproductive: Status post hysterectomy. No adnexal masses. Other: No abdominal wall hernia or abnormality. No abdominopelvic ascites. Musculoskeletal: Multilevel degenerative changes seen throughout the lumbar spine. IMPRESSION: 1. 4.8 cm x 2.1 cm x 2.2 cm lobulated left lower lobe mass consistent with a primary lung malignancy. 2. Multiple bilateral simple renal cysts. 3. Stable diffuse circumferential thickening of the to sigmoid junction. Correlation with colonoscopy is recommended to exclude the presence of an underlying neoplastic process. Aortic Atherosclerosis (ICD10-I70.0). Electronically Signed   By: Virgina Norfolk M.D.   On: 07/28/2021 01:31   MR BRAIN W WO CONTRAST  Result Date: 07/28/2021 CLINICAL DATA:  New anterior skull base mass. Headache and nose bleeds. EXAM: MRI HEAD WITHOUT AND WITH CONTRAST TECHNIQUE: Multiplanar, multiecho pulse sequences of the brain and surrounding structures were obtained without and with intravenous contrast. CONTRAST:  84m GADAVIST GADOBUTROL 1 MMOL/ML IV SOLN COMPARISON:  None Available. FINDINGS: Brain: No acute infarct, mass effect or extra-axial collection. Fewer than 5 scattered microhemorrhages in a nonspecific pattern. There is multifocal hyperintense T2-weighted signal within the white matter. Generalized volume loss. Multiple old small vessel infarcts of the basal ganglia corona radiata. There is a large mass centered at the sinonasal cavity that erodes through most of the anterior  skull base and measures 4.4 x 6.0 x 5.6 cm. There is bilateral cavernous sinus invasion (grade 3 on the right and grade 4 on the left). Vascular: Cavernous carotid flow voids are preserved. There is mild mass effect on the proximal optic nerves. The left optic nerve appears to be encased by the mass, but it is difficult to be certain due to motion. There is extension to the left orbital apex and into both [place] Skull and upper cervical spine: Destruction of most of the anterior skull base. Visualized upper cervical spine is normal. Sinuses/Orbits:Above-described mass fills much of the ethmoid space. There is opacification of all paranasal sinuses. The mass invades the left orbital apex. IMPRESSION: 1. Large mass centered in the nasal cavity extending into the paranasal sinuses and invading the intracranial compartment through the anterior skull base. 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 Other possibilities include esthesioneuroblastoma, sinonasal lymphoma or, less likely, pituitary macroadenoma. 2. Bilateral cavernous sinus invasion, left-greater-than-right, but the cavernous carotids remain patent. 3. Tumor extension to the left orbital apex with possible in case mint of a portion of the left optic nerve. Electronically Signed   By: KUlyses JarredM.D.   On: 07/28/2021 00:48   CT Head Wo Contrast  Result Date: 07/27/2021 CLINICAL DATA:  Encephalopathy EXAM: CT HEAD WITHOUT CONTRAST TECHNIQUE: Contiguous axial images were obtained from the base of the skull through the vertex without intravenous contrast. RADIATION DOSE REDUCTION: This exam was performed according to the departmental dose-optimization program which includes automated exposure control, adjustment of the mA and/or kV according to patient size and/or use of iterative reconstruction technique. COMPARISON:  None Available. FINDINGS: Brain: No acute hemorrhage. There is periventricular hypoattenuation compatible  with chronic microvascular disease. Old right basal ganglia small vessel infarct. There is a mass of the sella turcica and anterior skull base that measures 4.0 cm AP x 3.1 cm TV x 3.4 cm CC . the mass extends through the floor of the anterior cranial fossa into the ethmoid sinus. Vascular: No abnormal hyperdensity of the major intracranial arteries or dural venous sinuses. No intracranial atherosclerosis. Skull: The visualized skull base, calvarium and extracranial soft tissues  are normal. Sinuses/Orbits: Tumor extension into the paranasal sinuses, as mentioned above. The orbits are normal. IMPRESSION: 1. No acute intracranial hemorrhage. 2. 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 Primary considerations include macroadenoma, metastatic disease or aggressive meningioma. MRI with and without contrast is recommended for better characterization. Electronically Signed   By: Ulyses Jarred M.D.   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 Lymphoma is a consideration but less likely She has very poor performance status and this kind of disease is highly aggressive I do not recommend her to proceed with biopsy as there is no benign treatment available for her Biopsy can predispose her with high risk of bleeding, infection and injury and risk of seizure She is weak with poor performance status Ultimately, her family agrees not to pursue biopsy or treatment They agree with palliative care with comfort measures only  Tobacco Dependence Has continued to smoke about 1/2 pack of cigarettes per day  Smoking cessation recommended  Goals of care Imaging results concerning for metastatic malignancy Due to her overall poor performance status and poor nutritional  status may not be a candidate for systemic chemotherapy I discussed prognosis with the family It is likely going to be less than a month, possibly shorter if her intake declines further I recommend comfort measures with no further biopsy, investigation, blood draw, vital signs check and others I recommend transition of care to comfort measures and they are in agreement I recommend residential hospice facility.  The family is not able to take care of her at home I will sign off I have addressed all the questions    Thank you for this referral.   Samantha Bussing, DNP, AGPCNP-BC, AOCNP  [name] [name]

## 2021-07-29 NOTE — Progress Notes (Signed)
Nutrition Brief Note  Chart reviewed. Pt now transitioning to comfort care.  No further nutrition interventions planned at this time.  Please re-consult as needed.   Allie Everette Dimauro, RDN, LDN Clinical Nutrition  

## 2021-07-29 NOTE — Progress Notes (Signed)
Addendum to conversation earlier, we discussed CODE STATUS All her family members agreed to change her CODE STATUS to DO NOT RESUSCITATE

## 2021-07-30 DIAGNOSIS — J3489 Other specified disorders of nose and nasal sinuses: Secondary | ICD-10-CM | POA: Diagnosis not present

## 2021-07-30 DIAGNOSIS — F419 Anxiety disorder, unspecified: Secondary | ICD-10-CM

## 2021-07-30 DIAGNOSIS — R531 Weakness: Secondary | ICD-10-CM | POA: Diagnosis not present

## 2021-07-30 DIAGNOSIS — R918 Other nonspecific abnormal finding of lung field: Secondary | ICD-10-CM | POA: Diagnosis not present

## 2021-07-30 DIAGNOSIS — Z66 Do not resuscitate: Secondary | ICD-10-CM

## 2021-07-30 MED ORDER — LORAZEPAM 2 MG/ML IJ SOLN
0.5000 mg | Freq: Four times a day (QID) | INTRAMUSCULAR | Status: DC | PRN
Start: 2021-07-30 — End: 2021-08-01

## 2021-07-30 MED ORDER — SENNOSIDES-DOCUSATE SODIUM 8.6-50 MG PO TABS
1.0000 | ORAL_TABLET | Freq: Two times a day (BID) | ORAL | Status: DC
  Administered 2021-07-30 – 2021-07-31 (×3): 1 via ORAL
  Filled 2021-07-30 (×3): qty 1

## 2021-07-30 MED ORDER — MORPHINE SULFATE (PF) 2 MG/ML IV SOLN: 1.0000 mg | INTRAVENOUS | Status: DC | PRN

## 2021-07-30 MED ORDER — POLYETHYLENE GLYCOL 3350 17 G PO PACK
17.0000 g | PACK | Freq: Every day | ORAL | Status: DC
  Administered 2021-07-30 – 2021-07-31 (×2): 17 g via ORAL
  Filled 2021-07-30 (×2): qty 1

## 2021-07-30 MED ORDER — OXYCODONE HCL 5 MG PO TABS: 5.0000 mg | ORAL_TABLET | Freq: Four times a day (QID) | ORAL | Status: DC | PRN

## 2021-07-30 MED ORDER — BISACODYL 10 MG RE SUPP: 10.0000 mg | Freq: Every day | RECTAL | Status: DC | PRN

## 2021-07-30 NOTE — Plan of Care (Signed)

## 2021-07-30 NOTE — Discharge Summary (Signed)
Physician Discharge Summary   [name] OIN:[id number] DOB: 10-02-1935 DOA: 07/27/2021  PCP: Hoyt Koch, MD  Admit date: 07/27/2021 Discharge date: 07/30/2021  Admitted From: Home Disposition:  [place] Discharging physician: Dwyane Dee, MD  Recommendations for Outpatient Follow-up:  Continue comfort care  Discharge Condition: poor CODE STATUS: DNR Diet recommendation:  Diet Orders (From admission, onward)     Start     Ordered   07/30/21 0000  Diet general        07/30/21 1320   07/28/21 0212  Diet regular Room service appropriate? Yes; Fluid consistency: Thin  Diet effective now       Question Answer Comment  Room service appropriate? Yes   Fluid consistency: Thin      07/28/21 0211            Hospital Course: [name] is an 86 yo female with PMH Stave IV follicular B-cell lymphoma (in remission), MDS, HTN, GAD, tobacco abuse who presented with worsening weakness at home.  History was provided from family on admission.  She has continued to have a reported appropriate appetite but has had ongoing weight loss despite this with associated generalized weakness.  She also has been complaining of a frontal headache and some questionable vision changes.  She denied any nausea/vomiting.  Denied any blood in stools nor any abdominal pain.  Has had nasal drainage of brown/black mucus. She was last seen by oncology, Dr. Alvy Bimler in Sep 2020 with no signs of cancer recurrence at that time. She was discharged from oncology at that time as well.   On admission she underwent workup.  CT head showed a large heterogenous mass of the sella turcica and planum sphenoidale with erosion of the skull base and extension into the paranasal sinuses.  MRI brain then obtained which showed large mass centered in the nasal cavity extending into the paranasal sinuses and invading the intracranial compartment through the anterior skull base. CT chest/abdomen/pelvis was also then  obtained for further staging which showed a 4.8 cm x 2.1 cm x 2.2 cm lobulated left lower lobe mass.  Stable diffuse circumferential thickening of the sigmoid junction.  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 Bilateral cavernous sinus invasion. Tumor extension to the left orbital apex with possible encasement of a portion of left optic nerve  - see lung mass discussion and GOC discussion - no attempts of biopsy at this time; instead will pursue comfort care  Generalized weakness - suspected multifactorial from underlying presumed new/recurrent malignancy and associated weight loss - she appears to have profound weakness which is likely to progress as she continues to be more lethargic today; intake still ~50% but suspect this will worsen in the near future - see [place] discussions below  Lung mass - new LLL mass noted on CT imaging; differential includes primary lung mass with mets to sinonasal cavity and erosion into anterior skull vs primary lesion is the sinonasal mass with lung mets vs recurrent lymphoma with spread (all would seem to bear poor prognosis regardless) - discussed with IR; mass is more amenable to possible bronch given that bronchus terminates into mass (but still high risk anesthesia for bronch so also not ideal). Then discussed with ENT for possible nasal biopsy but prior to this consult, the family was able to talk with Dr. Alvy Bimler in detail and ultimately decided on no biopsy and instead  pursing comfort care/hospice  -Plan is for [facility]  Hypomagnesemia - No further lab checks in pursuit of comfort care  Hypokalemia - No further lab checks in pursuit of comfort care  GERD (gastroesophageal reflux disease) - asymptomatic   Essential hypertension - Continue losartan and Toprol  Tobacco abuse - Patient conveys that they are a current smoker, having smoked 0.5  ppd for the last 50 years .  Notable in the context of LLL mass - continue nicotine patch if needed  Anxiety - Med rec complete.  Patient no longer on Cymbalta - Continue BuSpar      Principal Diagnosis: Mass of nasal sinus  Discharge Diagnoses: Active Hospital Problems   Diagnosis Date Noted   Mass of nasal sinus 07/28/2021    Priority: 2.   Generalized weakness 07/28/2021    Priority: 1.   Lung mass 07/28/2021    Priority: 2.   Hypomagnesemia 07/28/2021   Hypokalemia 05/18/2017   GERD (gastroesophageal reflux disease) 03/19/2007   Anxiety 03/23/2006   Essential hypertension 03/23/2006   Tobacco abuse 03/23/2006    Resolved Hospital Problems  No resolved problems to display.     Discharge Instructions     Diet general   Complete by: As directed    Increase activity slowly   Complete by: As directed       Allergies as of 07/30/2021       Reactions   Cymbalta [duloxetine Hcl] Other (See Comments)   Bad dreams        Medication List     STOP taking these medications    acetaminophen 500 MG tablet Commonly known as: TYLENOL   amoxicillin-clavulanate 875-125 MG tablet Commonly known as: AUGMENTIN   aspirin 81 MG tablet   B-12 PO   DULoxetine 60 MG capsule Commonly known as: Cymbalta   famotidine 20 MG tablet Commonly known as: PEPCID   fluticasone 50 MCG/ACT nasal spray Commonly known as: FLONASE   HYDROcodone-acetaminophen 10-325 MG tablet Commonly known as: NORCO   mirtazapine 7.5 MG tablet Commonly known as: REMERON   nitroGLYCERIN 0.4 MG/SPRAY spray Commonly known as: NITROLINGUAL   SUMAtriptan 50 MG tablet Commonly known as: Imitrex   Vitamin D (Ergocalciferol) 1.25 MG (50000 UNIT) Caps capsule Commonly known as: DRISDOL       TAKE these medications    busPIRone 5 MG tablet Commonly known as: BUSPAR TAKE 1 TABLET(5 MG) BY MOUTH TWICE DAILY What changed: See the new instructions.   losartan 100 MG tablet Commonly known  as: COZAAR Take 100 mg by mouth daily.   metoprolol succinate 25 MG 24 hr tablet Commonly known as: TOPROL-XL 25 mg daily.        Allergies  Allergen Reactions   Cymbalta [Duloxetine Hcl] Other (See Comments)    Bad dreams    Consultations: Oncology  Procedures:   Discharge Exam: BP (!) 176/77 (BP Location: Left Arm)   Pulse (!) 57   Temp (!) 97.5 F (36.4 C) (Axillary)   Resp 17   Ht '5\' 1"'$  (1.549 m)   Wt 51.2 kg   SpO2 100%   BMI 21.33 kg/m  Physical Exam Constitutional:      Comments: Very frail/weak and cachectic appearing elderly woman laying in bed with gross weakness   HENT:     Head: Normocephalic.     Nose:     Comments: Unable to visualize septum B/L due to profuse drainage after attempt at clearing; left nasal passage unable to move  air thru    Mouth/Throat:     Mouth: Mucous membranes are dry.  Eyes:     Extraocular Movements: Extraocular movements intact.  Cardiovascular:     Rate and Rhythm: Normal rate and regular rhythm.  Pulmonary:     Effort: Pulmonary effort is normal.     Breath sounds: Normal breath sounds.  Abdominal:     General: Bowel sounds are normal. There is no distension.     Palpations: Abdomen is soft.  Musculoskeletal:        General: No swelling. Normal range of motion.     Cervical back: Normal range of motion and neck supple.  Skin:    General: Skin is warm and dry.  Neurological:     Mental Status: Mental status is at baseline.  Psychiatric:        Mood and Affect: Mood normal.      The results of significant diagnostics from this hospitalization (including imaging, microbiology, ancillary and laboratory) are listed below for reference.   Microbiology: No results found for this or any previous visit (from the past 240 hour(s)).   Labs: BNP (last 3 results) No results for input(s): "BNP" in the last 8760 hours. Basic Metabolic Panel: Recent Labs  Lab 07/27/21 2051 07/28/21 0453 07/29/21 0123  NA 140 144  138  K 2.8* 3.3* 3.2*  CL 102 106 99  CO2 '28 26 28  '$ GLUCOSE 100* 79 86  BUN 12 8 6*  CREATININE 0.84 0.66 0.65  CALCIUM 8.4* 8.7* 8.6*  MG 1.6* 2.2 1.9   Liver Function Tests: Recent Labs  Lab 07/27/21 2051 07/28/21 0453  AST 16 17  ALT 11 10  ALKPHOS 70 80  BILITOT 0.6 0.8  PROT 6.8 7.0  ALBUMIN 3.0* 2.9*   No results for input(s): "LIPASE", "AMYLASE" in the last 168 hours. No results for input(s): "AMMONIA" in the last 168 hours. CBC: Recent Labs  Lab 07/27/21 2051 07/28/21 0453 07/29/21 0123  WBC 7.5 7.1 7.4  NEUTROABS  --  3.4 3.4  HGB 11.7* 14.7 14.2  HCT 34.7* 42.5 42.9  MCV 93.0 91.0 93.1  PLT 261 259 218   Cardiac Enzymes: No results for input(s): "CKTOTAL", "CKMB", "CKMBINDEX", "TROPONINI" in the last 168 hours. BNP: Invalid input(s): "POCBNP" CBG: Recent Labs  Lab 07/27/21 2057  GLUCAP 98   D-Dimer No results for input(s): "DDIMER" in the last 72 hours. Hgb A1c No results for input(s): "HGBA1C" in the last 72 hours. Lipid Profile No results for input(s): "CHOL", "HDL", "LDLCALC", "TRIG", "CHOLHDL", "LDLDIRECT" in the last 72 hours. Thyroid function studies No results for input(s): "TSH", "T4TOTAL", "T3FREE", "THYROIDAB" in the last 72 hours.  Invalid input(s): "FREET3" Anemia work up No results for input(s): "VITAMINB12", "FOLATE", "FERRITIN", "TIBC", "IRON", "RETICCTPCT" in the last 72 hours. Urinalysis    Component Value Date/Time   COLORURINE STRAW (A) 07/27/2021 2054   APPEARANCEUR CLEAR 07/27/2021 2054   LABSPEC 1.004 (L) 07/27/2021 2054   PHURINE 6.0 07/27/2021 2054   GLUCOSEU NEGATIVE 07/27/2021 2054   HGBUR SMALL (A) 07/27/2021 2054   BILIRUBINUR NEGATIVE 07/27/2021 2054   [place] NEGATIVE 07/27/2021 2054   PROTEINUR NEGATIVE 07/27/2021 2054   NITRITE NEGATIVE 07/27/2021 2054   LEUKOCYTESUR NEGATIVE 07/27/2021 2054   Sepsis Labs Recent Labs  Lab 07/27/21 2051 07/28/21 0453 07/29/21 0123  WBC 7.5 7.1 7.4    Microbiology No results found for this or any previous visit (from the past 240 hour(s)).  Procedures/Studies: CT CHEST ABDOMEN PELVIS  W CONTRAST  Result Date: 07/28/2021 CLINICAL DATA:  Metastatic disease evaluation. EXAM: CT CHEST, ABDOMEN, AND PELVIS WITH CONTRAST TECHNIQUE: Multidetector CT imaging of the chest, abdomen and pelvis was performed following the standard protocol during bolus administration of intravenous contrast. RADIATION DOSE REDUCTION: This exam was performed according to the departmental dose-optimization program which includes automated exposure control, adjustment of the mA and/or kV according to patient size and/or use of iterative reconstruction technique. CONTRAST:  <See Chart> OMNIPAQUE IOHEXOL 300 MG/ML SOLN, 139m OMNIPAQUE IOHEXOL 300 MG/ML SOLN COMPARISON:  September 12, 2012 FINDINGS: CT CHEST FINDINGS Cardiovascular: There is moderate severity calcification of the aortic arch, without evidence of aortic aneurysm. Normal heart size with marked severity coronary artery calcification. No pericardial effusion. Mediastinum/Nodes: No enlarged mediastinal, hilar, or axillary lymph nodes. Multiple subcentimeter cystic appearing areas are seen within the right and left lobes of the thyroid gland. The trachea and esophagus demonstrate no significant findings. Lungs/Pleura: A 4.8 cm x 2.1 cm x 2.2 cm lobulated lung mass is seen within the posterior aspect of the left lower lobe. Mild lingular scarring and/or atelectasis is noted. There is no evidence of acute infiltrate, pleural effusion or pneumothorax. Musculoskeletal: Multilevel degenerative changes seen throughout the thoracic spine. CT ABDOMEN PELVIS FINDINGS Hepatobiliary: No focal liver abnormality is seen. No gallstones, gallbladder wall thickening, or biliary dilatation. Pancreas: Unremarkable. No pancreatic ductal dilatation or surrounding inflammatory changes. Spleen: Normal in size without focal abnormality. Adrenals/Urinary  Tract: Adrenal glands are unremarkable. Kidneys are normal in size, without renal calculi or hydronephrosis. Multiple bilateral simple renal cysts are seen. The largest is located within the mid left kidney and measures approximately 5.8 cm x 6.7 cm. Bladder is unremarkable. Stomach/Bowel: Stomach is within normal limits. The appendix is not clearly identified. A very large amount of stool is seen throughout the colon. No evidence of bowel dilatation. There is stable diffuse circumferential thickening of the to sigmoid junction. Vascular/Lymphatic: Aortic atherosclerosis. No enlarged abdominal or pelvic lymph nodes. Reproductive: Status post hysterectomy. No adnexal masses. Other: No abdominal wall hernia or abnormality. No abdominopelvic ascites. Musculoskeletal: Multilevel degenerative changes seen throughout the lumbar spine. IMPRESSION: 1. 4.8 cm x 2.1 cm x 2.2 cm lobulated left lower lobe mass consistent with a primary lung malignancy. 2. Multiple bilateral simple renal cysts. 3. Stable diffuse circumferential thickening of the to sigmoid junction. Correlation with colonoscopy is recommended to exclude the presence of an underlying neoplastic process. Aortic Atherosclerosis (ICD10-I70.0). Electronically Signed   By: TVirgina NorfolkM.D.   On: 07/28/2021 01:31   MR BRAIN W WO CONTRAST  Result Date: 07/28/2021 CLINICAL DATA:  New anterior skull base mass. Headache and nose bleeds. EXAM: MRI HEAD WITHOUT AND WITH CONTRAST TECHNIQUE: Multiplanar, multiecho pulse sequences of the brain and surrounding structures were obtained without and with intravenous contrast. CONTRAST:  557mGADAVIST GADOBUTROL 1 MMOL/ML IV SOLN COMPARISON:  None Available. FINDINGS: Brain: No acute infarct, mass effect or extra-axial collection. Fewer than 5 scattered microhemorrhages in a nonspecific pattern. There is multifocal hyperintense T2-weighted signal within the white matter. Generalized volume loss. Multiple old small vessel  infarcts of the basal ganglia corona radiata. There is a large mass centered at the sinonasal cavity that erodes through most of the anterior skull base and measures 4.4 x 6.0 x 5.6 cm. There is bilateral cavernous sinus invasion (grade 3 on the right and grade 4 on the left). Vascular: Cavernous carotid flow voids are preserved. There is mild mass effect on the proximal optic  nerves. The left optic nerve appears to be encased by the mass, but it is difficult to be certain due to motion. There is extension to the left orbital apex and into both [place]. Skull and upper cervical spine: Destruction of most of the anterior skull base. Visualized upper cervical spine is normal. Sinuses/Orbits:Above-described mass fills much of the ethmoid space. There is opacification of all paranasal sinuses. The mass invades the left orbital apex. IMPRESSION: 1. Large mass centered in the nasal cavity extending into the paranasal sinuses and invading the intracranial compartment through the anterior skull base. 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 Other possibilities include esthesioneuroblastoma, sinonasal lymphoma or, less likely, pituitary macroadenoma. 2. Bilateral cavernous sinus invasion, left-greater-than-right, but the cavernous carotids remain patent. 3. Tumor extension to the left orbital apex with possible in case mint of a portion of the left optic nerve. Electronically Signed   By: Ulyses Jarred M.D.   On: 07/28/2021 00:48   CT Head Wo Contrast  Result Date: 07/27/2021 CLINICAL DATA:  Encephalopathy EXAM: CT HEAD WITHOUT CONTRAST TECHNIQUE: Contiguous axial images were obtained from the base of the skull through the vertex without intravenous contrast. RADIATION DOSE REDUCTION: This exam was performed according to the departmental dose-optimization program which includes automated exposure control, adjustment of the mA and/or kV according to patient size and/or use of iterative  reconstruction technique. COMPARISON:  None Available. FINDINGS: Brain: No acute hemorrhage. There is periventricular hypoattenuation compatible with chronic microvascular disease. Old right basal ganglia small vessel infarct. There is a mass of the sella turcica and anterior skull base that measures 4.0 cm AP x 3.1 cm TV x 3.4 cm CC . the mass extends through the floor of the anterior cranial fossa into the ethmoid sinus. Vascular: No abnormal hyperdensity of the major intracranial arteries or dural venous sinuses. No intracranial atherosclerosis. Skull: The visualized skull base, calvarium and extracranial soft tissues are normal. Sinuses/Orbits: Tumor extension into the paranasal sinuses, as mentioned above. The orbits are normal. IMPRESSION: 1. No acute intracranial hemorrhage. 2. 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 Primary considerations include macroadenoma, metastatic disease or aggressive meningioma. MRI with and without contrast is recommended for better characterization. Electronically Signed   By: Ulyses Jarred M.D.   On: 07/27/2021 21:47     Time coordinating discharge: Over 30 minutes    Dwyane Dee, MD  [facility] 07/30/2021, 1:22 PM

## 2021-07-30 NOTE — Progress Notes (Signed)
Palliative Care Progress Note, Assessment & Plan   Patient Name: [name]       Date: 07/30/2021 DOB: January 16, 1936  Age: 86 y.o. MRN#: [id number] Attending Physician: Dwyane Dee, MD Primary Care Physician: Hoyt Koch, MD Admit Date: 07/27/2021  Reason for Consultation/Follow-up: Establishing goals of care  Subjective: Patient is lying in bed in no apparent distress.  She does not acknowledge my presence but arouses easily to physical touch.  She is able to make her wishes known.  Her daughter and granddaughter are at bedside.  HPI: 86 y.o. female  with past medical history of follicular lymphoma (in remission), MDS, GAD, HTN, hx of MGUS, DJD, IBS, fall with nasal bone fracture (11/2020), vitamin B12 deficiency, and chronic tobacco abuse (half PPD x50 years) admitted on 07/27/2021 with complaints of generalized weakness for approximately 2 weeks.   PTA, patient was experiencing progressive rhinitis rhinorrhea, was diagnosed with allergic rhinitis by PCP, and had been using intranasal Flonase daily without relief of symptoms.  CT of head revealed a large heterogeneous mass of the sella turcica and plan a sphenoid alley with aeration of the skull base.  MRI of the brain revealed large mass centered in the nasal cavity extending into the paranasal sinuses and invading the intracranial compartment through the anterior skull base.  CT of chest, abdomen, and pelvis with IV contrast revealed a lobulated left lower lobe mass consistent with primary lung malignancy as well as diffuse circumferential thickening of the sigmoid colon. Underlying neoplastic process/metastatic disease is suspected.  Medical oncology evaluated patient and patient is not a candidate for systemic chemotherapy.  Given her poor  performance status and poor nutritional status, metabolic recommended comfort measures.  Family was in agreement to transition to comfort care measures.   Palliative medicine team was consulted to discuss goals of care.  Summary of counseling/coordination of care: After reviewing the patient's chart and assessing the patient at bedside, I spoke with patient's family in regards to disposition.  Family is in agreement to have patient evaluated for hospice inpatient unit.  They share they have difficulty with transportation and would prefer patient to be in [place].  I shared that social worker will be available to family to offer choice of hospice services.  Therapeutic silence and active listening provided for family to share their thoughts and emotions regarding patient's current health status.  Discussed use of medications to provide relief and aggressive symptom management.  Patient complained of feeling anxious.  She says she is "all up in my head".  Also discussed pain management.  Patient vocalized she feels a burning and painful sensation on the tops of both of her legs.  Discussed use of Ativan to help relieve anxiety as needed.  Also discussed use of Oxy IR p.o. for better pain control with use of IV medication for breakthrough/severe pain.  Family was in agreement and appreciated these recommendations.  Patient's great granddaughter would like to visit.  Unrestricted visitor access added to chart and discussed with RN to allow unlimited visitors as patient is end-of-life.  Questions and concerns were addressed.  PMT will continue to follow the patient throughout her hospitalization.  Of note, patient had complained to  family in regards to interactions with night shift staff.  Patient's complaints were made known to dayshift RN, day charge nurse, and Surveyor, quantity.  Code Status: DNR  Prognosis: < 2 weeks  Discharge Planning: Hospice facility  Care plan was discussed  with patient, patient's granddaughter Dionza, patient's daughter Nevin Bloodgood, RN Izora Gala, [place], Charge RN Ben  Physical Exam Vitals reviewed.  Constitutional:      Comments: cachectic  HENT:     Head: Normocephalic.     Comments: Left eye mild edema, not tender to touch    Mouth/Throat:     Mouth: Mucous membranes are moist.  Cardiovascular:     Rate and Rhythm: Normal rate.     Pulses: Normal pulses.  Pulmonary:     Effort: Pulmonary effort is normal.  Abdominal:     Palpations: Abdomen is soft.  Musculoskeletal:     Comments: Generalized weakness  Neurological:     Mental Status: She is alert.     Comments: Oriented to self and place  Psychiatric:        Mood and Affect: Mood normal.        Behavior: Behavior normal.             Palliative Assessment/Data: 30%    Total Time 50 minutes  Greater than 50%  of this time was spent counseling and coordinating care related to the above assessment and plan.  Thank you for allowing the Palliative Medicine Team to assist in the care of this patient.  Diamond Ilsa Iha, FNP-BC Palliative Medicine Team Team Phone # [phone]

## 2021-07-30 NOTE — TOC Initial Note (Addendum)
Transition of Care [facility]) - Initial/Assessment Note    Patient Details  Name: [name] MRN: [id number] Date of Birth: 1935-03-25  Transition of Care [facility]) CM/SW Contact:    Marilu Favre, RN Phone Number: 07/30/2021, 11:12 AM  Clinical Narrative:                 Spoke to daughter Nevin Bloodgood ans grand daughter at bedside.   Discussed residential hospice. Both in agreement. Provided choice. They would like [facility]. Explained referral process and NCM will make referral to [facility]. Contact people for [employer] are Nevin Bloodgood or son Evette Doffing. NCM confirmed numbers.    NCM spoke with [place] with [facility] . Await determination.   1250 Patient has been approved for [facility]. [facility] contacting family to complete paperwork. Once completed NCM will call PTAR. PTAR paperwork and DNR form on chart. MD and nurse aware. Nurse to call report to [facility] at [phone]    1430 Per family they cannot meet until tomorrow at noon to complete consents as they have a family emergency to attend to. Consents must be signed prior to transport  Expected Discharge Plan: [place]     Patient Goals and CMS Choice        Expected Discharge Plan and Services Expected Discharge Plan: [place]       Living arrangements for the past 2 months: Single Family Home                                      Prior Living Arrangements/Services Living arrangements for the past 2 months: Single Family Home                Current home services: DME    Activities of Daily Living      Permission Sought/Granted                  Emotional Assessment              Admission diagnosis:  Lung mass [R91.8] Nasal mass [J34.89] Mass of nasal sinus [J34.89] Patient Active Problem List   Diagnosis Date Noted   Mass of nasal sinus 07/28/2021   Lung mass 07/28/2021   Hypomagnesemia 07/28/2021   Generalized weakness 07/28/2021   Opioid withdrawal  ([place]) 07/16/2021   Constipation 06/20/2017   Acute low back pain 06/20/2017   Hypokalemia 05/18/2017   Hyperglycemia 05/18/2017   Lumbar radiculopathy 04/25/2017   Chronic pain syndrome 04/06/2017   Trigger finger of right hand 02/24/2017   Mass of left forearm 06/09/2016   Routine general medical examination at a health care facility 05/29/2016   Abnormal imaging of thyroid 04/06/2015   Dysphagia 04/06/2015   Chronic musculoskeletal pain 03/26/2014   Insomnia 02/28/2014   History of B-cell lymphoma 10/07/2013   B12 deficiency 10/25/2007   GERD (gastroesophageal reflux disease) 03/19/2007   IBS 03/19/2007   Migraine 03/19/2007   MONOCLONAL GAMMOPATHY 01/01/2007   Anxiety 03/23/2006   Tobacco abuse 03/23/2006   HEARING LOSS NOS OR DEAFNESS 03/23/2006   Essential hypertension 03/23/2006   INCONTINENCE, URGE 03/23/2006   PCP:  Hoyt Koch, MD Pharmacy:   [facility] [place], [place] AT [address] [place] [address] Phone: [phone] Fax: [phone]     Social Determinants of Health (SDOH) Interventions    Readmission  Risk Interventions     No data to display

## 2021-07-30 NOTE — Progress Notes (Addendum)
Manufacturing [employer] [facility]) Hospital Liaison Note  Received request from [facility] [place], RN for family interest in [facility]. Visited patient at bedside and spoke with Nevin Bloodgood & Evette Doffing to confirm interest and explain services.  Approval for [facility] is determined by [facility] MD. Once [facility] MD has determined [facility] eligibility, [place] will update hospital staff and family. Eligibility approved.  Please do not hesitate to call with any hospice related questions.   PTAR to be notified of patient D/C and transport to be arranged 7/8, pending bed availability per family request. TOC and Attending Physician/Dr. Girguis aware of above updates.   Please send signed DNR form with patient and RN call report to [phone].    Daphene Calamity, MSW [facility] Liaison [phone]

## 2021-07-31 DIAGNOSIS — R69 Illness, unspecified: Secondary | ICD-10-CM | POA: Diagnosis not present

## 2021-07-31 DIAGNOSIS — R4182 Altered mental status, unspecified: Secondary | ICD-10-CM | POA: Diagnosis not present

## 2021-07-31 DIAGNOSIS — R410 Disorientation, unspecified: Secondary | ICD-10-CM | POA: Diagnosis not present

## 2021-07-31 DIAGNOSIS — R58 Hemorrhage, not elsewhere classified: Secondary | ICD-10-CM | POA: Diagnosis not present

## 2021-07-31 DIAGNOSIS — Z515 Encounter for palliative care: Secondary | ICD-10-CM | POA: Diagnosis not present

## 2021-07-31 DIAGNOSIS — Z7401 Bed confinement status: Secondary | ICD-10-CM | POA: Diagnosis not present

## 2021-07-31 NOTE — Progress Notes (Signed)
[facility])   Consent forms to be completed by family today, 7/8.  Once complete, PTAR to notified of patient D/C. TOC/Wendi and Attending Physician/Dr. Girguis aware of above updates.    Please send signed DNR form with patient and RN call report to [phone].    Daphene Calamity, MSW [facility] Liaison [phone]

## 2021-07-31 NOTE — Plan of Care (Signed)

## 2021-07-31 NOTE — Plan of Care (Signed)
Patient seen and rounded on. Planning to d/c today to [facility]. Family was unable to sign consents until today. No change in discharge plan from yesterday.  Dwyane Dee, MD [facility] 07/31/2021, 12:46 PM

## 2021-07-31 NOTE — Progress Notes (Signed)
Patient ID: [name], female   DOB: 1935/10/16, 86 y.o.   MRN: [id number] Report given to [facility] at [facility] after multiple attempts to contact. RN asked about narcotic use because reported EMS driver stated patient was there for opoid withdrawal. Informed nursing staff patient was here for nasal mass and was there for end of life care. No further questions at this time.  Haydee Salter, RN

## 2021-07-31 NOTE — Progress Notes (Signed)
Palliative Medicine Inpatient Follow Up Note  HPI: 86 y.o. female  with past medical history of follicular lymphoma (in remission), MDS, GAD, HTN, hx of MGUS, DJD, IBS, fall with nasal bone fracture (11/2020), vitamin B12 deficiency, and chronic tobacco abuse (half PPD x50 years) admitted on 07/27/2021 with complaints of generalized weakness for approximately 2 weeks. (+) Nasal and lung malignancy. Palliative medicine team was consulted to discuss goals of care.  Today's Discussion 07/31/2021  *Please note that this is a verbal dictation therefore any spelling or grammatical errors are due to the "Dragon Medical One" system interpretation.  Chart reviewed inclusive of vital signs, progress notes, laboratory results, and diagnostic images.   Per medication review has not required any Ativan, oxycodone, or morphine in the last 24 hours.  I met with Lynann at bedside this morning. There was no family present though Angelys appeared to be comfortable. Upon exam noted to have cool distal extremities. I suspect time will be limited hours to days at the most. She presently does appear stable for transition to [facility] if a bed becomes available.   Per secure chat with [facility] liaison - plan for family to sign consents at noon and transportation to be arranged shortly thereafter.   Objective Assessment: Vital Signs Vitals:   07/30/21 1600 07/31/21 0524  BP:  (!) 179/94  Pulse:  68  Resp:  18  Temp:  (!) 97.5 F (36.4 C)  SpO2: 99% 96%    Intake/Output Summary (Last 24 hours) at 07/31/2021 0915 Last data filed at 07/31/2021 0525 Gross per 24 hour  Intake 0 ml  Output 750 ml  Net -750 ml   Last Weight  Most recent update: 07/29/2021  6:43 AM    Weight  51.2 kg (112 lb 14 oz)            Gen: Elderly African-American female in no acute distress HEENT: Dry mucous membranes CV: Regular rate and rhythm PULM: On room air breathing is even and nonlabored ABD: soft/nontender EXT:  Distal extremities are cool to the touch Neuro: Somnolent  SUMMARY OF RECOMMENDATIONS   DNAR/DNI  Plan for transition to [facility] today-consents to be signed at noon per [facility] liaison and transportation to be arranged shortly after  At this time there are no need for modification of symptom medications though all opioid and controlled substances have been reviewed and deemed to be appropriate for symptom management  Ongoing palliative care support until discharge  Billing based on MDM: High ______________________________________________________________________________________   [place] Palliative Medicine Team Team Cell Phone: [phone] Please utilize secure chat with additional questions, if there is no response within 30 minutes please call the above phone number  Palliative Medicine Team providers are available by phone from 7am to 7pm daily and can be reached through the team cell phone.  Should this patient require assistance outside of these hours, please call the patient's attending physician.     

## 2021-07-31 NOTE — Plan of Care (Signed)
Patient ID: [name], female   DOB: Jun 16, 1935, 86 y.o.   MRN: [id number]  Problem: Education: Goal: Knowledge of General Education information will improve Description: Including pain rating scale, medication(s)/side effects and non-pharmacologic comfort measures Outcome: Adequate for Discharge   Problem: Health Behavior/Discharge Planning: Goal: Ability to manage health-related needs will improve Outcome: Adequate for Discharge   Problem: Clinical Measurements: Goal: Ability to maintain clinical measurements within normal limits will improve Outcome: Adequate for Discharge Goal: Will remain free from infection Outcome: Adequate for Discharge Goal: Diagnostic test results will improve Outcome: Adequate for Discharge Goal: Respiratory complications will improve Outcome: Adequate for Discharge Goal: Cardiovascular complication will be avoided Outcome: Adequate for Discharge   Problem: Activity: Goal: Risk for activity intolerance will decrease Outcome: Adequate for Discharge   Problem: Nutrition: Goal: Adequate nutrition will be maintained Outcome: Adequate for Discharge   Problem: Coping: Goal: Level of anxiety will decrease Outcome: Adequate for Discharge   Problem: Elimination: Goal: Will not experience complications related to bowel motility Outcome: Adequate for Discharge Goal: Will not experience complications related to urinary retention Outcome: Adequate for Discharge   Problem: Pain Managment: Goal: General experience of comfort will improve Outcome: Adequate for Discharge   Problem: Safety: Goal: Ability to remain free from injury will improve Outcome: Adequate for Discharge   Problem: Skin Integrity: Goal: Risk for impaired skin integrity will decrease Outcome: Adequate for Discharge   Problem: Acute Rehab OT Goals (only OT should resolve) Goal: Pt. Will Perform Lower Body Bathing Outcome: Adequate for Discharge Goal: Pt. Will Perform Lower Body  Dressing Outcome: Adequate for Discharge Goal: Pt. Will Transfer To Toilet Outcome: Adequate for Discharge Goal: OT Additional ADL Goal #1 Outcome: Adequate for Discharge   Problem: Acute Rehab PT Goals(only PT should resolve) Goal: Pt Will Go Sit To Supine/Side Outcome: Adequate for Discharge Goal: Pt Will Ambulate Outcome: Adequate for Discharge Goal: Pt Will Go Up/Down Stairs Outcome: Adequate for Discharge Goal: Pt/caregiver will Perform Home Exercise Program Outcome: Adequate for Discharge    Haydee Salter, RN

## 2021-08-24 DEATH — deceased
